# Patient Record
Sex: Female | Born: 1935 | Race: Black or African American | Hispanic: No | Smoking: Never smoker
Health system: Southern US, Community
[De-identification: ages and names within clinical notes are randomized; demographics above are authoritative.]

## PROBLEM LIST (undated history)

## (undated) DIAGNOSIS — J449 Chronic obstructive pulmonary disease, unspecified: Secondary | ICD-10-CM

## (undated) DIAGNOSIS — N183 Chronic kidney disease, stage 3 unspecified: Secondary | ICD-10-CM

## (undated) DIAGNOSIS — K229 Disease of esophagus, unspecified: Secondary | ICD-10-CM

## (undated) DIAGNOSIS — F329 Major depressive disorder, single episode, unspecified: Secondary | ICD-10-CM

## (undated) DIAGNOSIS — H409 Unspecified glaucoma: Secondary | ICD-10-CM

## (undated) DIAGNOSIS — J189 Pneumonia, unspecified organism: Secondary | ICD-10-CM

## (undated) DIAGNOSIS — J039 Acute tonsillitis, unspecified: Secondary | ICD-10-CM

## (undated) DIAGNOSIS — J45909 Unspecified asthma, uncomplicated: Secondary | ICD-10-CM

## (undated) DIAGNOSIS — N289 Disorder of kidney and ureter, unspecified: Secondary | ICD-10-CM

## (undated) DIAGNOSIS — I509 Heart failure, unspecified: Secondary | ICD-10-CM

## (undated) DIAGNOSIS — G20 Parkinson's disease: Secondary | ICD-10-CM

## (undated) DIAGNOSIS — M519 Unspecified thoracic, thoracolumbar and lumbosacral intervertebral disc disorder: Secondary | ICD-10-CM

## (undated) DIAGNOSIS — E119 Type 2 diabetes mellitus without complications: Secondary | ICD-10-CM

## (undated) DIAGNOSIS — E785 Hyperlipidemia, unspecified: Secondary | ICD-10-CM

## (undated) DIAGNOSIS — Z9989 Dependence on other enabling machines and devices: Secondary | ICD-10-CM

## (undated) DIAGNOSIS — I209 Angina pectoris, unspecified: Secondary | ICD-10-CM

## (undated) DIAGNOSIS — I214 Non-ST elevation (NSTEMI) myocardial infarction: Secondary | ICD-10-CM

## (undated) DIAGNOSIS — F32A Depression, unspecified: Secondary | ICD-10-CM

## (undated) DIAGNOSIS — I350 Nonrheumatic aortic (valve) stenosis: Secondary | ICD-10-CM

## (undated) DIAGNOSIS — K219 Gastro-esophageal reflux disease without esophagitis: Secondary | ICD-10-CM

## (undated) DIAGNOSIS — G4733 Obstructive sleep apnea (adult) (pediatric): Secondary | ICD-10-CM

## (undated) DIAGNOSIS — G20C Parkinsonism, unspecified: Secondary | ICD-10-CM

## (undated) DIAGNOSIS — R011 Cardiac murmur, unspecified: Secondary | ICD-10-CM

## (undated) DIAGNOSIS — M797 Fibromyalgia: Secondary | ICD-10-CM

## (undated) DIAGNOSIS — I447 Left bundle-branch block, unspecified: Secondary | ICD-10-CM

## (undated) DIAGNOSIS — N920 Excessive and frequent menstruation with regular cycle: Secondary | ICD-10-CM

## (undated) DIAGNOSIS — M1990 Unspecified osteoarthritis, unspecified site: Secondary | ICD-10-CM

## (undated) DIAGNOSIS — D649 Anemia, unspecified: Secondary | ICD-10-CM

## (undated) DIAGNOSIS — I10 Essential (primary) hypertension: Secondary | ICD-10-CM

## (undated) HISTORY — DX: Acute tonsillitis, unspecified: J03.90

## (undated) HISTORY — DX: Unspecified glaucoma: H40.9

## (undated) HISTORY — DX: Fibromyalgia: M79.7

## (undated) HISTORY — PX: DILATION AND CURETTAGE OF UTERUS: SHX78

## (undated) HISTORY — DX: Disease of esophagus, unspecified: K22.9

## (undated) HISTORY — PX: CARPAL TUNNEL RELEASE: SHX101

## (undated) HISTORY — DX: Essential (primary) hypertension: I10

## (undated) HISTORY — PX: LUMBAR LAMINECTOMY: SHX95

## (undated) HISTORY — DX: Parkinsonism, unspecified: G20.C

## (undated) HISTORY — DX: Unspecified thoracic, thoracolumbar and lumbosacral intervertebral disc disorder: M51.9

## (undated) HISTORY — DX: Unspecified asthma, uncomplicated: J45.909

## (undated) HISTORY — DX: Disorder of kidney and ureter, unspecified: N28.9

## (undated) HISTORY — DX: Hyperlipidemia, unspecified: E78.5

## (undated) HISTORY — PX: BACK SURGERY: SHX140

## (undated) HISTORY — DX: Nonrheumatic aortic (valve) stenosis: I35.0

## (undated) HISTORY — DX: Excessive and frequent menstruation with regular cycle: N92.0

## (undated) HISTORY — DX: Parkinson's disease: G20

## (undated) HISTORY — PX: CATARACT EXTRACTION W/ INTRAOCULAR LENS  IMPLANT, BILATERAL: SHX1307

---
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
## 1967-12-14 HISTORY — PX: ABDOMINAL HYSTERECTOMY: SHX81

## 2006-12-13 HISTORY — PX: COLONOSCOPY: SHX174

## 2007-12-14 DIAGNOSIS — H409 Unspecified glaucoma: Secondary | ICD-10-CM

## 2007-12-14 HISTORY — DX: Unspecified glaucoma: H40.9

## 2014-12-16 DIAGNOSIS — G4733 Obstructive sleep apnea (adult) (pediatric): Secondary | ICD-10-CM | POA: Diagnosis not present

## 2014-12-16 DIAGNOSIS — I4891 Unspecified atrial fibrillation: Secondary | ICD-10-CM | POA: Diagnosis not present

## 2014-12-16 DIAGNOSIS — H2512 Age-related nuclear cataract, left eye: Secondary | ICD-10-CM | POA: Diagnosis not present

## 2014-12-16 DIAGNOSIS — I209 Angina pectoris, unspecified: Secondary | ICD-10-CM | POA: Diagnosis not present

## 2014-12-17 DIAGNOSIS — H2512 Age-related nuclear cataract, left eye: Secondary | ICD-10-CM | POA: Diagnosis not present

## 2015-01-08 DIAGNOSIS — E119 Type 2 diabetes mellitus without complications: Secondary | ICD-10-CM | POA: Diagnosis not present

## 2015-01-08 DIAGNOSIS — M545 Low back pain: Secondary | ICD-10-CM | POA: Diagnosis not present

## 2015-01-08 DIAGNOSIS — M5126 Other intervertebral disc displacement, lumbar region: Secondary | ICD-10-CM | POA: Diagnosis not present

## 2015-01-08 DIAGNOSIS — I129 Hypertensive chronic kidney disease with stage 1 through stage 4 chronic kidney disease, or unspecified chronic kidney disease: Secondary | ICD-10-CM | POA: Diagnosis not present

## 2015-01-08 DIAGNOSIS — M4126 Other idiopathic scoliosis, lumbar region: Secondary | ICD-10-CM | POA: Diagnosis not present

## 2015-01-08 DIAGNOSIS — M47816 Spondylosis without myelopathy or radiculopathy, lumbar region: Secondary | ICD-10-CM | POA: Diagnosis not present

## 2015-01-08 DIAGNOSIS — M5416 Radiculopathy, lumbar region: Secondary | ICD-10-CM | POA: Diagnosis not present

## 2015-01-08 DIAGNOSIS — N183 Chronic kidney disease, stage 3 (moderate): Secondary | ICD-10-CM | POA: Diagnosis not present

## 2015-01-08 DIAGNOSIS — N189 Chronic kidney disease, unspecified: Secondary | ICD-10-CM | POA: Diagnosis not present

## 2015-01-29 DIAGNOSIS — M5126 Other intervertebral disc displacement, lumbar region: Secondary | ICD-10-CM | POA: Diagnosis not present

## 2015-01-29 DIAGNOSIS — M545 Low back pain: Secondary | ICD-10-CM | POA: Diagnosis not present

## 2015-01-31 DIAGNOSIS — M5126 Other intervertebral disc displacement, lumbar region: Secondary | ICD-10-CM | POA: Diagnosis not present

## 2015-01-31 DIAGNOSIS — M545 Low back pain: Secondary | ICD-10-CM | POA: Diagnosis not present

## 2015-02-03 DIAGNOSIS — I10 Essential (primary) hypertension: Secondary | ICD-10-CM | POA: Diagnosis not present

## 2015-02-03 DIAGNOSIS — E119 Type 2 diabetes mellitus without complications: Secondary | ICD-10-CM | POA: Diagnosis not present

## 2015-02-04 DIAGNOSIS — I10 Essential (primary) hypertension: Secondary | ICD-10-CM | POA: Diagnosis not present

## 2015-02-04 DIAGNOSIS — Z0000 Encounter for general adult medical examination without abnormal findings: Secondary | ICD-10-CM | POA: Diagnosis not present

## 2015-02-04 DIAGNOSIS — E1122 Type 2 diabetes mellitus with diabetic chronic kidney disease: Secondary | ICD-10-CM | POA: Diagnosis not present

## 2015-02-04 DIAGNOSIS — E780 Pure hypercholesterolemia: Secondary | ICD-10-CM | POA: Diagnosis not present

## 2015-02-04 DIAGNOSIS — G4710 Hypersomnia, unspecified: Secondary | ICD-10-CM | POA: Diagnosis not present

## 2015-02-05 DIAGNOSIS — M545 Low back pain: Secondary | ICD-10-CM | POA: Diagnosis not present

## 2015-02-05 DIAGNOSIS — M5126 Other intervertebral disc displacement, lumbar region: Secondary | ICD-10-CM | POA: Diagnosis not present

## 2015-02-07 DIAGNOSIS — M5126 Other intervertebral disc displacement, lumbar region: Secondary | ICD-10-CM | POA: Diagnosis not present

## 2015-02-07 DIAGNOSIS — M545 Low back pain: Secondary | ICD-10-CM | POA: Diagnosis not present

## 2015-02-11 DIAGNOSIS — M5126 Other intervertebral disc displacement, lumbar region: Secondary | ICD-10-CM | POA: Diagnosis not present

## 2015-02-11 DIAGNOSIS — M545 Low back pain: Secondary | ICD-10-CM | POA: Diagnosis not present

## 2015-02-13 DIAGNOSIS — M5126 Other intervertebral disc displacement, lumbar region: Secondary | ICD-10-CM | POA: Diagnosis not present

## 2015-02-13 DIAGNOSIS — M545 Low back pain: Secondary | ICD-10-CM | POA: Diagnosis not present

## 2015-02-17 DIAGNOSIS — M545 Low back pain: Secondary | ICD-10-CM | POA: Diagnosis not present

## 2015-02-17 DIAGNOSIS — M5126 Other intervertebral disc displacement, lumbar region: Secondary | ICD-10-CM | POA: Diagnosis not present

## 2015-02-18 DIAGNOSIS — H2513 Age-related nuclear cataract, bilateral: Secondary | ICD-10-CM | POA: Diagnosis not present

## 2015-02-20 DIAGNOSIS — M170 Bilateral primary osteoarthritis of knee: Secondary | ICD-10-CM | POA: Diagnosis not present

## 2015-02-20 DIAGNOSIS — G5600 Carpal tunnel syndrome, unspecified upper limb: Secondary | ICD-10-CM | POA: Diagnosis not present

## 2015-02-21 DIAGNOSIS — M5126 Other intervertebral disc displacement, lumbar region: Secondary | ICD-10-CM | POA: Diagnosis not present

## 2015-02-21 DIAGNOSIS — M545 Low back pain: Secondary | ICD-10-CM | POA: Diagnosis not present

## 2015-03-07 DIAGNOSIS — M47816 Spondylosis without myelopathy or radiculopathy, lumbar region: Secondary | ICD-10-CM | POA: Diagnosis not present

## 2015-03-07 DIAGNOSIS — M5416 Radiculopathy, lumbar region: Secondary | ICD-10-CM | POA: Diagnosis not present

## 2015-03-07 DIAGNOSIS — M545 Low back pain: Secondary | ICD-10-CM | POA: Diagnosis not present

## 2015-03-07 DIAGNOSIS — M4126 Other idiopathic scoliosis, lumbar region: Secondary | ICD-10-CM | POA: Diagnosis not present

## 2015-03-07 DIAGNOSIS — M5126 Other intervertebral disc displacement, lumbar region: Secondary | ICD-10-CM | POA: Diagnosis not present

## 2015-03-17 DIAGNOSIS — H4011X2 Primary open-angle glaucoma, moderate stage: Secondary | ICD-10-CM | POA: Diagnosis not present

## 2015-03-17 DIAGNOSIS — H04123 Dry eye syndrome of bilateral lacrimal glands: Secondary | ICD-10-CM | POA: Diagnosis not present

## 2015-06-23 DIAGNOSIS — H4011X2 Primary open-angle glaucoma, moderate stage: Secondary | ICD-10-CM | POA: Diagnosis not present

## 2015-08-05 DIAGNOSIS — E780 Pure hypercholesterolemia: Secondary | ICD-10-CM | POA: Diagnosis not present

## 2015-08-05 DIAGNOSIS — E1122 Type 2 diabetes mellitus with diabetic chronic kidney disease: Secondary | ICD-10-CM | POA: Diagnosis not present

## 2015-08-05 DIAGNOSIS — I10 Essential (primary) hypertension: Secondary | ICD-10-CM | POA: Diagnosis not present

## 2015-08-05 DIAGNOSIS — N289 Disorder of kidney and ureter, unspecified: Secondary | ICD-10-CM | POA: Diagnosis not present

## 2015-08-08 DIAGNOSIS — I272 Other secondary pulmonary hypertension: Secondary | ICD-10-CM | POA: Diagnosis not present

## 2015-08-08 DIAGNOSIS — R0602 Shortness of breath: Secondary | ICD-10-CM | POA: Diagnosis not present

## 2015-08-08 DIAGNOSIS — E785 Hyperlipidemia, unspecified: Secondary | ICD-10-CM | POA: Diagnosis not present

## 2015-08-08 DIAGNOSIS — I10 Essential (primary) hypertension: Secondary | ICD-10-CM | POA: Diagnosis not present

## 2015-08-08 DIAGNOSIS — E1129 Type 2 diabetes mellitus with other diabetic kidney complication: Secondary | ICD-10-CM | POA: Diagnosis not present

## 2015-08-11 DIAGNOSIS — R931 Abnormal findings on diagnostic imaging of heart and coronary circulation: Secondary | ICD-10-CM | POA: Diagnosis not present

## 2015-08-11 DIAGNOSIS — R0602 Shortness of breath: Secondary | ICD-10-CM | POA: Diagnosis not present

## 2015-09-26 DIAGNOSIS — Z23 Encounter for immunization: Secondary | ICD-10-CM | POA: Diagnosis not present

## 2015-10-17 DIAGNOSIS — H401132 Primary open-angle glaucoma, bilateral, moderate stage: Secondary | ICD-10-CM | POA: Diagnosis not present

## 2015-10-17 DIAGNOSIS — H47233 Glaucomatous optic atrophy, bilateral: Secondary | ICD-10-CM | POA: Diagnosis not present

## 2015-10-20 DIAGNOSIS — E119 Type 2 diabetes mellitus without complications: Secondary | ICD-10-CM | POA: Diagnosis not present

## 2015-10-20 DIAGNOSIS — H401132 Primary open-angle glaucoma, bilateral, moderate stage: Secondary | ICD-10-CM | POA: Diagnosis not present

## 2015-10-31 DIAGNOSIS — D649 Anemia, unspecified: Secondary | ICD-10-CM | POA: Diagnosis not present

## 2015-10-31 DIAGNOSIS — E119 Type 2 diabetes mellitus without complications: Secondary | ICD-10-CM | POA: Diagnosis not present

## 2015-10-31 DIAGNOSIS — I129 Hypertensive chronic kidney disease with stage 1 through stage 4 chronic kidney disease, or unspecified chronic kidney disease: Secondary | ICD-10-CM | POA: Diagnosis not present

## 2015-10-31 DIAGNOSIS — N183 Chronic kidney disease, stage 3 (moderate): Secondary | ICD-10-CM | POA: Diagnosis not present

## 2015-11-12 DIAGNOSIS — E782 Mixed hyperlipidemia: Secondary | ICD-10-CM | POA: Diagnosis not present

## 2015-11-12 DIAGNOSIS — I129 Hypertensive chronic kidney disease with stage 1 through stage 4 chronic kidney disease, or unspecified chronic kidney disease: Secondary | ICD-10-CM | POA: Diagnosis not present

## 2015-11-12 DIAGNOSIS — E559 Vitamin D deficiency, unspecified: Secondary | ICD-10-CM | POA: Diagnosis not present

## 2015-11-12 DIAGNOSIS — M519 Unspecified thoracic, thoracolumbar and lumbosacral intervertebral disc disorder: Secondary | ICD-10-CM | POA: Diagnosis not present

## 2015-11-12 DIAGNOSIS — N183 Chronic kidney disease, stage 3 (moderate): Secondary | ICD-10-CM | POA: Diagnosis not present

## 2015-11-12 DIAGNOSIS — D649 Anemia, unspecified: Secondary | ICD-10-CM | POA: Diagnosis not present

## 2015-11-12 DIAGNOSIS — E119 Type 2 diabetes mellitus without complications: Secondary | ICD-10-CM | POA: Diagnosis not present

## 2015-11-12 DIAGNOSIS — N189 Chronic kidney disease, unspecified: Secondary | ICD-10-CM | POA: Diagnosis not present

## 2015-11-14 DIAGNOSIS — H26493 Other secondary cataract, bilateral: Secondary | ICD-10-CM | POA: Diagnosis not present

## 2015-11-14 DIAGNOSIS — H401130 Primary open-angle glaucoma, bilateral, stage unspecified: Secondary | ICD-10-CM | POA: Diagnosis not present

## 2015-11-14 DIAGNOSIS — E119 Type 2 diabetes mellitus without complications: Secondary | ICD-10-CM | POA: Diagnosis not present

## 2015-11-14 DIAGNOSIS — H35373 Puckering of macula, bilateral: Secondary | ICD-10-CM | POA: Diagnosis not present

## 2015-11-25 DIAGNOSIS — J4550 Severe persistent asthma, uncomplicated: Secondary | ICD-10-CM | POA: Diagnosis not present

## 2015-11-25 DIAGNOSIS — R5383 Other fatigue: Secondary | ICD-10-CM | POA: Diagnosis not present

## 2015-11-25 DIAGNOSIS — G4733 Obstructive sleep apnea (adult) (pediatric): Secondary | ICD-10-CM | POA: Diagnosis not present

## 2015-12-01 DIAGNOSIS — H26493 Other secondary cataract, bilateral: Secondary | ICD-10-CM | POA: Diagnosis not present

## 2015-12-18 DIAGNOSIS — Z1321 Encounter for screening for nutritional disorder: Secondary | ICD-10-CM | POA: Diagnosis not present

## 2015-12-18 DIAGNOSIS — E559 Vitamin D deficiency, unspecified: Secondary | ICD-10-CM | POA: Diagnosis not present

## 2015-12-18 DIAGNOSIS — R9431 Abnormal electrocardiogram [ECG] [EKG]: Secondary | ICD-10-CM | POA: Diagnosis not present

## 2015-12-18 DIAGNOSIS — R5383 Other fatigue: Secondary | ICD-10-CM | POA: Diagnosis not present

## 2015-12-18 DIAGNOSIS — F3289 Other specified depressive episodes: Secondary | ICD-10-CM | POA: Diagnosis not present

## 2016-01-02 DIAGNOSIS — E559 Vitamin D deficiency, unspecified: Secondary | ICD-10-CM | POA: Diagnosis not present

## 2016-01-02 DIAGNOSIS — N183 Chronic kidney disease, stage 3 (moderate): Secondary | ICD-10-CM | POA: Diagnosis not present

## 2016-01-02 DIAGNOSIS — E119 Type 2 diabetes mellitus without complications: Secondary | ICD-10-CM | POA: Diagnosis not present

## 2016-01-07 DIAGNOSIS — N189 Chronic kidney disease, unspecified: Secondary | ICD-10-CM | POA: Diagnosis not present

## 2016-01-07 DIAGNOSIS — N183 Chronic kidney disease, stage 3 (moderate): Secondary | ICD-10-CM | POA: Diagnosis not present

## 2016-01-07 DIAGNOSIS — I129 Hypertensive chronic kidney disease with stage 1 through stage 4 chronic kidney disease, or unspecified chronic kidney disease: Secondary | ICD-10-CM | POA: Diagnosis not present

## 2016-01-07 DIAGNOSIS — E119 Type 2 diabetes mellitus without complications: Secondary | ICD-10-CM | POA: Diagnosis not present

## 2016-02-09 DIAGNOSIS — N289 Disorder of kidney and ureter, unspecified: Secondary | ICD-10-CM | POA: Diagnosis not present

## 2016-02-09 DIAGNOSIS — I10 Essential (primary) hypertension: Secondary | ICD-10-CM | POA: Diagnosis not present

## 2016-02-09 DIAGNOSIS — E785 Hyperlipidemia, unspecified: Secondary | ICD-10-CM | POA: Diagnosis not present

## 2016-02-10 DIAGNOSIS — E7800 Pure hypercholesterolemia, unspecified: Secondary | ICD-10-CM | POA: Diagnosis not present

## 2016-02-10 DIAGNOSIS — D638 Anemia in other chronic diseases classified elsewhere: Secondary | ICD-10-CM | POA: Diagnosis not present

## 2016-02-10 DIAGNOSIS — Z0000 Encounter for general adult medical examination without abnormal findings: Secondary | ICD-10-CM | POA: Diagnosis not present

## 2016-02-10 DIAGNOSIS — I10 Essential (primary) hypertension: Secondary | ICD-10-CM | POA: Diagnosis not present

## 2016-02-10 DIAGNOSIS — M79671 Pain in right foot: Secondary | ICD-10-CM | POA: Diagnosis not present

## 2016-02-10 DIAGNOSIS — S9031XA Contusion of right foot, initial encounter: Secondary | ICD-10-CM | POA: Diagnosis not present

## 2016-02-10 DIAGNOSIS — Z6828 Body mass index (BMI) 28.0-28.9, adult: Secondary | ICD-10-CM | POA: Diagnosis not present

## 2016-02-10 DIAGNOSIS — E119 Type 2 diabetes mellitus without complications: Secondary | ICD-10-CM | POA: Diagnosis not present

## 2016-02-10 DIAGNOSIS — S99921A Unspecified injury of right foot, initial encounter: Secondary | ICD-10-CM | POA: Diagnosis not present

## 2016-02-10 DIAGNOSIS — I350 Nonrheumatic aortic (valve) stenosis: Secondary | ICD-10-CM | POA: Diagnosis not present

## 2016-02-10 DIAGNOSIS — N289 Disorder of kidney and ureter, unspecified: Secondary | ICD-10-CM | POA: Diagnosis not present

## 2016-03-08 DIAGNOSIS — H401132 Primary open-angle glaucoma, bilateral, moderate stage: Secondary | ICD-10-CM | POA: Diagnosis not present

## 2016-03-09 DIAGNOSIS — Z23 Encounter for immunization: Secondary | ICD-10-CM | POA: Diagnosis not present

## 2016-03-09 DIAGNOSIS — R5383 Other fatigue: Secondary | ICD-10-CM | POA: Diagnosis not present

## 2016-03-09 DIAGNOSIS — G4733 Obstructive sleep apnea (adult) (pediatric): Secondary | ICD-10-CM | POA: Diagnosis not present

## 2016-03-09 DIAGNOSIS — R0602 Shortness of breath: Secondary | ICD-10-CM | POA: Diagnosis not present

## 2016-03-09 DIAGNOSIS — J4540 Moderate persistent asthma, uncomplicated: Secondary | ICD-10-CM | POA: Diagnosis not present

## 2016-04-30 DIAGNOSIS — E559 Vitamin D deficiency, unspecified: Secondary | ICD-10-CM | POA: Diagnosis not present

## 2016-04-30 DIAGNOSIS — N183 Chronic kidney disease, stage 3 (moderate): Secondary | ICD-10-CM | POA: Diagnosis not present

## 2016-04-30 DIAGNOSIS — E119 Type 2 diabetes mellitus without complications: Secondary | ICD-10-CM | POA: Diagnosis not present

## 2016-05-05 DIAGNOSIS — N183 Chronic kidney disease, stage 3 (moderate): Secondary | ICD-10-CM | POA: Diagnosis not present

## 2016-05-05 DIAGNOSIS — I129 Hypertensive chronic kidney disease with stage 1 through stage 4 chronic kidney disease, or unspecified chronic kidney disease: Secondary | ICD-10-CM | POA: Diagnosis not present

## 2016-05-05 DIAGNOSIS — E119 Type 2 diabetes mellitus without complications: Secondary | ICD-10-CM | POA: Diagnosis not present

## 2016-05-05 DIAGNOSIS — N189 Chronic kidney disease, unspecified: Secondary | ICD-10-CM | POA: Diagnosis not present

## 2016-06-02 DIAGNOSIS — E119 Type 2 diabetes mellitus without complications: Secondary | ICD-10-CM | POA: Diagnosis not present

## 2016-06-02 DIAGNOSIS — I272 Other secondary pulmonary hypertension: Secondary | ICD-10-CM | POA: Diagnosis not present

## 2016-06-02 DIAGNOSIS — I110 Hypertensive heart disease with heart failure: Secondary | ICD-10-CM | POA: Diagnosis not present

## 2016-06-02 DIAGNOSIS — D649 Anemia, unspecified: Secondary | ICD-10-CM | POA: Diagnosis not present

## 2016-06-02 DIAGNOSIS — E785 Hyperlipidemia, unspecified: Secondary | ICD-10-CM | POA: Diagnosis not present

## 2016-06-02 DIAGNOSIS — Z6828 Body mass index (BMI) 28.0-28.9, adult: Secondary | ICD-10-CM | POA: Diagnosis not present

## 2016-06-02 DIAGNOSIS — N289 Disorder of kidney and ureter, unspecified: Secondary | ICD-10-CM | POA: Diagnosis not present

## 2016-06-04 DIAGNOSIS — I110 Hypertensive heart disease with heart failure: Secondary | ICD-10-CM | POA: Diagnosis not present

## 2016-06-04 DIAGNOSIS — R601 Generalized edema: Secondary | ICD-10-CM | POA: Diagnosis not present

## 2016-06-17 DIAGNOSIS — D649 Anemia, unspecified: Secondary | ICD-10-CM | POA: Diagnosis not present

## 2016-06-23 DIAGNOSIS — Z87448 Personal history of other diseases of urinary system: Secondary | ICD-10-CM | POA: Diagnosis not present

## 2016-06-23 DIAGNOSIS — Z8739 Personal history of other diseases of the musculoskeletal system and connective tissue: Secondary | ICD-10-CM | POA: Diagnosis not present

## 2016-06-23 DIAGNOSIS — D649 Anemia, unspecified: Secondary | ICD-10-CM | POA: Diagnosis not present

## 2016-07-06 DIAGNOSIS — H401132 Primary open-angle glaucoma, bilateral, moderate stage: Secondary | ICD-10-CM | POA: Diagnosis not present

## 2016-07-06 DIAGNOSIS — H04123 Dry eye syndrome of bilateral lacrimal glands: Secondary | ICD-10-CM | POA: Diagnosis not present

## 2016-07-06 DIAGNOSIS — R931 Abnormal findings on diagnostic imaging of heart and coronary circulation: Secondary | ICD-10-CM | POA: Diagnosis not present

## 2016-07-06 DIAGNOSIS — E1169 Type 2 diabetes mellitus with other specified complication: Secondary | ICD-10-CM | POA: Diagnosis not present

## 2016-08-13 DIAGNOSIS — D649 Anemia, unspecified: Secondary | ICD-10-CM | POA: Diagnosis not present

## 2016-08-18 DIAGNOSIS — D649 Anemia, unspecified: Secondary | ICD-10-CM | POA: Diagnosis not present

## 2016-09-14 DIAGNOSIS — J4550 Severe persistent asthma, uncomplicated: Secondary | ICD-10-CM | POA: Diagnosis not present

## 2016-09-14 DIAGNOSIS — G4733 Obstructive sleep apnea (adult) (pediatric): Secondary | ICD-10-CM | POA: Diagnosis not present

## 2016-09-14 DIAGNOSIS — R0989 Other specified symptoms and signs involving the circulatory and respiratory systems: Secondary | ICD-10-CM | POA: Diagnosis not present

## 2016-09-14 DIAGNOSIS — Z23 Encounter for immunization: Secondary | ICD-10-CM | POA: Diagnosis not present

## 2016-09-14 DIAGNOSIS — M25552 Pain in left hip: Secondary | ICD-10-CM | POA: Diagnosis not present

## 2016-09-14 DIAGNOSIS — I509 Heart failure, unspecified: Secondary | ICD-10-CM | POA: Diagnosis not present

## 2016-09-14 DIAGNOSIS — M79672 Pain in left foot: Secondary | ICD-10-CM | POA: Diagnosis not present

## 2016-09-14 DIAGNOSIS — M1990 Unspecified osteoarthritis, unspecified site: Secondary | ICD-10-CM | POA: Diagnosis not present

## 2016-09-16 DIAGNOSIS — E119 Type 2 diabetes mellitus without complications: Secondary | ICD-10-CM | POA: Diagnosis not present

## 2016-09-16 DIAGNOSIS — E782 Mixed hyperlipidemia: Secondary | ICD-10-CM | POA: Diagnosis not present

## 2016-09-16 DIAGNOSIS — D638 Anemia in other chronic diseases classified elsewhere: Secondary | ICD-10-CM | POA: Diagnosis not present

## 2016-09-16 DIAGNOSIS — I10 Essential (primary) hypertension: Secondary | ICD-10-CM | POA: Diagnosis not present

## 2016-09-16 LAB — BASIC METABOLIC PANEL
BUN: 53 — AB (ref 4–21)
CREATININE: 1.6 — AB (ref 0.5–1.1)
GLUCOSE: 88
Potassium: 3.9 (ref 3.4–5.3)
SODIUM: 139 (ref 137–147)

## 2016-09-16 LAB — LIPID PANEL
Cholesterol: 143 (ref 0–200)
HDL: 2 — AB (ref 35–70)
LDL CALC: 73
Triglycerides: 44 (ref 40–160)

## 2016-09-16 LAB — CBC AND DIFFERENTIAL
HEMATOCRIT: 32 — AB (ref 36–46)
Hemoglobin: 10.1 — AB (ref 12.0–16.0)
Platelets: 186 (ref 150–399)
WBC: 10

## 2016-09-16 LAB — HEPATIC FUNCTION PANEL
ALK PHOS: 124 (ref 25–125)
ALT: 24 (ref 7–35)
AST: 28 (ref 13–35)
BILIRUBIN, TOTAL: 0.3

## 2016-09-16 LAB — HEMOGLOBIN A1C: Hemoglobin A1C: 5.7

## 2016-09-17 DIAGNOSIS — R0602 Shortness of breath: Secondary | ICD-10-CM | POA: Diagnosis not present

## 2016-09-17 DIAGNOSIS — M25552 Pain in left hip: Secondary | ICD-10-CM | POA: Diagnosis not present

## 2016-09-17 DIAGNOSIS — N289 Disorder of kidney and ureter, unspecified: Secondary | ICD-10-CM | POA: Diagnosis not present

## 2016-09-17 DIAGNOSIS — Z6828 Body mass index (BMI) 28.0-28.9, adult: Secondary | ICD-10-CM | POA: Diagnosis not present

## 2016-09-17 DIAGNOSIS — I10 Essential (primary) hypertension: Secondary | ICD-10-CM | POA: Diagnosis not present

## 2016-09-17 DIAGNOSIS — R601 Generalized edema: Secondary | ICD-10-CM | POA: Diagnosis not present

## 2016-09-17 DIAGNOSIS — I272 Pulmonary hypertension, unspecified: Secondary | ICD-10-CM | POA: Diagnosis not present

## 2016-09-17 DIAGNOSIS — Z0130 Encounter for examination of blood pressure without abnormal findings: Secondary | ICD-10-CM | POA: Diagnosis not present

## 2016-09-17 DIAGNOSIS — E119 Type 2 diabetes mellitus without complications: Secondary | ICD-10-CM | POA: Diagnosis not present

## 2016-09-17 DIAGNOSIS — M25562 Pain in left knee: Secondary | ICD-10-CM | POA: Diagnosis not present

## 2016-09-17 DIAGNOSIS — I110 Hypertensive heart disease with heart failure: Secondary | ICD-10-CM | POA: Diagnosis not present

## 2016-09-17 DIAGNOSIS — E785 Hyperlipidemia, unspecified: Secondary | ICD-10-CM | POA: Diagnosis not present

## 2016-09-24 DIAGNOSIS — R601 Generalized edema: Secondary | ICD-10-CM | POA: Diagnosis not present

## 2016-09-24 DIAGNOSIS — I110 Hypertensive heart disease with heart failure: Secondary | ICD-10-CM | POA: Diagnosis not present

## 2016-10-15 DIAGNOSIS — H401132 Primary open-angle glaucoma, bilateral, moderate stage: Secondary | ICD-10-CM | POA: Diagnosis not present

## 2016-10-15 DIAGNOSIS — H04123 Dry eye syndrome of bilateral lacrimal glands: Secondary | ICD-10-CM | POA: Diagnosis not present

## 2016-11-29 ENCOUNTER — Ambulatory Visit: Payer: Self-pay | Admitting: Family Medicine

## 2016-12-02 ENCOUNTER — Ambulatory Visit: Payer: Self-pay | Admitting: Family Medicine

## 2017-01-01 ENCOUNTER — Inpatient Hospital Stay (HOSPITAL_COMMUNITY)
Admission: EM | Admit: 2017-01-01 | Discharge: 2017-01-08 | DRG: 280 | Disposition: A | Discharge status: Skilled Nursing Facility | Payer: Medicare Other | Attending: Internal Medicine | Admitting: Internal Medicine

## 2017-01-01 ENCOUNTER — Emergency Department (HOSPITAL_COMMUNITY): Payer: Medicare Other

## 2017-01-01 ENCOUNTER — Inpatient Hospital Stay (HOSPITAL_COMMUNITY): Payer: Medicare Other

## 2017-01-01 ENCOUNTER — Encounter (HOSPITAL_COMMUNITY): Payer: Self-pay | Admitting: Family Medicine

## 2017-01-01 DIAGNOSIS — D72829 Elevated white blood cell count, unspecified: Secondary | ICD-10-CM | POA: Diagnosis present

## 2017-01-01 DIAGNOSIS — I425 Other restrictive cardiomyopathy: Secondary | ICD-10-CM | POA: Diagnosis present

## 2017-01-01 DIAGNOSIS — I2729 Other secondary pulmonary hypertension: Secondary | ICD-10-CM | POA: Diagnosis present

## 2017-01-01 DIAGNOSIS — N179 Acute kidney failure, unspecified: Secondary | ICD-10-CM | POA: Diagnosis present

## 2017-01-01 DIAGNOSIS — E872 Acidosis: Secondary | ICD-10-CM | POA: Diagnosis present

## 2017-01-01 DIAGNOSIS — I214 Non-ST elevation (NSTEMI) myocardial infarction: Principal | ICD-10-CM | POA: Diagnosis present

## 2017-01-01 DIAGNOSIS — I471 Supraventricular tachycardia: Secondary | ICD-10-CM | POA: Diagnosis not present

## 2017-01-01 DIAGNOSIS — R069 Unspecified abnormalities of breathing: Secondary | ICD-10-CM | POA: Diagnosis not present

## 2017-01-01 DIAGNOSIS — J189 Pneumonia, unspecified organism: Secondary | ICD-10-CM | POA: Diagnosis not present

## 2017-01-01 DIAGNOSIS — R0600 Dyspnea, unspecified: Secondary | ICD-10-CM

## 2017-01-01 DIAGNOSIS — E785 Hyperlipidemia, unspecified: Secondary | ICD-10-CM | POA: Diagnosis present

## 2017-01-01 DIAGNOSIS — D519 Vitamin B12 deficiency anemia, unspecified: Secondary | ICD-10-CM | POA: Diagnosis present

## 2017-01-01 DIAGNOSIS — I371 Nonrheumatic pulmonary valve insufficiency: Secondary | ICD-10-CM | POA: Diagnosis present

## 2017-01-01 DIAGNOSIS — I081 Rheumatic disorders of both mitral and tricuspid valves: Secondary | ICD-10-CM | POA: Diagnosis present

## 2017-01-01 DIAGNOSIS — E1122 Type 2 diabetes mellitus with diabetic chronic kidney disease: Secondary | ICD-10-CM | POA: Diagnosis present

## 2017-01-01 DIAGNOSIS — Z79899 Other long term (current) drug therapy: Secondary | ICD-10-CM

## 2017-01-01 DIAGNOSIS — R748 Abnormal levels of other serum enzymes: Secondary | ICD-10-CM | POA: Diagnosis not present

## 2017-01-01 DIAGNOSIS — Z7982 Long term (current) use of aspirin: Secondary | ICD-10-CM

## 2017-01-01 DIAGNOSIS — I272 Pulmonary hypertension, unspecified: Secondary | ICD-10-CM | POA: Diagnosis present

## 2017-01-01 DIAGNOSIS — I447 Left bundle-branch block, unspecified: Secondary | ICD-10-CM | POA: Diagnosis present

## 2017-01-01 DIAGNOSIS — Z87891 Personal history of nicotine dependence: Secondary | ICD-10-CM

## 2017-01-01 DIAGNOSIS — R140 Abdominal distension (gaseous): Secondary | ICD-10-CM | POA: Diagnosis not present

## 2017-01-01 DIAGNOSIS — R7989 Other specified abnormal findings of blood chemistry: Secondary | ICD-10-CM

## 2017-01-01 DIAGNOSIS — I5082 Biventricular heart failure: Secondary | ICD-10-CM | POA: Diagnosis present

## 2017-01-01 DIAGNOSIS — R042 Hemoptysis: Secondary | ICD-10-CM | POA: Diagnosis not present

## 2017-01-01 DIAGNOSIS — I5041 Acute combined systolic (congestive) and diastolic (congestive) heart failure: Secondary | ICD-10-CM | POA: Diagnosis present

## 2017-01-01 DIAGNOSIS — R0602 Shortness of breath: Secondary | ICD-10-CM | POA: Diagnosis not present

## 2017-01-01 DIAGNOSIS — N183 Chronic kidney disease, stage 3 (moderate): Secondary | ICD-10-CM | POA: Diagnosis present

## 2017-01-01 DIAGNOSIS — Z794 Long term (current) use of insulin: Secondary | ICD-10-CM

## 2017-01-01 DIAGNOSIS — I130 Hypertensive heart and chronic kidney disease with heart failure and stage 1 through stage 4 chronic kidney disease, or unspecified chronic kidney disease: Secondary | ICD-10-CM | POA: Diagnosis not present

## 2017-01-01 DIAGNOSIS — I420 Dilated cardiomyopathy: Secondary | ICD-10-CM | POA: Diagnosis present

## 2017-01-01 DIAGNOSIS — N189 Chronic kidney disease, unspecified: Secondary | ICD-10-CM | POA: Diagnosis not present

## 2017-01-01 DIAGNOSIS — J441 Chronic obstructive pulmonary disease with (acute) exacerbation: Secondary | ICD-10-CM | POA: Diagnosis not present

## 2017-01-01 DIAGNOSIS — I119 Hypertensive heart disease without heart failure: Secondary | ICD-10-CM | POA: Diagnosis present

## 2017-01-01 DIAGNOSIS — J9601 Acute respiratory failure with hypoxia: Secondary | ICD-10-CM | POA: Diagnosis present

## 2017-01-01 DIAGNOSIS — R778 Other specified abnormalities of plasma proteins: Secondary | ICD-10-CM | POA: Diagnosis present

## 2017-01-01 DIAGNOSIS — J9602 Acute respiratory failure with hypercapnia: Secondary | ICD-10-CM | POA: Diagnosis not present

## 2017-01-01 DIAGNOSIS — I252 Old myocardial infarction: Secondary | ICD-10-CM

## 2017-01-01 DIAGNOSIS — E876 Hypokalemia: Secondary | ICD-10-CM | POA: Diagnosis present

## 2017-01-01 DIAGNOSIS — J9600 Acute respiratory failure, unspecified whether with hypoxia or hypercapnia: Secondary | ICD-10-CM | POA: Diagnosis not present

## 2017-01-01 DIAGNOSIS — I255 Ischemic cardiomyopathy: Secondary | ICD-10-CM | POA: Diagnosis present

## 2017-01-01 DIAGNOSIS — J9690 Respiratory failure, unspecified, unspecified whether with hypoxia or hypercapnia: Secondary | ICD-10-CM | POA: Diagnosis not present

## 2017-01-01 DIAGNOSIS — R339 Retention of urine, unspecified: Secondary | ICD-10-CM | POA: Diagnosis present

## 2017-01-01 DIAGNOSIS — R1310 Dysphagia, unspecified: Secondary | ICD-10-CM | POA: Diagnosis not present

## 2017-01-01 HISTORY — DX: Heart failure, unspecified: I50.9

## 2017-01-01 HISTORY — DX: Chronic obstructive pulmonary disease, unspecified: J44.9

## 2017-01-01 HISTORY — DX: Left bundle-branch block, unspecified: I44.7

## 2017-01-01 LAB — RESPIRATORY PANEL BY PCR
ADENOVIRUS-RVPPCR: NOT DETECTED
Bordetella pertussis: NOT DETECTED
CHLAMYDOPHILA PNEUMONIAE-RVPPCR: NOT DETECTED
CORONAVIRUS HKU1-RVPPCR: NOT DETECTED
CORONAVIRUS NL63-RVPPCR: NOT DETECTED
CORONAVIRUS OC43-RVPPCR: NOT DETECTED
Coronavirus 229E: NOT DETECTED
Influenza A: NOT DETECTED
Influenza B: NOT DETECTED
METAPNEUMOVIRUS-RVPPCR: NOT DETECTED
Mycoplasma pneumoniae: NOT DETECTED
PARAINFLUENZA VIRUS 1-RVPPCR: NOT DETECTED
PARAINFLUENZA VIRUS 2-RVPPCR: NOT DETECTED
PARAINFLUENZA VIRUS 3-RVPPCR: NOT DETECTED
PARAINFLUENZA VIRUS 4-RVPPCR: NOT DETECTED
RHINOVIRUS / ENTEROVIRUS - RVPPCR: NOT DETECTED
Respiratory Syncytial Virus: NOT DETECTED

## 2017-01-01 LAB — I-STAT ARTERIAL BLOOD GAS, ED
ACID-BASE DEFICIT: 4 mmol/L — AB (ref 0.0–2.0)
ACID-BASE DEFICIT: 3 mmol/L — AB (ref 0.0–2.0)
Acid-base deficit: 1 mmol/L (ref 0.0–2.0)
Acid-base deficit: 1 mmol/L (ref 0.0–2.0)
BICARBONATE: 27.7 mmol/L (ref 20.0–28.0)
BICARBONATE: 25.3 mmol/L (ref 20.0–28.0)
Bicarbonate: 25.8 mmol/L (ref 20.0–28.0)
Bicarbonate: 24.8 mmol/L (ref 20.0–28.0)
O2 SAT: 95 %
O2 SAT: 95 %
O2 Saturation: 97 %
O2 Saturation: 99 %
PCO2 ART: 53.3 mmHg — AB (ref 32.0–48.0)
PO2 ART: 86 mmHg (ref 83.0–108.0)
Patient temperature: 95.4
TCO2: 31 mmol/L (ref 0–100)
TCO2: 27 mmol/L (ref 0–100)
TCO2: 27 mmol/L (ref 0–100)
TCO2: 26 mmol/L (ref 0–100)
pCO2 arterial: 95.7 mmHg (ref 32.0–48.0)
pCO2 arterial: 62 mmHg — ABNORMAL HIGH (ref 32.0–48.0)
pCO2 arterial: 41.8 mmHg (ref 32.0–48.0)
pH, Arterial: 7.069 — CL (ref 7.350–7.450)
pH, Arterial: 7.219 — ABNORMAL LOW (ref 7.350–7.450)
pH, Arterial: 7.292 — ABNORMAL LOW (ref 7.350–7.450)
pH, Arterial: 7.374 (ref 7.350–7.450)
pO2, Arterial: 109 mmHg — ABNORMAL HIGH (ref 83.0–108.0)
pO2, Arterial: 118 mmHg — ABNORMAL HIGH (ref 83.0–108.0)
pO2, Arterial: 144 mmHg — ABNORMAL HIGH (ref 83.0–108.0)

## 2017-01-01 LAB — LACTIC ACID, PLASMA: Lactic Acid, Venous: 4.4 mmol/L (ref 0.5–1.9)

## 2017-01-01 LAB — GLUCOSE, CAPILLARY
GLUCOSE-CAPILLARY: 213 mg/dL — AB (ref 65–99)
GLUCOSE-CAPILLARY: 200 mg/dL — AB (ref 65–99)
Glucose-Capillary: 188 mg/dL — ABNORMAL HIGH (ref 65–99)

## 2017-01-01 LAB — CBC WITH DIFFERENTIAL/PLATELET
BASOS ABS: 0.1 10*3/uL (ref 0.0–0.1)
BASOS PCT: 0 %
EOS PCT: 22 %
Eosinophils Absolute: 4.6 10*3/uL — ABNORMAL HIGH (ref 0.0–0.7)
HEMATOCRIT: 33.6 % — AB (ref 36.0–46.0)
Hemoglobin: 10.4 g/dL — ABNORMAL LOW (ref 12.0–15.0)
Lymphocytes Relative: 29 %
Lymphs Abs: 6 10*3/uL — ABNORMAL HIGH (ref 0.7–4.0)
MCH: 29.1 pg (ref 26.0–34.0)
MCHC: 31 g/dL (ref 30.0–36.0)
MCV: 94.1 fL (ref 78.0–100.0)
MONO ABS: 0.6 10*3/uL (ref 0.1–1.0)
Monocytes Relative: 3 %
NEUTROS ABS: 9.5 10*3/uL — AB (ref 1.7–7.7)
Neutrophils Relative %: 46 %
PLATELETS: 232 10*3/uL (ref 150–400)
RBC: 3.57 MIL/uL — ABNORMAL LOW (ref 3.87–5.11)
RDW: 16.1 % — AB (ref 11.5–15.5)
WBC: 20.8 10*3/uL — ABNORMAL HIGH (ref 4.0–10.5)

## 2017-01-01 LAB — I-STAT TROPONIN, ED: Troponin i, poc: 0.41 ng/mL (ref 0.00–0.08)

## 2017-01-01 LAB — COMPREHENSIVE METABOLIC PANEL
ALBUMIN: 3.9 g/dL (ref 3.5–5.0)
ALK PHOS: 82 U/L (ref 38–126)
ALT: 57 U/L — ABNORMAL HIGH (ref 14–54)
ANION GAP: 12 (ref 5–15)
AST: 44 U/L — ABNORMAL HIGH (ref 15–41)
BILIRUBIN TOTAL: 1.1 mg/dL (ref 0.3–1.2)
BUN: 23 mg/dL — ABNORMAL HIGH (ref 6–20)
CALCIUM: 9.7 mg/dL (ref 8.9–10.3)
CO2: 25 mmol/L (ref 22–32)
Chloride: 105 mmol/L (ref 101–111)
Creatinine, Ser: 1.37 mg/dL — ABNORMAL HIGH (ref 0.44–1.00)
GFR calc Af Amer: 40 mL/min — ABNORMAL LOW (ref >60)
GFR, EST NON AFRICAN AMERICAN: 35 mL/min — AB (ref >60)
GLUCOSE: 163 mg/dL — AB (ref 65–99)
Potassium: 3.5 mmol/L (ref 3.5–5.1)
Sodium: 142 mmol/L (ref 135–145)
TOTAL PROTEIN: 8.2 g/dL — AB (ref 6.5–8.1)

## 2017-01-01 LAB — INFLUENZA PANEL BY PCR (TYPE A & B)
Influenza A By PCR: NEGATIVE
Influenza B By PCR: NEGATIVE

## 2017-01-01 LAB — BRAIN NATRIURETIC PEPTIDE: B Natriuretic Peptide: 658.7 pg/mL — ABNORMAL HIGH (ref 0.0–100.0)

## 2017-01-01 LAB — PHOSPHORUS: Phosphorus: 4.5 mg/dL (ref 2.5–4.6)

## 2017-01-01 LAB — TROPONIN I
Troponin I: 3.26 ng/mL (ref <0.03)
Troponin I: 3.75 ng/mL (ref <0.03)

## 2017-01-01 LAB — MRSA PCR SCREENING: MRSA by PCR: NEGATIVE

## 2017-01-01 MED ORDER — HEPARIN (PORCINE) IN NACL 100-0.45 UNIT/ML-% IJ SOLN
1000.0000 [IU]/h | INTRAMUSCULAR | Status: DC
  Administered 2017-01-01 – 2017-01-02 (×2): 1000 [IU]/h via INTRAVENOUS
  Filled 2017-01-01 (×2): qty 250

## 2017-01-01 MED ORDER — MORPHINE SULFATE (PF) 4 MG/ML IV SOLN
2.0000 mg | Freq: Once | INTRAVENOUS | Status: AC
  Administered 2017-01-01: 2 mg via INTRAVENOUS
  Filled 2017-01-01: qty 1

## 2017-01-01 MED ORDER — IPRATROPIUM-ALBUTEROL 0.5-2.5 (3) MG/3ML IN SOLN
3.0000 mL | RESPIRATORY_TRACT | Status: DC | PRN
  Filled 2017-01-01: qty 3

## 2017-01-01 MED ORDER — IPRATROPIUM BROMIDE 0.02 % IN SOLN
RESPIRATORY_TRACT | Status: AC
  Administered 2017-01-01: 1 mg via RESPIRATORY_TRACT
  Filled 2017-01-01: qty 5

## 2017-01-01 MED ORDER — CEFTRIAXONE SODIUM 1 G IJ SOLR
1.0000 g | Freq: Once | INTRAMUSCULAR | Status: AC
  Administered 2017-01-01: 1 g via INTRAVENOUS
  Filled 2017-01-01: qty 10

## 2017-01-01 MED ORDER — FUROSEMIDE 10 MG/ML IJ SOLN
40.0000 mg | Freq: Two times a day (BID) | INTRAMUSCULAR | Status: AC
  Administered 2017-01-01 – 2017-01-02 (×2): 40 mg via INTRAVENOUS
  Filled 2017-01-01 (×2): qty 4

## 2017-01-01 MED ORDER — IPRATROPIUM BROMIDE 0.02 % IN SOLN
1.0000 mg | RESPIRATORY_TRACT | Status: AC
  Administered 2017-01-01: 1 mg via RESPIRATORY_TRACT

## 2017-01-01 MED ORDER — ALBUTEROL (5 MG/ML) CONTINUOUS INHALATION SOLN
INHALATION_SOLUTION | RESPIRATORY_TRACT | Status: AC
  Administered 2017-01-01: 20 mg/h via RESPIRATORY_TRACT
  Filled 2017-01-01: qty 20

## 2017-01-01 MED ORDER — SODIUM CHLORIDE 0.9% FLUSH
3.0000 mL | Freq: Two times a day (BID) | INTRAVENOUS | Status: DC
  Administered 2017-01-01 – 2017-01-02 (×3): 3 mL via INTRAVENOUS
  Administered 2017-01-03: 6 mL via INTRAVENOUS
  Administered 2017-01-03 – 2017-01-07 (×7): 3 mL via INTRAVENOUS

## 2017-01-01 MED ORDER — ALBUTEROL (5 MG/ML) CONTINUOUS INHALATION SOLN
20.0000 mg/h | INHALATION_SOLUTION | RESPIRATORY_TRACT | Status: DC
  Administered 2017-01-01 (×2): 20 mg/h via RESPIRATORY_TRACT

## 2017-01-01 MED ORDER — ONDANSETRON HCL 4 MG/2ML IJ SOLN: 4.0000 mg | Freq: Four times a day (QID) | INTRAMUSCULAR | Status: DC | PRN

## 2017-01-01 MED ORDER — ENOXAPARIN SODIUM 40 MG/0.4ML ~~LOC~~ SOLN: 40.0000 mg | SUBCUTANEOUS | Status: DC

## 2017-01-01 MED ORDER — INSULIN ASPART 100 UNIT/ML ~~LOC~~ SOLN
0.0000 [IU] | SUBCUTANEOUS | Status: DC
  Administered 2017-01-01: 4 [IU] via SUBCUTANEOUS
  Administered 2017-01-01: 7 [IU] via SUBCUTANEOUS
  Administered 2017-01-01 – 2017-01-02 (×2): 4 [IU] via SUBCUTANEOUS
  Administered 2017-01-02: 7 [IU] via SUBCUTANEOUS
  Administered 2017-01-02: 3 [IU] via SUBCUTANEOUS
  Administered 2017-01-02: 7 [IU] via SUBCUTANEOUS
  Administered 2017-01-03: 3 [IU] via SUBCUTANEOUS
  Administered 2017-01-03: 11 [IU] via SUBCUTANEOUS
  Administered 2017-01-03 (×2): 3 [IU] via SUBCUTANEOUS
  Administered 2017-01-03 (×2): 4 [IU] via SUBCUTANEOUS
  Administered 2017-01-04 (×2): 3 [IU] via SUBCUTANEOUS
  Administered 2017-01-04: 4 [IU] via SUBCUTANEOUS
  Administered 2017-01-04: 3 [IU] via SUBCUTANEOUS
  Administered 2017-01-04: 7 [IU] via SUBCUTANEOUS
  Administered 2017-01-05: 3 [IU] via SUBCUTANEOUS
  Administered 2017-01-05 – 2017-01-06 (×5): 4 [IU] via SUBCUTANEOUS

## 2017-01-01 MED ORDER — HYDRALAZINE HCL 20 MG/ML IJ SOLN: 10.0000 mg | Freq: Three times a day (TID) | INTRAMUSCULAR | Status: DC | PRN

## 2017-01-01 MED ORDER — ORAL CARE MOUTH RINSE
15.0000 mL | Freq: Two times a day (BID) | OROMUCOSAL | Status: DC
  Administered 2017-01-01 – 2017-01-08 (×11): 15 mL via OROMUCOSAL

## 2017-01-01 MED ORDER — ACETAMINOPHEN 325 MG PO TABS: 650.0000 mg | ORAL_TABLET | Freq: Four times a day (QID) | ORAL | Status: DC | PRN

## 2017-01-01 MED ORDER — METHYLPREDNISOLONE SODIUM SUCC 125 MG IJ SOLR
125.0000 mg | Freq: Two times a day (BID) | INTRAMUSCULAR | Status: DC
  Administered 2017-01-01: 125 mg via INTRAVENOUS
  Filled 2017-01-01: qty 2

## 2017-01-01 MED ORDER — ACETAMINOPHEN 650 MG RE SUPP
650.0000 mg | Freq: Four times a day (QID) | RECTAL | Status: DC | PRN
Start: 2017-01-01 — End: 2017-01-08

## 2017-01-01 MED ORDER — HEPARIN BOLUS VIA INFUSION
4000.0000 [IU] | Freq: Once | INTRAVENOUS | Status: AC
  Administered 2017-01-01: 4000 [IU] via INTRAVENOUS
  Filled 2017-01-01: qty 4000

## 2017-01-01 MED ORDER — ASPIRIN 300 MG RE SUPP
300.0000 mg | Freq: Every day | RECTAL | Status: DC
  Administered 2017-01-01: 300 mg via RECTAL
  Filled 2017-01-01: qty 1

## 2017-01-01 MED ORDER — KETOROLAC TROMETHAMINE 15 MG/ML IJ SOLN
15.0000 mg | Freq: Four times a day (QID) | INTRAMUSCULAR | Status: DC | PRN
  Filled 2017-01-01: qty 1

## 2017-01-01 MED ORDER — DEXTROSE 5 % IV SOLN
500.0000 mg | Freq: Once | INTRAVENOUS | Status: AC
  Administered 2017-01-01: 500 mg via INTRAVENOUS
  Filled 2017-01-01: qty 500

## 2017-01-01 MED ORDER — FUROSEMIDE 10 MG/ML IJ SOLN
40.0000 mg | Freq: Once | INTRAMUSCULAR | Status: AC
  Administered 2017-01-01: 40 mg via INTRAVENOUS
  Filled 2017-01-01: qty 4

## 2017-01-01 MED ORDER — ONDANSETRON HCL 4 MG PO TABS
4.0000 mg | ORAL_TABLET | Freq: Four times a day (QID) | ORAL | Status: DC | PRN
Start: 2017-01-01 — End: 2017-01-08

## 2017-01-01 MED ORDER — DEXTROSE 5 % IV SOLN
1.0000 g | INTRAVENOUS | Status: AC
  Administered 2017-01-02 – 2017-01-07 (×6): 1 g via INTRAVENOUS
  Filled 2017-01-01 (×6): qty 10

## 2017-01-01 MED ORDER — DEXTROSE 5 % IV SOLN
500.0000 mg | INTRAVENOUS | Status: DC
  Administered 2017-01-02 – 2017-01-07 (×6): 500 mg via INTRAVENOUS
  Filled 2017-01-01 (×7): qty 500

## 2017-01-01 MED ORDER — IPRATROPIUM-ALBUTEROL 0.5-2.5 (3) MG/3ML IN SOLN
3.0000 mL | RESPIRATORY_TRACT | Status: DC
  Administered 2017-01-01 – 2017-01-03 (×15): 3 mL via RESPIRATORY_TRACT
  Filled 2017-01-01 (×15): qty 3

## 2017-01-01 MED ORDER — BUDESONIDE 0.25 MG/2ML IN SUSP
0.2500 mg | Freq: Two times a day (BID) | RESPIRATORY_TRACT | Status: DC
  Administered 2017-01-01 – 2017-01-08 (×14): 0.25 mg via RESPIRATORY_TRACT
  Filled 2017-01-01 (×14): qty 2

## 2017-01-01 NOTE — Consult Note (Signed)
CARDIOLOGY CONSULT  HPI:  Kelly [name] is a 83 y.o. old female who presents from home with acute complaints of shortness of breath.  This had apparently being going on for 2-3 days prior to presenting to the ED.  On the day of presentation, she had difficulty standing and had a fall at home.  EMS was called and found her with labored breathing.  She was admitted to the hospitalist service for treatment of her acute respiratory distress.  Subsequently a troponin was drawn with the initial value being 0.41. The second level was more elevated at 3.26 and a third at 3.75.  EKG demonstrates a LBBB which she states she's had for some time.  She denies recent history of chest pain.  Assessment/Plan Elevated troponin   Assessment:  Given her lack of chest pain I suspect the troponin elevation is secondary to her ongoing respiratory distress.  She assures me she's been told she has a history of LBBB so this is not a new finding.  Can treat with IV heparin for 48 hrs and once she is more stable from a breathing perspective, can consider diagnostic cath.   Plan  -  Continue heparin drip  -  ECHO  -  Respiratory treatment as you are doing  -  Trend troponin until it starts going down  Past Cardiovascular History:  +CAD +MI +CHF - No documented h/o PVD - No documented h/o AAA - No documented h/o valvular heart disease - No documented h/o CVA - No documented h/o Arrhythmias - No documented h/o A-fib  - No documented h/o congenital heart disease - No documented h/o CABG - No documented h/o PCI - No documented h/o cardiac devices (Pacer/ICD/CRT) - No documented h/o cardiac surgery       Most recent stress test:  None  Most recent echocardiography:  Pending  Most recent left heart catheterization:  None  CABG:  Date/ Physician: None  Device history:  None  Intake/Output Summary (Last 24 hours) at 01/01/17 2311 Last data filed at 01/01/17 2000  Gross per 24 hour  Intake            338.83 ml  Output                0 ml  Net           338.83 ml    MEDS:  albuterol Last Rate: 20 mg/hr (01/01/17 1142)  heparin Last Rate: 1,000 Units/hr (01/01/17 2000)    aspirin 300 mg Daily  [START ON 01/02/2017] azithromycin 500 mg Q24H  budesonide (PULMICORT) nebulizer solution 0.25 mg BID  [START ON 01/02/2017] cefTRIAXone (ROCEPHIN)  IV 1 g Q24H  furosemide 40 mg Q12H  insulin aspart 0-20 Units Q4H  ipratropium-albuterol 3 mL Q4H  mouth rinse 15 mL BID  methylPREDNISolone (SOLU-MEDROL) injection 125 mg Q12H  sodium chloride flush 3 mL Q12H    Review of Systems:  GEN: no fever, chills, nausea, vomiting, weight change  HEENT: no vision or hearing changes  PULM: +SOB  CV: no chest pain, palpitations, PND, orthopnea  GI: no abdominal pain  GU: no dysuria  EXT: no swelling  SKIN: no rashes  NEURO: no numbness or tingling  HEME: no bleeding or bruising  GYN: none  --12 point review systems- otherwise negative.  Physical Examination: Blood pressure 121/68, pulse 84, temperature 97.5 F (36.4 C), temperature source Oral, resp. rate (!) 21, height 5\' 7"  (1.702 m), weight 84.2 kg (185 lb 11.2 oz), SpO2  100 %. General:  AAOX 4.  NAD.  NRD.  On a non-rebreather HENT: Normocephalic. Atraumatic.  No acute abnom. EYES: PERRL EOMI  Neck: Supple.  No JVD.  No bruits. Cardiovascular:  Nl S1. Nl S2. No S3. No S4. Nl PMI. 2/6 systolic murmur. RRR  Pulmonary/Chest: CTA B. No rales. No wheezing.  Abdomen: Soft, NT, no masses, no organomegaly. Neuro: CN intact, no motor/sensory deficit.  Ext: Warm. No edema.  SKIN- intact  Recent Labs     01/01/17  1046  HGB  10.4*  HCT  33.6*  WBC  20.8*  BUN  23*  CREATININE  1.37*  GLUCOSE  163*  CALCIUM  9.7  BNP  658.7*    Discuss the benefits and adverse side affects of the medications use.  Discuss the benefits and adverse side affects of the required study.  Discuss the risk and benefits of ambulation during hospitalization.    Link Snuffer, MD, PhD Cardiology

## 2017-01-01 NOTE — ED Triage Notes (Signed)
Pt in from home c/o SOB upon arrival via EMS, per EMS pts family called d/t increased SOB, pt found by fire to be tachypneic, pt O2 sats initial 88%, per EMS pt rcvd 15 mg Albuterol, 2 grams, 125 mg Solumedrol, pta, pt responsible to pain upon arrival to ED, pt hx of COPD

## 2017-01-01 NOTE — Progress Notes (Signed)
MD Melynda Ripple notified of critical lactic acid of 4.4 and troponin of 3.26. MD states that he will contact Cardiology again concerning pt. Also MD made aware of negative influenza PCR however will leave droplet precautions since respiratory panel is pending. Family in room at bedside and undated of pt's condition. Will continue to closely monitor patient at this time.

## 2017-01-01 NOTE — H&P (Addendum)
[facility] History and Physical  Kelly [name] ZOX:[id number] DOB: 04/03/1931 DOA: 01/01/2017  Referring physician:  PCP: No primary care provider on file.   Chief Complaint: "Her breathing was so labored."-Son  HPI: Kelly [name]  with past medical history of CKD, dm, COPD, left bundle branch block and congestive heart failure presents to the emergency room with chief complaint of shortness of breath. Patient's son reports she looked more short of breath a few days prior to coming to the emergency room. Patient this morning had difficulty standing and fell. Patient had labored breathing by report. Patient requested EMS be called. EMS came gave patient steroids, magnesium, CPAP and a breathing treatment.  Limited history from patient due to respiratory fatigue. History gathered mostly from patient's son and records.  ED course: Patient placed on BiPAP after dropped off by EMS. Hospitalist consult for admission. Intensivist consult requested. Since this advised of continued improvement patient could go to hospital service. Patient continued to improve. Hospitalist reconsult for admission. Patient given 1 DuoNeb along with Rocephin and azithromycin in the emergency room. Also one albuterol tx.  Review of Systems:  Limited ROS due to acuity of condition   Past Medical History:  Diagnosis Date  . CHF (congestive heart failure) (HCC)   . CKD (chronic kidney disease)   . COPD (chronic obstructive pulmonary disease) (HCC)   . LBBB (left bundle branch block)    History reviewed. No pertinent surgical history. Social History:  reports that she has quit smoking. She does not have any smokeless tobacco history on file. She reports that she does not drink alcohol. Her drug history is not on file.  Not on File  Family History  Problem Relation Age of Onset  . COPD Neg Hx      Prior to Admission medications   Medication Sig Start Date End Date Taking?  Authorizing Provider  aspirin 325 MG tablet Take 325 mg by mouth daily.   Yes Historical Provider, MD  Cholecalciferol (VITAMIN D3) 1000 units CAPS Take 1,000 Units by mouth daily.   Yes Historical Provider, MD  ferrous sulfate 325 (65 FE) MG EC tablet Take 325 mg by mouth 3 (three) times daily with meals.   Yes Historical Provider, MD  Fluticasone-Salmeterol (ADVAIR) 500-50 MCG/DOSE AEPB Inhale 1 puff into the lungs 2 (two) times daily.   Yes Historical Provider, MD  furosemide (LASIX) 20 MG tablet Take 20 mg by mouth every Monday, Wednesday, and Friday.   Yes Historical Provider, MD  losartan-hydrochlorothiazide (HYZAAR) 100-12.5 MG tablet Take 0.5 tablets by mouth daily.   Yes Historical Provider, MD  pravastatin (PRAVACHOL) 40 MG tablet Take 40 mg by mouth daily.   Yes Historical Provider, MD  sitaGLIPtin (JANUVIA) 25 MG tablet Take 25 mg by mouth daily.   Yes Historical Provider, MD  triamterene-hydrochlorothiazide (DYAZIDE) 37.5-25 MG capsule Take 1 capsule by mouth daily.   Yes Historical Provider, MD  umeclidinium bromide (INCRUSE ELLIPTA) 62.5 MCG/INH AEPB Inhale 1 puff into the lungs daily.   Yes Historical Provider, MD  vitamin B-12 (CYANOCOBALAMIN) 1000 MCG tablet Take 1,000 mcg by mouth daily.   Yes Historical Provider, MD   Physical Exam: Vitals:   01/01/17 1315 01/01/17 1330 01/01/17 1345 01/01/17 1400  BP: 123/59 (!) 117/53 133/55 127/58  Pulse: 95 92 92 87  Resp: 26 23 25 18   Temp:      TempSrc:      SpO2: 96% 96% 99% 100%    Wt  Readings from Last 3 Encounters:  No data found for Wt    General:  Appears calm and comfortable, Ill, tired, A&Ox3 Eyes:  PERRL, EOMI, normal lids, iris ENT:  grossly normal hearing, lips & tongue Neck:  no LAD, masses or thyromegaly Cardiovascular:  RRR, no m/r/g. No LE edema.  Respiratory:  On BiPAP, use of accessory muscles, inspiratory expiratory wheezing Abdomen:  soft, distended, nontender Skin:  no rash or induration seen on limited  exam Musculoskeletal:  grossly normal tone BUE/BLE Psychiatric:  grossly normal mood and affect, speech fluent and appropriate Neurologic:  CN 2-12 grossly intact, moves all extremities in coordinated fashion.          Labs on Admission:  Basic Metabolic Panel:  Recent Labs Lab 01/01/17 1046  NA 142  K 3.5  CL 105  CO2 25  GLUCOSE 163*  BUN 23*  CREATININE 1.37*  CALCIUM 9.7   Liver Function Tests:  Recent Labs Lab 01/01/17 1046  AST 44*  ALT 57*  ALKPHOS 82  BILITOT 1.1  PROT 8.2*  ALBUMIN 3.9   No results for input(s): LIPASE, AMYLASE in the last 168 hours. No results for input(s): AMMONIA in the last 168 hours. CBC:  Recent Labs Lab 01/01/17 1046  WBC 20.8*  NEUTROABS 9.5*  HGB 10.4*  HCT 33.6*  MCV 94.1  PLT 232   Cardiac Enzymes: No results for input(s): CKTOTAL, CKMB, CKMBINDEX, TROPONINI in the last 168 hours.  BNP (last 3 results)  Recent Labs  01/01/17 1046  BNP 658.7*    ProBNP (last 3 results) No results for input(s): PROBNP in the last 8760 hours.   Creatinine clearance cannot be calculated (Unknown ideal weight.)  CBG: No results for input(s): GLUCAP in the last 168 hours.  Radiological Exams on Admission: Dg Chest Port 1 View  Result Date: 01/01/2017 CLINICAL DATA:  Respiratory distress. EXAM: PORTABLE CHEST 1 VIEW COMPARISON:  None. FINDINGS: Cardiomegaly. Bilateral pulmonary opacities, right greater than left. No pneumothorax. No other acute abnormalities. IMPRESSION: Diffuse bilateral pulmonary opacities, right greater than left. Opacity is more confluent and patchy in the right base. The findings could represent asymmetric edema but diffuse infection is not completely excluded. Recommend clinical correlation and follow-up. Electronically Signed   By: Gerome Sam III M.D   On: 01/01/2017 10:54    EKG: Independently reviewed. Tachycardia, left bundle branch block, ST depressions in V lead; no EKG for  comparison  Assessment/Plan Principal Problem:   Acute respiratory failure (HCC) Active Problems:   COPD with acute exacerbation (HCC)   LBBB (left bundle branch block)   CKD (chronic kidney disease)  Acute respiratory failure with hypercapnia and hypoxia  Likely multifactorial with elements of heart failure, COPD exacerbation and possibly viral or bacterial process Scheduled DuoNeb's When necessary albuterol Antibiotic- azithromycin, rocephin Oxygen therapy Continuous pulse oximetry Resp steroids Cont lasix Cont IV steroids Inhaled pulmicort RT consult CXR in AM  Elevated white count Check UA  Abdominal distention Check abdominal film  CHF, LBBB, mild elevated trop ECHO tomorrow, needs order Cycle trop Rectal asa Discussed with cardiology (Dr. Anne Fu) and on-call physician agrees that and not likely to be l stemi presentation but all changes likely due to hypoxia. We reviewed the EKG together over the phone.  DM SSI A1c  COPD Hold Advair  Hypertension When necessary hydralazine 10 mg IV as needed for severe blood pressure Hold  hyzaar & hyazude  CKD Unknown baseline Will monitor  Hyperlipidemia Hold statin  Code Status:  FULL  DVT Prophylaxis:  Lovenox Family Communication: son at bedside Disposition Plan: Pending Improvement  Status: inpt, sdu  Haydee Salter, MD Family Medicine [facility] [email or web address] Password [email or web address]   Addendum:  Elevated trop. Repaged cardiology, consulted. Starting on heparin. Getting EKG.  Haydee Salter MD

## 2017-01-01 NOTE — ED Notes (Signed)
Pt tolerating NRB

## 2017-01-01 NOTE — ED Provider Notes (Signed)
[facility]-EMERGENCY DEPT Provider Note   CSN: [id number] Arrival date & time: 01/01/17  1029     History   Chief Complaint No chief complaint on file.   HPI Kelly [name] is a 83 y.o. female.  Patient is an elderly female who presents by EMS for evaluation of difficulty breathing. She has a history of COPD and was found by EMS to be in significant respiratory distress. Her initial oxygen saturations were in the 60s. She was given nebulizer treatments, Solu-Medrol, magnesium, and placed on CPAP. She was then transported here for evaluation. She is unable to add additional history secondary to the severity of her condition.      No past medical history on file.  There are no active problems to display for this patient.   No past surgical history on file.  OB History    No data available       Home Medications    Prior to Admission medications   Not on File    Family History No family history on file.  Social History Social History  Substance Use Topics  . Smoking status: Not on file  . Smokeless tobacco: Not on file  . Alcohol use Not on file     Allergies   Patient has no allergy information on record.   Review of Systems Review of Systems  Unable to perform ROS: Acuity of condition     Physical Exam Updated Vital Signs There were no vitals taken for this visit.  Physical Exam  Constitutional: She is oriented to person, place, and time. She appears well-developed and well-nourished. No distress.  HENT:  Head: Normocephalic and atraumatic.  Neck: Normal range of motion. Neck supple.  Cardiovascular: Normal rate and regular rhythm.  Exam reveals no gallop and no friction rub.   No murmur heard. Pulmonary/Chest: She is in respiratory distress.  Patient is in moderate to severe respiratory distress. She is being assisted with CPAP, however is not moving much air.  Abdominal: Soft. Bowel sounds are normal. She exhibits no distension. There is no  tenderness.  Musculoskeletal: Normal range of motion. She exhibits edema.  There is 1+ pitting edema both lower extremities.  Neurological: She is alert and oriented to person, place, and time.  Skin: Skin is warm and dry. She is not diaphoretic.  Nursing note and vitals reviewed.    ED Treatments / Results  Labs (all labs ordered are listed, but only abnormal results are displayed) Labs Reviewed  BRAIN NATRIURETIC PEPTIDE  COMPREHENSIVE METABOLIC PANEL  CBC WITH DIFFERENTIAL/PLATELET  BLOOD GAS, ARTERIAL  I-STAT TROPOININ, ED    EKG  EKG Interpretation None       Radiology No results found.  Procedures Procedures (including critical care time)  Medications Ordered in ED Medications  ipratropium (ATROVENT) 0.02 % nebulizer solution (not administered)  albuterol (PROVENTIL, VENTOLIN) (5 MG/ML) 0.5% continuous inhalation solution (not administered)     Initial Impression / Assessment and Plan / ED Course  I have reviewed the triage vital signs and the nursing notes.  Pertinent labs & imaging results that were available during my care of the patient were reviewed by me and considered in my medical decision making (see chart for details).     Patient is an 83 year old female with history of COPD. She was brought by EMS for evaluation of respiratory distress. Little is known about her past medical history as she recently relocated here from [place] and has no primary doctor.  She was found  by EMS to be in respiratory distress requiring CPAP. She was somnolent upon arrival and transferred to BiPAP. Initial blood gas revealed a respiratory acidosis with pH of 7.07 and PCO2 of 96. She was on BiPAP for approximately 40 minutes, then the blood gas was repeated. This did show interval improvement and she appeared to be waking up somewhat.  She was also found to have a white count of 21,000, mild bump in her troponin, elevated BNP. She was given Lasix, Rocephin and Zithromax in  addition to the magnesium, Solu-Medrol, and nebulizer treatments she had received in route.  At this point, I spoke with the hospitalist who recommends consultation with intensive care. I spoke with Dr. Marchelle Gearing who is recommending an additional blood gas in one hour to see which way she is trending. This was obtained and revealed again interval improvement. I then discussed with the hospitalist, Dr. Melynda Ripple who agrees to admit.  CRITICAL CARE Performed by: Geoffery Lyons Total critical care time: 70 minutes Critical care time was exclusive of separately billable procedures and treating other patients. Critical care was necessary to treat or prevent imminent or life-threatening deterioration. Critical care was time spent personally by me on the following activities: development of treatment plan with patient and/or surrogate as well as nursing, discussions with consultants, evaluation of patient's response to treatment, examination of patient, obtaining history from patient or surrogate, ordering and performing treatments and interventions, ordering and review of laboratory studies, ordering and review of radiographic studies, pulse oximetry and re-evaluation of patient's condition.   Final Clinical Impressions(s) / ED Diagnoses   Final diagnoses:  None    New Prescriptions New Prescriptions   No medications on file     Geoffery Lyons, MD 01/01/17 1425

## 2017-01-01 NOTE — Progress Notes (Signed)
ANTICOAGULATION CONSULT NOTE   Pharmacy Consult for heparin  Indication: chest pain/ACS  No Known Allergies  Patient Measurements: Height: 5\' 7"  (170.2 cm) Weight: 185 lb 11.2 oz (84.2 kg) IBW/kg (Calculated) : 61.6 Heparin Dosing Weight: 80  Vital Signs: Temp: 97.5 F (36.4 C) (01/20 1706) Temp Source: Oral (01/20 1706) BP: 141/66 (01/20 1731) Pulse Rate: 87 (01/20 1731)  Labs:  Recent Labs  01/01/17 1046 01/01/17 1609  HGB 10.4*  --   HCT 33.6*  --   PLT 232  --   CREATININE 1.37*  --   TROPONINI  --  3.26*    Estimated Creatinine Clearance: 34.7 mL/min (by C-G formula based on SCr of 1.37 mg/dL (H)).   Medical History: Past Medical History:  Diagnosis Date  . CHF (congestive heart failure) (HCC)   . CKD (chronic kidney disease)   . COPD (chronic obstructive pulmonary disease) (HCC)   . LBBB (left bundle branch block)     Assessment: 82 yoF admitted with SOB and initial troponin of 3.26. No known anticoagulation PTA and CBC stable.   Goal of Therapy:  Heparin level 0.3-0.7 units/ml Monitor platelets by anticoagulation protocol: Yes   Plan:  1. Give 4000 units bolus x 1 2. Start heparin infusion at 1000 units/hr 3. Check anti-Xa level in 8 hours and daily while on heparin 4. Continue to monitor H&H and platelets  Pollyann Samples, PharmD, BCPS 01/01/2017, 6:25 PM Pager: [phone]

## 2017-01-01 NOTE — ED Notes (Signed)
X-ray at bedside

## 2017-01-02 ENCOUNTER — Observation Stay (HOSPITAL_COMMUNITY): Payer: Medicare Other

## 2017-01-02 DIAGNOSIS — D519 Vitamin B12 deficiency anemia, unspecified: Secondary | ICD-10-CM | POA: Diagnosis present

## 2017-01-02 DIAGNOSIS — J441 Chronic obstructive pulmonary disease with (acute) exacerbation: Secondary | ICD-10-CM | POA: Diagnosis not present

## 2017-01-02 DIAGNOSIS — R488 Other symbolic dysfunctions: Secondary | ICD-10-CM | POA: Diagnosis not present

## 2017-01-02 DIAGNOSIS — I425 Other restrictive cardiomyopathy: Secondary | ICD-10-CM | POA: Diagnosis present

## 2017-01-02 DIAGNOSIS — I255 Ischemic cardiomyopathy: Secondary | ICD-10-CM | POA: Diagnosis not present

## 2017-01-02 DIAGNOSIS — J918 Pleural effusion in other conditions classified elsewhere: Secondary | ICD-10-CM | POA: Diagnosis not present

## 2017-01-02 DIAGNOSIS — D509 Iron deficiency anemia, unspecified: Secondary | ICD-10-CM | POA: Diagnosis not present

## 2017-01-02 DIAGNOSIS — N183 Chronic kidney disease, stage 3 (moderate): Secondary | ICD-10-CM | POA: Diagnosis not present

## 2017-01-02 DIAGNOSIS — N179 Acute kidney failure, unspecified: Secondary | ICD-10-CM | POA: Diagnosis not present

## 2017-01-02 DIAGNOSIS — R931 Abnormal findings on diagnostic imaging of heart and coronary circulation: Secondary | ICD-10-CM | POA: Diagnosis not present

## 2017-01-02 DIAGNOSIS — I429 Cardiomyopathy, unspecified: Secondary | ICD-10-CM | POA: Diagnosis not present

## 2017-01-02 DIAGNOSIS — R1310 Dysphagia, unspecified: Secondary | ICD-10-CM | POA: Diagnosis not present

## 2017-01-02 DIAGNOSIS — I5041 Acute combined systolic (congestive) and diastolic (congestive) heart failure: Secondary | ICD-10-CM | POA: Diagnosis not present

## 2017-01-02 DIAGNOSIS — I471 Supraventricular tachycardia: Secondary | ICD-10-CM | POA: Diagnosis not present

## 2017-01-02 DIAGNOSIS — I447 Left bundle-branch block, unspecified: Secondary | ICD-10-CM | POA: Diagnosis not present

## 2017-01-02 DIAGNOSIS — I110 Hypertensive heart disease with heart failure: Secondary | ICD-10-CM | POA: Diagnosis not present

## 2017-01-02 DIAGNOSIS — N178 Other acute kidney failure: Secondary | ICD-10-CM | POA: Diagnosis not present

## 2017-01-02 DIAGNOSIS — N189 Chronic kidney disease, unspecified: Secondary | ICD-10-CM

## 2017-01-02 DIAGNOSIS — J9600 Acute respiratory failure, unspecified whether with hypoxia or hypercapnia: Secondary | ICD-10-CM | POA: Diagnosis not present

## 2017-01-02 DIAGNOSIS — R531 Weakness: Secondary | ICD-10-CM | POA: Diagnosis not present

## 2017-01-02 DIAGNOSIS — I214 Non-ST elevation (NSTEMI) myocardial infarction: Secondary | ICD-10-CM | POA: Diagnosis present

## 2017-01-02 DIAGNOSIS — J189 Pneumonia, unspecified organism: Secondary | ICD-10-CM | POA: Diagnosis present

## 2017-01-02 DIAGNOSIS — I130 Hypertensive heart and chronic kidney disease with heart failure and stage 1 through stage 4 chronic kidney disease, or unspecified chronic kidney disease: Secondary | ICD-10-CM | POA: Diagnosis present

## 2017-01-02 DIAGNOSIS — R0602 Shortness of breath: Secondary | ICD-10-CM | POA: Diagnosis not present

## 2017-01-02 DIAGNOSIS — M6281 Muscle weakness (generalized): Secondary | ICD-10-CM | POA: Diagnosis not present

## 2017-01-02 DIAGNOSIS — J9602 Acute respiratory failure with hypercapnia: Secondary | ICD-10-CM | POA: Diagnosis not present

## 2017-01-02 DIAGNOSIS — I2729 Other secondary pulmonary hypertension: Secondary | ICD-10-CM | POA: Diagnosis present

## 2017-01-02 DIAGNOSIS — I420 Dilated cardiomyopathy: Secondary | ICD-10-CM | POA: Diagnosis not present

## 2017-01-02 DIAGNOSIS — I272 Pulmonary hypertension, unspecified: Secondary | ICD-10-CM | POA: Diagnosis present

## 2017-01-02 DIAGNOSIS — I081 Rheumatic disorders of both mitral and tricuspid valves: Secondary | ICD-10-CM | POA: Diagnosis present

## 2017-01-02 DIAGNOSIS — E872 Acidosis: Secondary | ICD-10-CM | POA: Diagnosis present

## 2017-01-02 DIAGNOSIS — E876 Hypokalemia: Secondary | ICD-10-CM | POA: Diagnosis present

## 2017-01-02 DIAGNOSIS — R042 Hemoptysis: Secondary | ICD-10-CM | POA: Diagnosis not present

## 2017-01-02 DIAGNOSIS — I1310 Hypertensive heart and chronic kidney disease without heart failure, with stage 1 through stage 4 chronic kidney disease, or unspecified chronic kidney disease: Secondary | ICD-10-CM | POA: Diagnosis not present

## 2017-01-02 DIAGNOSIS — E1122 Type 2 diabetes mellitus with diabetic chronic kidney disease: Secondary | ICD-10-CM | POA: Diagnosis present

## 2017-01-02 DIAGNOSIS — J9601 Acute respiratory failure with hypoxia: Secondary | ICD-10-CM | POA: Diagnosis present

## 2017-01-02 DIAGNOSIS — E785 Hyperlipidemia, unspecified: Secondary | ICD-10-CM | POA: Diagnosis present

## 2017-01-02 LAB — URINALYSIS, ROUTINE W REFLEX MICROSCOPIC
BACTERIA UA: NONE SEEN
Bilirubin Urine: NEGATIVE
Glucose, UA: NEGATIVE mg/dL
Ketones, ur: NEGATIVE mg/dL
Leukocytes, UA: NEGATIVE
Nitrite: NEGATIVE
PH: 5 (ref 5.0–8.0)
Protein, ur: NEGATIVE mg/dL
SPECIFIC GRAVITY, URINE: 1.008 (ref 1.005–1.030)

## 2017-01-02 LAB — CBC
HCT: 25.6 % — ABNORMAL LOW (ref 36.0–46.0)
HEMOGLOBIN: 8.4 g/dL — AB (ref 12.0–15.0)
MCH: 29.5 pg (ref 26.0–34.0)
MCHC: 32.8 g/dL (ref 30.0–36.0)
MCV: 89.8 fL (ref 78.0–100.0)
Platelets: 151 10*3/uL (ref 150–400)
RBC: 2.85 MIL/uL — AB (ref 3.87–5.11)
RDW: 15.9 % — ABNORMAL HIGH (ref 11.5–15.5)
WBC: 14.1 10*3/uL — ABNORMAL HIGH (ref 4.0–10.5)

## 2017-01-02 LAB — HEPARIN LEVEL (UNFRACTIONATED)
HEPARIN UNFRACTIONATED: 0.63 [IU]/mL (ref 0.30–0.70)
Heparin Unfractionated: 0.57 [IU]/mL (ref 0.30–0.70)

## 2017-01-02 LAB — TROPONIN I
TROPONIN I: 4.91 ng/mL — AB (ref <0.03)
Troponin I: 3.91 ng/mL (ref <0.03)

## 2017-01-02 LAB — GLUCOSE, CAPILLARY
GLUCOSE-CAPILLARY: 205 mg/dL — AB (ref 65–99)
GLUCOSE-CAPILLARY: 118 mg/dL — AB (ref 65–99)
Glucose-Capillary: 142 mg/dL — ABNORMAL HIGH (ref 65–99)
Glucose-Capillary: 154 mg/dL — ABNORMAL HIGH (ref 65–99)
Glucose-Capillary: 202 mg/dL — ABNORMAL HIGH (ref 65–99)

## 2017-01-02 LAB — BASIC METABOLIC PANEL
ANION GAP: 11 (ref 5–15)
BUN: 30 mg/dL — ABNORMAL HIGH (ref 6–20)
CHLORIDE: 104 mmol/L (ref 101–111)
CO2: 27 mmol/L (ref 22–32)
CREATININE: 1.39 mg/dL — AB (ref 0.44–1.00)
Calcium: 8.9 mg/dL (ref 8.9–10.3)
GFR calc non Af Amer: 34 mL/min — ABNORMAL LOW (ref >60)
GFR, EST AFRICAN AMERICAN: 39 mL/min — AB (ref >60)
Glucose, Bld: 152 mg/dL — ABNORMAL HIGH (ref 65–99)
Potassium: 2.9 mmol/L — ABNORMAL LOW (ref 3.5–5.1)
Sodium: 142 mmol/L (ref 135–145)

## 2017-01-02 LAB — HEMOGLOBIN A1C
HEMOGLOBIN A1C: 5.5 % (ref 4.8–5.6)
MEAN PLASMA GLUCOSE: 111 mg/dL

## 2017-01-02 LAB — MAGNESIUM: Magnesium: 2.1 mg/dL (ref 1.7–2.4)

## 2017-01-02 MED ORDER — METHYLPREDNISOLONE SODIUM SUCC 125 MG IJ SOLR
125.0000 mg | Freq: Four times a day (QID) | INTRAMUSCULAR | Status: DC
  Administered 2017-01-02 – 2017-01-03 (×6): 125 mg via INTRAVENOUS
  Filled 2017-01-02 (×6): qty 2

## 2017-01-02 MED ORDER — PRAVASTATIN SODIUM 40 MG PO TABS
40.0000 mg | ORAL_TABLET | Freq: Every day | ORAL | Status: DC
  Administered 2017-01-02 – 2017-01-07 (×6): 40 mg via ORAL
  Filled 2017-01-02 (×6): qty 1

## 2017-01-02 MED ORDER — ASPIRIN EC 81 MG PO TBEC: 81.0000 mg | DELAYED_RELEASE_TABLET | Freq: Every day | ORAL | Status: DC

## 2017-01-02 MED ORDER — POTASSIUM CHLORIDE 20 MEQ/15ML (10%) PO SOLN
40.0000 meq | Freq: Three times a day (TID) | ORAL | Status: AC
  Administered 2017-01-02 (×3): 40 meq via ORAL
  Filled 2017-01-02 (×3): qty 30

## 2017-01-02 MED ORDER — MAGNESIUM SULFATE 2 GM/50ML IV SOLN
2.0000 g | Freq: Once | INTRAVENOUS | Status: AC
  Administered 2017-01-02: 2 g via INTRAVENOUS
  Filled 2017-01-02: qty 50

## 2017-01-02 MED ORDER — ALPRAZOLAM 0.5 MG PO TABS
0.5000 mg | ORAL_TABLET | Freq: Every evening | ORAL | Status: AC | PRN
  Administered 2017-01-02 – 2017-01-03 (×2): 0.5 mg via ORAL
  Filled 2017-01-02 (×2): qty 1

## 2017-01-02 MED ORDER — ASPIRIN EC 325 MG PO TBEC
325.0000 mg | DELAYED_RELEASE_TABLET | Freq: Every day | ORAL | Status: DC
  Administered 2017-01-02: 325 mg via ORAL
  Filled 2017-01-02: qty 1

## 2017-01-02 NOTE — Progress Notes (Signed)
ANTICOAGULATION CONSULT NOTE   Pharmacy Consult for heparin  Indication: chest pain/ACS  No Known Allergies  Patient Measurements: Height: 5\' 7"  (170.2 cm) Weight: 185 lb 11.2 oz (84.2 kg) IBW/kg (Calculated) : 61.6 Heparin Dosing Weight: 80  Vital Signs: Temp: 98.1 F (36.7 C) (01/21 0000) Temp Source: Oral (01/21 0000) BP: 121/68 (01/20 1850) Pulse Rate: 72 (01/21 0400)  Labs:  Recent Labs  01/01/17 1046 01/01/17 1609 01/01/17 2029 01/02/17 0226  HGB 10.4*  --   --  8.4*  HCT 33.6*  --   --  25.6*  PLT 232  --   --  151  HEPARINUNFRC  --   --   --  0.57  CREATININE 1.37*  --   --  1.39*  TROPONINI  --  3.26* 3.75* 3.91*    Estimated Creatinine Clearance: 34.2 mL/min (by C-G formula based on SCr of 1.39 mg/dL (H)).  Assessment: 83 yo Female with elevated cardiac markers for heparin   Goal of Therapy:  Heparin level 0.3-0.7 units/ml Monitor platelets by anticoagulation protocol: Yes   Plan:  Continue Heparin at current rate   Geannie Risen, PharmD, BCPS

## 2017-01-02 NOTE — Progress Notes (Signed)
ANTICOAGULATION CONSULT NOTE   Pharmacy Consult for heparin  Indication: chest pain/ACS  No Known Allergies  Patient Measurements: Height: 5\' 7"  (170.2 cm) Weight: 185 lb 11.2 oz (84.2 kg) IBW/kg (Calculated) : 61.6 Heparin Dosing Weight: 80  Vital Signs: Temp: 97.9 F (36.6 C) (01/21 1205) Temp Source: Oral (01/21 1205) BP: 124/64 (01/21 1216) Pulse Rate: 79 (01/21 1216)  Labs:  Recent Labs  01/01/17 1046  01/01/17 2029 01/02/17 0226 01/02/17 1132  HGB 10.4*  --   --  8.4*  --   HCT 33.6*  --   --  25.6*  --   PLT 232  --   --  151  --   HEPARINUNFRC  --   --   --  0.57 0.63  CREATININE 1.37*  --   --  1.39*  --   TROPONINI  --   < > 3.75* 3.91* 4.91*  < > = values in this interval not displayed.  Estimated Creatinine Clearance: 34.2 mL/min (by C-G formula based on SCr of 1.39 mg/dL (H)).  Assessment: 83 yo Female with elevated cardiac markers for heparin.   Repeat heparin level continues to be at goal (0.63). Did see a drop in hgb with am labs but no bleeding issues have been noted.   Goal of Therapy:  Heparin level 0.3-0.7 units/ml Monitor platelets by anticoagulation protocol: Yes   Plan:  Continue Heparin at current rate  Daily Heparin level and cbc Follow up plan for heparin in am  Sheppard Coil PharmD., BCPS Clinical Pharmacist Pager [phone] 01/02/2017 1:56 PM

## 2017-01-02 NOTE — Progress Notes (Signed)
Patient placed on nasal CPAP for HS per patient request and as per patient home regimen.  8 cmH20 and 5L oxygen bleed in used.  Initially, patient tolerated well.  Within a few minutes patient became anxious and SOB.  Patient returned to nasal cannula, sats 97%.  RR 22.

## 2017-01-02 NOTE — Progress Notes (Signed)
Progress Note  Patient Name: Daveena Kreller Date of Encounter: 01/02/2017  Primary Cardiologist: New (to establish with [facility] based practice)  Subjective   No active chest pain. Eating lunch. Has had some dysphagia. No cough.  Inpatient Medications    Scheduled Meds: . aspirin EC  325 mg Oral Daily  . azithromycin  500 mg Intravenous Q24H  . budesonide (PULMICORT) nebulizer solution  0.25 mg Nebulization BID  . cefTRIAXone (ROCEPHIN)  IV  1 g Intravenous Q24H  . insulin aspart  0-20 Units Subcutaneous Q4H  . ipratropium-albuterol  3 mL Nebulization Q4H  . mouth rinse  15 mL Mouth Rinse BID  . methylPREDNISolone (SOLU-MEDROL) injection  125 mg Intravenous Q6H  . potassium chloride  40 mEq Oral TID  . sodium chloride flush  3 mL Intravenous Q12H   Continuous Infusions: . albuterol 20 mg/hr (01/01/17 1142)  . heparin 1,000 Units/hr (01/02/17 1304)   PRN Meds: acetaminophen **OR** acetaminophen, ALPRAZolam, hydrALAZINE, ipratropium-albuterol, ketorolac, ondansetron **OR** ondansetron (ZOFRAN) IV   Vital Signs    Vitals:   01/02/17 0723 01/02/17 0923 01/02/17 1205 01/02/17 1216  BP: 122/61  124/64 124/64  Pulse: 77 76 80 79  Resp: (!) 96 (!) 28 (!) 27 (!) 24  Temp: 97 F (36.1 C)  97.9 F (36.6 C)   TempSrc: Oral  Oral   SpO2: 98% 98% 97% 96%  Weight:      Height:        Intake/Output Summary (Last 24 hours) at 01/02/17 1337 Last data filed at 01/02/17 1130  Gross per 24 hour  Intake           958.83 ml  Output              400 ml  Net           558.83 ml   Filed Weights   01/01/17 1706 01/02/17 0400  Weight: 185 lb 11.2 oz (84.2 kg) 185 lb 11.2 oz (84.2 kg)    Telemetry    Procedure review telemetry which shows sinus rhythm.  ECG    I personally reviewed the tracing from 01/01/2017 which shows sinus rhythm with left branch block.  Physical Exam   GEN: No acute distress.  Neck: No JVD Cardiac: RRR, no murmurs, rubs, or gallops.    Respiratory: Decreased breath sounds, no wheezing.Marland Kitchen GI: Soft, nontender, non-distended  MS: No edema; No deformity. Neuro:  AAOx3. Psych: Normal affect  Labs    Chemistry  Recent Labs Lab 01/01/17 1046 01/02/17 0226  NA 142 142  K 3.5 2.9*  CL 105 104  CO2 25 27  GLUCOSE 163* 152*  BUN 23* 30*  CREATININE 1.37* 1.39*  CALCIUM 9.7 8.9  PROT 8.2*  --   ALBUMIN 3.9  --   AST 44*  --   ALT 57*  --   ALKPHOS 82  --   BILITOT 1.1  --   GFRNONAA 35* 34*  GFRAA 40* 39*  ANIONGAP 12 11     Hematology  Recent Labs Lab 01/01/17 1046 01/02/17 0226  WBC 20.8* 14.1*  RBC 3.57* 2.85*  HGB 10.4* 8.4*  HCT 33.6* 25.6*  MCV 94.1 89.8  MCH 29.1 29.5  MCHC 31.0 32.8  RDW 16.1* 15.9*  PLT 232 151    Cardiac Enzymes  Recent Labs Lab 01/01/17 1609 01/01/17 2029 01/02/17 0226 01/02/17 1132  TROPONINI 3.26* 3.75* 3.91* 4.91*     Recent Labs Lab 01/01/17 1051  TROPIPOC 0.41*  BNP  Recent Labs Lab 01/01/17 1046  BNP 658.7*     Radiology    Dg Chest 1 View  Result Date: 01/02/2017 CLINICAL DATA:  Shortness of breath. EXAM: CHEST 1 VIEW COMPARISON:  January 02, 2016 FINDINGS: Stable cardiomediastinal silhouette and cardiomegaly. Bilateral pulmonary infiltrates are identified, similar in the interval. No other interval changes. IMPRESSION: Cardiomegaly. Diffuse bilateral pulmonary opacities persist, unchanged in the interval. Electronically Signed   By: Gerome Sam III M.D   On: 01/02/2017 07:20   Dg Chest Port 1 View  Result Date: 01/01/2017 CLINICAL DATA:  Respiratory distress. EXAM: PORTABLE CHEST 1 VIEW COMPARISON:  None. FINDINGS: Cardiomegaly. Bilateral pulmonary opacities, right greater than left. No pneumothorax. No other acute abnormalities. IMPRESSION: Diffuse bilateral pulmonary opacities, right greater than left. Opacity is more confluent and patchy in the right base. The findings could represent asymmetric edema but diffuse infection is not  completely excluded. Recommend clinical correlation and follow-up. Electronically Signed   By: Gerome Sam III M.D   On: 01/01/2017 10:54   Dg Abd Portable 1v  Result Date: 01/01/2017 CLINICAL DATA:  Shortness of breath and abdominal distention. EXAM: PORTABLE ABDOMEN - 1 VIEW COMPARISON:  None FINDINGS: Mild gaseous distension of the small and large bowel loops. Gas and stool noted throughout the colon up level the rectum. IMPRESSION: Nonobstructive bowel gas pattern. Electronically Signed   By: Signa Kell M.D.   On: 01/01/2017 14:52    Cardiac Studies   Echocardiogram pending.  Patient Profile     83 y.o. female with history of COPD, apparent old left bundle branch block, and reported history of "3 heart attacks" in the past. States that she followed with a physician in [place] for prior cardiac events, does not indicate any prior cardiac catheterization or PCI. She presents with worsening shortness of breath over the last week, predominantly exertional, some intermittent coughing. Chest x-ray is concerning for possible associated pneumonia versus asymmetric edema. She is on broad-spectrum antibiotics with additional pulmonary treatment per primary team. Cardiac enzymes suggest NSTEMI.  Assessment & Plan    1. NSTEMI, Troponin I up to 4.9. No active chest pain or shortness of breath at rest. ECG shows left bundle branch block which is reportedly old. No old tracing for comparison.  2. COPD with suspected exacerbation and possible associated community acquired pneumonia. Presented with hypoxic/hypercapnic respiratory failure which has improved, transiently on BiPAP. Management per primary team.  3. Renal insufficiency, creatinine 1.3. Baseline uncertain, potentially has CKD stage 2-3.  4. Hypokalemia, currently being repleted.  5. Type 2 diabetes mellitus, on Januvia as an outpatient.  6. Hyperlipidemia, on Pravachol as an outpatient.  7. Essential hypertension, on Cozaar as an  outpatient.  Follow-up echocardiogram to assess cardiac structure and function. Anticipate that she will need a diagnostic cardiac catheterization during this hospital stay once improved from a pulmonary perspective. Continue aspirin, resume statin therapy. ARB held temporarily. Can further tailor medical therapy based on echocardiogram results.  Signed, Nona Dell, MD  01/02/2017, 1:37 PM

## 2017-01-02 NOTE — Progress Notes (Addendum)
PROGRESS NOTE  Kelly [name] WJX:[id number] DOB: 10-30-1931 DOA: 01/01/2017 PCP: No primary care provider on file.   LOS: 1 day   Brief Narrative: Kelly [name] is a 83 y.o. female  with past medical history of CKD, dm, COPD, left bundle branch block and congestive heart failure presents to the emergency room with chief complaint of shortness of breath. Patient's son reports she looked more short of breath a few days prior to coming to the emergency room. Patient this morning had difficulty standing and fell. ABG in the emergency room was initially concerning with PCO2 of 95 and significant respiratory acidosis with pH of 7.06, this improved after BiPAP and was admitted to step down. She was also found to have a troponin elevation into the 3 range and cardiology was consulted.  Assessment & Plan: Principal Problem:   Acute respiratory failure (HCC) Active Problems:   COPD with acute exacerbation (HCC)   LBBB (left bundle branch block)   CKD (chronic kidney disease)   Acute respiratory failure with hypercapnia (HCC)   Chest pain   SOB (shortness of breath)   Troponin I above reference range   Acute respiratory failure with hypercapnia and hypoxia - likely multifactorial with CHF, COPD exacerbation. She was placed on empiric antibiotics with ceftriaxone and azithromycin, continue, scheduled duonebs, steroids. Improving today  COPD exacerbation / CAP - Continue steroids, antibiotics, nebulizers. Improving. Chest x-ray with bilateral pulmonary opacities. Respiratory status stable  Acute on chronic CHF - unknown past EF, 2D echo is pending. She was given Lasix yesterday in the emergency room, I&O's not accurately charted. Weight appears about the same, she has no significant lower extremity edema  LBBB, elevated troponin - LBBB does not appear to be new per patient. Cardiology following, she was placed on heparin infusion. Patient reports no chest pain however she complains of  intermittent left sided chest discomfort for weeks prior to coming in.   DM - SSI, A1c  Hypertension - hydralazine prn, BP normal this morning, continue to home home agents, may need change in regimen based on echo and cardiology input.  CKD - Unknown baseline, creatining appears stable this morning  Hyperlipidemia   DVT prophylaxis: heparin infusion Code Status: Full code Family Communication: no family bedside Disposition Plan: TBD  Consultants:   Cardiology   Procedures:   2D echo: pending   Antimicrobials:  Ceftriaxone 1/20 >>  Azithromycin 1/20 >>   Subjective: - no chest pain, shortness of breath, no abdominal pain, nausea or vomiting. Feels a lot better this morning   Objective: Vitals:   01/02/17 0014 01/02/17 0400 01/02/17 0723 01/02/17 0923  BP:   122/61   Pulse: 84 72 77 76  Resp: (!) 22 (!) 22 (!) 96 (!) 28  Temp:  97.8 F (36.6 C) 97 F (36.1 C)   TempSrc:  Oral Oral   SpO2: 96% (!) 84% 98% 98%  Weight:  84.2 kg (185 lb 11.2 oz)    Height:        Intake/Output Summary (Last 24 hours) at 01/02/17 1014 Last data filed at 01/02/17 0400  Gross per 24 hour  Intake           658.83 ml  Output                0 ml  Net           658.83 ml   Filed Weights   01/01/17 1706 01/02/17 0400  Weight: 84.2 kg (185 lb 11.2 oz)  84.2 kg (185 lb 11.2 oz)    Examination: Constitutional: NAD Vitals:   01/02/17 0014 01/02/17 0400 01/02/17 0723 01/02/17 0923  BP:   122/61   Pulse: 84 72 77 76  Resp: (!) 22 (!) 22 (!) 96 (!) 28  Temp:  97.8 F (36.6 C) 97 F (36.1 C)   TempSrc:  Oral Oral   SpO2: 96% (!) 84% 98% 98%  Weight:  84.2 kg (185 lb 11.2 oz)    Height:       Eyes: PERRL, lids and conjunctivae normal Respiratory: scattered wheezing, moves air well, normal respiratory effort Cardiovascular: Regular rate and rhythm, no murmurs / rubs / gallops. No LE edema. 2+ pedal pulses. Abdomen: no tenderness. Bowel sounds positive.  Musculoskeletal: no  clubbing / cyanosis.  Neurologic: Nonfocal.  Psychiatric: Normal judgment and insight. Alert and oriented x 3. Normal mood.    Data Reviewed: I have personally reviewed following labs and imaging studies  CBC:  Recent Labs Lab 01/01/17 1046 01/02/17 0226  WBC 20.8* 14.1*  NEUTROABS 9.5*  --   HGB 10.4* 8.4*  HCT 33.6* 25.6*  MCV 94.1 89.8  PLT 232 151   Basic Metabolic Panel:  Recent Labs Lab 01/01/17 1046 01/01/17 1609 01/02/17 0226  NA 142  --  142  K 3.5  --  2.9*  CL 105  --  104  CO2 25  --  27  GLUCOSE 163*  --  152*  BUN 23*  --  30*  CREATININE 1.37*  --  1.39*  CALCIUM 9.7  --  8.9  MG  --   --  2.1  PHOS  --  4.5  --    GFR: Estimated Creatinine Clearance: 34.2 mL/min (by C-G formula based on SCr of 1.39 mg/dL (H)). Liver Function Tests:  Recent Labs Lab 01/01/17 1046  AST 44*  ALT 57*  ALKPHOS 82  BILITOT 1.1  PROT 8.2*  ALBUMIN 3.9   No results for input(s): LIPASE, AMYLASE in the last 168 hours. No results for input(s): AMMONIA in the last 168 hours. Coagulation Profile: No results for input(s): INR, PROTIME in the last 168 hours. Cardiac Enzymes:  Recent Labs Lab 01/01/17 1609 01/01/17 2029 01/02/17 0226  TROPONINI 3.26* 3.75* 3.91*   BNP (last 3 results) No results for input(s): PROBNP in the last 8760 hours. HbA1C: No results for input(s): HGBA1C in the last 72 hours. CBG:  Recent Labs Lab 01/01/17 1741 01/01/17 2047 01/01/17 2339 01/02/17 0352 01/02/17 0722  GLUCAP 213* 200* 188* 142* 205*   Lipid Profile: No results for input(s): CHOL, HDL, LDLCALC, TRIG, CHOLHDL, LDLDIRECT in the last 72 hours. Thyroid Function Tests: No results for input(s): TSH, T4TOTAL, FREET4, T3FREE, THYROIDAB in the last 72 hours. Anemia Panel: No results for input(s): VITAMINB12, FOLATE, FERRITIN, TIBC, IRON, RETICCTPCT in the last 72 hours. Urine analysis:    Component Value Date/Time   COLORURINE YELLOW 01/01/2017 2352   APPEARANCEUR  CLEAR 01/01/2017 2352   LABSPEC 1.008 01/01/2017 2352   PHURINE 5.0 01/01/2017 2352   GLUCOSEU NEGATIVE 01/01/2017 2352   HGBUR SMALL (A) 01/01/2017 2352   BILIRUBINUR NEGATIVE 01/01/2017 2352   KETONESUR NEGATIVE 01/01/2017 2352   PROTEINUR NEGATIVE 01/01/2017 2352   NITRITE NEGATIVE 01/01/2017 2352   LEUKOCYTESUR NEGATIVE 01/01/2017 2352   Sepsis Labs: Invalid input(s): PROCALCITONIN, LACTICIDVEN  Recent Results (from the past 240 hour(s))  Respiratory Panel by PCR     Status: None   Collection Time: 01/01/17  2:40 PM  Result Value Ref Range Status   Adenovirus NOT DETECTED NOT DETECTED Final   Coronavirus 229E NOT DETECTED NOT DETECTED Final   Coronavirus HKU1 NOT DETECTED NOT DETECTED Final   Coronavirus NL63 NOT DETECTED NOT DETECTED Final   Coronavirus OC43 NOT DETECTED NOT DETECTED Final   Metapneumovirus NOT DETECTED NOT DETECTED Final   Rhinovirus / Enterovirus NOT DETECTED NOT DETECTED Final   Influenza A NOT DETECTED NOT DETECTED Final   Influenza B NOT DETECTED NOT DETECTED Final   Parainfluenza Virus 1 NOT DETECTED NOT DETECTED Final   Parainfluenza Virus 2 NOT DETECTED NOT DETECTED Final   Parainfluenza Virus 3 NOT DETECTED NOT DETECTED Final   Parainfluenza Virus 4 NOT DETECTED NOT DETECTED Final   Respiratory Syncytial Virus NOT DETECTED NOT DETECTED Final   Bordetella pertussis NOT DETECTED NOT DETECTED Final   Chlamydophila pneumoniae NOT DETECTED NOT DETECTED Final   Mycoplasma pneumoniae NOT DETECTED NOT DETECTED Final  MRSA PCR Screening     Status: None   Collection Time: 01/01/17  6:01 PM  Result Value Ref Range Status   MRSA by PCR NEGATIVE NEGATIVE Final    Comment:        The GeneXpert MRSA Assay (FDA approved for NASAL specimens only), is one component of a comprehensive MRSA colonization surveillance program. It is not intended to diagnose MRSA infection nor to guide or monitor treatment for MRSA infections.       Radiology Studies: Dg  Chest 1 View  Result Date: 01/02/2017 CLINICAL DATA:  Shortness of breath. EXAM: CHEST 1 VIEW COMPARISON:  January 02, 2016 FINDINGS: Stable cardiomediastinal silhouette and cardiomegaly. Bilateral pulmonary infiltrates are identified, similar in the interval. No other interval changes. IMPRESSION: Cardiomegaly. Diffuse bilateral pulmonary opacities persist, unchanged in the interval. Electronically Signed   By: Gerome Sam III M.D   On: 01/02/2017 07:20   Dg Chest Port 1 View  Result Date: 01/01/2017 CLINICAL DATA:  Respiratory distress. EXAM: PORTABLE CHEST 1 VIEW COMPARISON:  None. FINDINGS: Cardiomegaly. Bilateral pulmonary opacities, right greater than left. No pneumothorax. No other acute abnormalities. IMPRESSION: Diffuse bilateral pulmonary opacities, right greater than left. Opacity is more confluent and patchy in the right base. The findings could represent asymmetric edema but diffuse infection is not completely excluded. Recommend clinical correlation and follow-up. Electronically Signed   By: Gerome Sam III M.D   On: 01/01/2017 10:54   Dg Abd Portable 1v  Result Date: 01/01/2017 CLINICAL DATA:  Shortness of breath and abdominal distention. EXAM: PORTABLE ABDOMEN - 1 VIEW COMPARISON:  None FINDINGS: Mild gaseous distension of the small and large bowel loops. Gas and stool noted throughout the colon up level the rectum. IMPRESSION: Nonobstructive bowel gas pattern. Electronically Signed   By: Signa Kell M.D.   On: 01/01/2017 14:52     Scheduled Meds: . aspirin EC  325 mg Oral Daily  . azithromycin  500 mg Intravenous Q24H  . budesonide (PULMICORT) nebulizer solution  0.25 mg Nebulization BID  . cefTRIAXone (ROCEPHIN)  IV  1 g Intravenous Q24H  . insulin aspart  0-20 Units Subcutaneous Q4H  . ipratropium-albuterol  3 mL Nebulization Q4H  . mouth rinse  15 mL Mouth Rinse BID  . methylPREDNISolone (SOLU-MEDROL) injection  125 mg Intravenous Q6H  . potassium chloride  40  mEq Oral TID  . sodium chloride flush  3 mL Intravenous Q12H   Continuous Infusions: . albuterol 20 mg/hr (01/01/17 1142)  . heparin 1,000 Units/hr (01/02/17 0400)  Pamella Pert, MD, PhD [facility] Pager [phone]  If 7PM-7AM, please contact night-coverage [email or web address] Password [email or web address] 01/02/2017, 10:14 AM

## 2017-01-02 NOTE — Progress Notes (Signed)
RT instructed pt on the use of flutter valve.  Pt able to demonstrate back good technique. 

## 2017-01-03 ENCOUNTER — Inpatient Hospital Stay (HOSPITAL_COMMUNITY): Payer: Medicare Other

## 2017-01-03 DIAGNOSIS — I110 Hypertensive heart disease with heart failure: Secondary | ICD-10-CM

## 2017-01-03 DIAGNOSIS — N178 Other acute kidney failure: Secondary | ICD-10-CM

## 2017-01-03 DIAGNOSIS — J9602 Acute respiratory failure with hypercapnia: Secondary | ICD-10-CM

## 2017-01-03 DIAGNOSIS — R931 Abnormal findings on diagnostic imaging of heart and coronary circulation: Secondary | ICD-10-CM

## 2017-01-03 DIAGNOSIS — I119 Hypertensive heart disease without heart failure: Secondary | ICD-10-CM | POA: Diagnosis present

## 2017-01-03 LAB — GLUCOSE, CAPILLARY
GLUCOSE-CAPILLARY: 136 mg/dL — AB (ref 65–99)
GLUCOSE-CAPILLARY: 148 mg/dL — AB (ref 65–99)
GLUCOSE-CAPILLARY: 154 mg/dL — AB (ref 65–99)
GLUCOSE-CAPILLARY: 158 mg/dL — AB (ref 65–99)
Glucose-Capillary: 287 mg/dL — ABNORMAL HIGH (ref 65–99)
Glucose-Capillary: 177 mg/dL — ABNORMAL HIGH (ref 65–99)

## 2017-01-03 LAB — BASIC METABOLIC PANEL
ANION GAP: 13 (ref 5–15)
BUN: 45 mg/dL — ABNORMAL HIGH (ref 6–20)
CO2: 24 mmol/L (ref 22–32)
Calcium: 9.3 mg/dL (ref 8.9–10.3)
Chloride: 105 mmol/L (ref 101–111)
Creatinine, Ser: 1.61 mg/dL — ABNORMAL HIGH (ref 0.44–1.00)
GFR, EST AFRICAN AMERICAN: 33 mL/min — AB (ref >60)
GFR, EST NON AFRICAN AMERICAN: 28 mL/min — AB (ref >60)
Glucose, Bld: 159 mg/dL — ABNORMAL HIGH (ref 65–99)
POTASSIUM: 3.6 mmol/L (ref 3.5–5.1)
Sodium: 142 mmol/L (ref 135–145)

## 2017-01-03 LAB — CBC
HCT: 27.7 % — ABNORMAL LOW (ref 36.0–46.0)
Hemoglobin: 9.1 g/dL — ABNORMAL LOW (ref 12.0–15.0)
MCH: 29.4 pg (ref 26.0–34.0)
MCHC: 32.9 g/dL (ref 30.0–36.0)
MCV: 89.6 fL (ref 78.0–100.0)
PLATELETS: 187 10*3/uL (ref 150–400)
RBC: 3.09 MIL/uL — AB (ref 3.87–5.11)
RDW: 16.1 % — ABNORMAL HIGH (ref 11.5–15.5)
WBC: 30.3 10*3/uL — AB (ref 4.0–10.5)

## 2017-01-03 LAB — ECHOCARDIOGRAM COMPLETE
HEIGHTINCHES: 67 in
Weight: 2904 oz

## 2017-01-03 LAB — BRAIN NATRIURETIC PEPTIDE: B Natriuretic Peptide: 458.4 pg/mL — ABNORMAL HIGH (ref 0.0–100.0)

## 2017-01-03 LAB — HEPARIN LEVEL (UNFRACTIONATED): HEPARIN UNFRACTIONATED: 0.14 [IU]/mL — AB (ref 0.30–0.70)

## 2017-01-03 MED ORDER — BISOPROLOL FUMARATE 5 MG PO TABS
5.0000 mg | ORAL_TABLET | Freq: Every day | ORAL | Status: DC
  Administered 2017-01-03 – 2017-01-08 (×6): 5 mg via ORAL
  Filled 2017-01-03 (×6): qty 1

## 2017-01-03 MED ORDER — GUAIFENESIN-DM 100-10 MG/5ML PO SYRP: 5.0000 mL | ORAL_SOLUTION | ORAL | Status: DC | PRN

## 2017-01-03 MED ORDER — HEPARIN SODIUM (PORCINE) 5000 UNIT/ML IJ SOLN
5000.0000 [IU] | Freq: Three times a day (TID) | INTRAMUSCULAR | Status: DC
  Administered 2017-01-03 – 2017-01-07 (×13): 5000 [IU] via SUBCUTANEOUS
  Filled 2017-01-03 (×15): qty 1

## 2017-01-03 MED ORDER — GUAIFENESIN ER 600 MG PO TB12
600.0000 mg | ORAL_TABLET | Freq: Two times a day (BID) | ORAL | Status: DC
  Administered 2017-01-03 – 2017-01-08 (×11): 600 mg via ORAL
  Filled 2017-01-03 (×11): qty 1

## 2017-01-03 MED ORDER — FUROSEMIDE 10 MG/ML IJ SOLN
40.0000 mg | Freq: Once | INTRAMUSCULAR | Status: AC
  Administered 2017-01-03: 40 mg via INTRAVENOUS

## 2017-01-03 MED ORDER — METHYLPREDNISOLONE SODIUM SUCC 125 MG IJ SOLR
80.0000 mg | Freq: Four times a day (QID) | INTRAMUSCULAR | Status: DC
  Administered 2017-01-03 – 2017-01-04 (×4): 80 mg via INTRAVENOUS
  Filled 2017-01-03 (×4): qty 2

## 2017-01-03 MED ORDER — FUROSEMIDE 10 MG/ML IJ SOLN
INTRAMUSCULAR | Status: AC
  Filled 2017-01-03: qty 4

## 2017-01-03 MED ORDER — ASPIRIN EC 81 MG PO TBEC
81.0000 mg | DELAYED_RELEASE_TABLET | Freq: Every day | ORAL | Status: DC
  Administered 2017-01-03 – 2017-01-08 (×6): 81 mg via ORAL
  Filled 2017-01-03 (×6): qty 1

## 2017-01-03 MED ORDER — FUROSEMIDE 10 MG/ML IJ SOLN
80.0000 mg | Freq: Two times a day (BID) | INTRAMUSCULAR | Status: DC
  Administered 2017-01-03 (×2): 80 mg via INTRAVENOUS
  Filled 2017-01-03 (×2): qty 8

## 2017-01-03 NOTE — Progress Notes (Signed)
Pharmacist Heart Failure Core Measure Documentation  Assessment: Kelly [name] has an EF documented as 35-40% on 01/03/17 by echo.  Rationale: Heart failure patients with left ventricular systolic dysfunction (LVSD) and an EF < 40% should be prescribed an angiotensin converting enzyme inhibitor (ACEI) or angiotensin receptor blocker (ARB) at discharge unless a contraindication is documented in the medical record.  This patient is not currently on an ACEI or ARB for HF.  This note is being placed in the record in order to provide documentation that a contraindication to the use of these agents is present for this encounter.  ACE Inhibitor or Angiotensin Receptor Blocker is contraindicated (specify all that apply)  []   ACEI allergy AND ARB allergy []   Angioedema []   Moderate or severe aortic stenosis []   Hyperkalemia []   Hypotension []   Renal artery stenosis [x]   Worsening renal function, preexisting renal disease or dysfunction   Mackie Pai, PharmD PGY1 Pharmacy Resident Pager: [phone] 01/03/2017 1:26 PM

## 2017-01-03 NOTE — Progress Notes (Signed)
Patient called out stating she couldn't get her breath, respiratory gurgling heard from doorway. Respiratory therapist into see patient to give scheduled  breathing treatment, listening to patient she has coarse crackles thru out upper chest. Patient trying to cough up phelm without success, but her coughing produced bright red blood. [facility] notified of patients condition and Tama Gander, NP ordered a stat chest X-ray, Lasix 40mg  IV now and to contact cardiology to consult turning off Heparin gtt. Lasix 40mg  given stat and patient claimed she didn't feel the urge to void. Bladder scan showed 656 in bladder, Tama Gander, NP ordered a foley for acute urinary retention. of clear yellow urine extracted upon insertion of foley. Foley inserted by Cornelius Moras, RN's. Proper protocal followed by 3 RN's at bedside during foley insertion. Patient breathing better and crackles are now fine crackles heard thru out. Dr. Mayford Knife notified of bright hemoptosis and she ordered to turn off Heparin gtt. Pharmacy notified. Cardiologist to evaluate patient in AM. Will continue to monitor patient closely.

## 2017-01-03 NOTE — Progress Notes (Signed)
PROGRESS NOTE        PATIENT DETAILS Name: Kelly [name] Age: 83 y.o. Sex: female Date of Birth: 03-Aug-1931 Admit Date: 01/01/2017 Admitting Physician Haydee Salter, MD PCP:No primary care provider on file.   Note prepared by PA student Ms Clousing. Physical exam, assessment and plan discussed in detail and agree with plan outlined below.   Brief Narrative: Patient is a 83 y.o. female with h/o COPD, CKD, LBBB, and HTN. She came to the ER on Saturday due to SOB, thinking it was a COPD exacerbation. EKG revealed NSTEMI. She was admitted for further workup, and experienced episode of hemoptysis yesterday morning after using incentive spirometer. Cardiology stopped IV heparin. See details below.   Subjective: Patient is a 83 y.o. female with labored breathing this morning on nasal cannula 2 5L.    Assessment/Plan:  Acute respiratory failure (HCC): Due to h/o COPD exacerbated by infection. CXR showed persistent bilateral airspace opacities, and trace bilateral pleural effusions. Continue azithromycin and ceftriaxone. Continue O2 therapy wean as tolerated.     Bilateral Pleural Effusions: CXR showed persistent bilateral airspace opacities, with trace bilateral pleural effusions. Continue azithromycin and ceftriaxone.WBC 30.3, exacerbated by chronic steroid use for COPD, see below.  COPD with acute exacerbation (HCC):  Increased  Leukocytosis secondary to both COPD exacerbation  steroid use. Reduce solumedrol dose. Continue budesonide, ipratropium-albuterol. Monitor for sepsis..   NSTEMI:  Echo shows EF 35-40% with grade 2 diastolic dysfn. Possibly ischemic cardiomyopathy. Elevated PA pressure ( ) Troponin peaked at  4.91. Cardiology consult following. . IV heparin stopped due to episode of hemoptysis. Continue ASA, statin, bisoprolol. Cardiology is considering cardiac catheterization during this admission, but waiting for respiratory status to improve.  Continue monitoring via telemetry.   LBBB (left bundle branch block): Pre-existing as stated by patient. Continue to monitor with bisoprolol and telemetry.     Hypertensive heart disease: Echo findings as above. Continue with bisoprolol, statin, and hydralazine for BP control.   Ischemic cardiomyopathy. Congested on exam. Mildly elevated BNP. Will add Bid lasix ( 80 gm q12hr). Monitor strict I/O. ARB held for AKI   AKI on CKD (chronic kidney disease):   baseline roughly 1.39. Also has urinary retention. Monitor while on high dose lasix. Continue to monitor with daily BMP, and urine output. Continue bisoprolol, statin, and hydralazine for BP control. Holding ARB due to worsened renal function.  Acute urinary retention Foley placed in overnight with improved output.    T2DM: Glucose today 159. Hgb A1C on 01/01/17 was 5.5. Continue novoLOG sliding scale.    DVT Prophylaxis: IV heparin temporarily halted due to hemoptysis episodes.   Code Status: Full code  Family Communication: None  Disposition Plan: Remain inpatient  Antimicrobial agents: Anti-infectives    Start     Dose/Rate Route Frequency Ordered Stop   01/02/17 1400  cefTRIAXone (ROCEPHIN) 1 g in dextrose 5 % 50 mL IVPB     1 g 100 mL/hr over 30 Minutes Intravenous Every 24 hours 01/01/17 1943 01/08/17 1359   01/02/17 1400  azithromycin (ZITHROMAX) 500 mg in dextrose 5 % 250 mL IVPB     500 mg 250 mL/hr over 60 Minutes Intravenous Every 24 hours 01/01/17 1943     01/01/17 1230  cefTRIAXone (ROCEPHIN) 1 g in dextrose 5 % 50 mL IVPB     1 g 100 mL/hr over  30 Minutes Intravenous  Once 01/01/17 1222 01/01/17 1433   01/01/17 1230  azithromycin (ZITHROMAX) 500 mg in dextrose 5 % 250 mL IVPB     500 mg 250 mL/hr over 60 Minutes Intravenous  Once 01/01/17 1222 01/01/17 1346      Procedures: Echocardiogram  CONSULTS: Cardiology  Time spent: 35 minutes-Greater than 50% of this time was spent in counseling, explanation  of diagnosis, planning of further management, and coordination of care.  MEDICATIONS: Scheduled Meds: . aspirin EC  81 mg Oral Daily  . azithromycin  500 mg Intravenous Q24H  . bisoprolol  5 mg Oral Daily  . budesonide (PULMICORT) nebulizer solution  0.25 mg Nebulization BID  . cefTRIAXone (ROCEPHIN)  IV  1 g Intravenous Q24H  . furosemide  80 mg Intravenous BID  . guaiFENesin  600 mg Oral BID  . heparin  5,000 Units Subcutaneous Q8H  . insulin aspart  0-20 Units Subcutaneous Q4H  . ipratropium-albuterol  3 mL Nebulization Q4H  . mouth rinse  15 mL Mouth Rinse BID  . methylPREDNISolone (SOLU-MEDROL) injection  80 mg Intravenous Q6H  . pravastatin  40 mg Oral q1800  . sodium chloride flush  3 mL Intravenous Q12H   Continuous Infusions: . albuterol 20 mg/hr (01/01/17 1142)   PRN Meds:.acetaminophen **OR** acetaminophen, guaiFENesin-dextromethorphan, hydrALAZINE, ipratropium-albuterol, ketorolac, ondansetron **OR** ondansetron (ZOFRAN) IV   PHYSICAL EXAM: Vital signs: Vitals:   01/03/17 0431 01/03/17 0600 01/03/17 0804 01/03/17 1044  BP: 140/75 (!) 144/74 (!) 157/82 (!) 155/83  Pulse: 80 86 88 81  Resp: (!) 22 (!) 21 (!) 23 (!) 24  Temp: 98.4 F (36.9 C)  97.5 F (36.4 C)   TempSrc: Oral  Oral   SpO2: 97% 95% 97% 99%  Weight: 82.3 kg (181 lb 8 oz)     Height:       Filed Weights   01/01/17 1706 01/02/17 0400 01/03/17 0431  Weight: 84.2 kg (185 lb 11.2 oz) 84.2 kg (185 lb 11.2 oz) 82.3 kg (181 lb 8 oz)   Body mass index is 28.43 kg/m.   General appearance: Awake, alert, not in any distress. Speech Clear.  Eyes: PERRLA HEENT: Atraumatic and Normocephalic Neck: Supple, no JVD. No cervical lymphadenopathy.  Resp:coarse breath sounds with gurgling b/l, no ronchi or wheeze CVS: S1 S2 regular, no murmurs. ( poor heart sounds due to body habitus) GI: Bowel sounds present, Non tender and not distended with no gaurding, rigidity or rebound.No organomegaly Extremities: B/L  Lower Ext shows no edema, both legs are warm to touch Neurology:  Speech is clear Psychiatric: Normal judgment and insight. AAOx 3. Normal mood. Musculoskeletal:No digital cyanosis Skin:No Rash, warm and dry  I have personally reviewed following labs and imaging studies  LABORATORY DATA: CBC:  Recent Labs Lab 01/01/17 1046 01/02/17 0226 01/03/17 0246  WBC 20.8* 14.1* 30.3*  NEUTROABS 9.5*  --   --   HGB 10.4* 8.4* 9.1*  HCT 33.6* 25.6* 27.7*  MCV 94.1 89.8 89.6  PLT 232 151 187    Basic Metabolic Panel:  Recent Labs Lab 01/01/17 1046 01/01/17 1609 01/02/17 0226 01/03/17 0246  NA 142  --  142 142  K 3.5  --  2.9* 3.6  CL 105  --  104 105  CO2 25  --  27 24  GLUCOSE 163*  --  152* 159*  BUN 23*  --  30* 45*  CREATININE 1.37*  --  1.39* 1.61*  CALCIUM 9.7  --  8.9 9.3  MG  --   --  2.1  --   PHOS  --  4.5  --   --     GFR: Estimated Creatinine Clearance: 29.2 mL/min (by C-G formula based on SCr of 1.61 mg/dL (H)).  Liver Function Tests:  Recent Labs Lab 01/01/17 1046  AST 44*  ALT 57*  ALKPHOS 82  BILITOT 1.1  PROT 8.2*  ALBUMIN 3.9   No results for input(s): LIPASE, AMYLASE in the last 168 hours. No results for input(s): AMMONIA in the last 168 hours.  Coagulation Profile: No results for input(s): INR, PROTIME in the last 168 hours.  Cardiac Enzymes:  Recent Labs Lab 01/01/17 1609 01/01/17 2029 01/02/17 0226 01/02/17 1132  TROPONINI 3.26* 3.75* 3.91* 4.91*    BNP (last 3 results) No results for input(s): PROBNP in the last 8760 hours.  HbA1C:  Recent Labs  01/01/17 1609  HGBA1C 5.5    CBG:  Recent Labs Lab 01/02/17 1713 01/02/17 2022 01/03/17 0003 01/03/17 0433 01/03/17 0807  GLUCAP 202* 118* 158* 148* 136*    Lipid Profile: No results for input(s): CHOL, HDL, LDLCALC, TRIG, CHOLHDL, LDLDIRECT in the last 72 hours.  Thyroid Function Tests: No results for input(s): TSH, T4TOTAL, FREET4, T3FREE, THYROIDAB in the last  72 hours.  Anemia Panel: No results for input(s): VITAMINB12, FOLATE, FERRITIN, TIBC, IRON, RETICCTPCT in the last 72 hours.  Urine analysis:    Component Value Date/Time   COLORURINE YELLOW 01/01/2017 2352   APPEARANCEUR CLEAR 01/01/2017 2352   LABSPEC 1.008 01/01/2017 2352   PHURINE 5.0 01/01/2017 2352   GLUCOSEU NEGATIVE 01/01/2017 2352   HGBUR SMALL (A) 01/01/2017 2352   BILIRUBINUR NEGATIVE 01/01/2017 2352   KETONESUR NEGATIVE 01/01/2017 2352   PROTEINUR NEGATIVE 01/01/2017 2352   NITRITE NEGATIVE 01/01/2017 2352   LEUKOCYTESUR NEGATIVE 01/01/2017 2352    Sepsis Labs: Lactic Acid, Venous    Component Value Date/Time   LATICACIDVEN 4.4 (HH) 01/01/2017 1609    MICROBIOLOGY: Recent Results (from the past 240 hour(s))  Respiratory Panel by PCR     Status: None   Collection Time: 01/01/17  2:40 PM  Result Value Ref Range Status   Adenovirus NOT DETECTED NOT DETECTED Final   Coronavirus 229E NOT DETECTED NOT DETECTED Final   Coronavirus HKU1 NOT DETECTED NOT DETECTED Final   Coronavirus NL63 NOT DETECTED NOT DETECTED Final   Coronavirus OC43 NOT DETECTED NOT DETECTED Final   Metapneumovirus NOT DETECTED NOT DETECTED Final   Rhinovirus / Enterovirus NOT DETECTED NOT DETECTED Final   Influenza A NOT DETECTED NOT DETECTED Final   Influenza B NOT DETECTED NOT DETECTED Final   Parainfluenza Virus 1 NOT DETECTED NOT DETECTED Final   Parainfluenza Virus 2 NOT DETECTED NOT DETECTED Final   Parainfluenza Virus 3 NOT DETECTED NOT DETECTED Final   Parainfluenza Virus 4 NOT DETECTED NOT DETECTED Final   Respiratory Syncytial Virus NOT DETECTED NOT DETECTED Final   Bordetella pertussis NOT DETECTED NOT DETECTED Final   Chlamydophila pneumoniae NOT DETECTED NOT DETECTED Final   Mycoplasma pneumoniae NOT DETECTED NOT DETECTED Final  MRSA PCR Screening     Status: None   Collection Time: 01/01/17  6:01 PM  Result Value Ref Range Status   MRSA by PCR NEGATIVE NEGATIVE Final     Comment:        The GeneXpert MRSA Assay (FDA approved for NASAL specimens only), is one component of a comprehensive MRSA colonization surveillance program. It is not intended to diagnose MRSA infection  nor to guide or monitor treatment for MRSA infections.     RADIOLOGY STUDIES/RESULTS: Dg Chest 1 View  Result Date: 01/02/2017 CLINICAL DATA:  Shortness of breath. EXAM: CHEST 1 VIEW COMPARISON:  January 02, 2016 FINDINGS: Stable cardiomediastinal silhouette and cardiomegaly. Bilateral pulmonary infiltrates are identified, similar in the interval. No other interval changes. IMPRESSION: Cardiomegaly. Diffuse bilateral pulmonary opacities persist, unchanged in the interval. Electronically Signed   By: Gerome Sam III M.D   On: 01/02/2017 07:20   Dg Chest Port 1 View  Result Date: 01/03/2017 CLINICAL DATA:  83 year old female with hemoptysis, congestion, and shortness of breath. EXAM: PORTABLE CHEST 1 VIEW COMPARISON:  Chest radiograph dated 01/02/2017 FINDINGS: There is persistent bilateral airspace opacities. Trace bilateral pleural effusions may be present. There is no pneumothorax. There is stable mild cardiomegaly. No acute osseous pathology. IMPRESSION: Persistent bilateral airspace opacities. Clinical correlation and follow-up recommended. Electronically Signed   By: Elgie Collard M.D.   On: 01/03/2017 01:34   Dg Chest Port 1 View  Result Date: 01/01/2017 CLINICAL DATA:  Respiratory distress. EXAM: PORTABLE CHEST 1 VIEW COMPARISON:  None. FINDINGS: Cardiomegaly. Bilateral pulmonary opacities, right greater than left. No pneumothorax. No other acute abnormalities. IMPRESSION: Diffuse bilateral pulmonary opacities, right greater than left. Opacity is more confluent and patchy in the right base. The findings could represent asymmetric edema but diffuse infection is not completely excluded. Recommend clinical correlation and follow-up. Electronically Signed   By: Gerome Sam III  M.D   On: 01/01/2017 10:54   Dg Abd Portable 1v  Result Date: 01/01/2017 CLINICAL DATA:  Shortness of breath and abdominal distention. EXAM: PORTABLE ABDOMEN - 1 VIEW COMPARISON:  None FINDINGS: Mild gaseous distension of the small and large bowel loops. Gas and stool noted throughout the colon up level the rectum. IMPRESSION: Nonobstructive bowel gas pattern. Electronically Signed   By: Signa Kell M.D.   On: 01/01/2017 14:52     LOS: 2 days   Rose Clousing, PAS  [employer]  If 7PM-7AM, please contact night-coverage [email or web address] Password [email or web address] 01/03/2017, 11:30 AM

## 2017-01-03 NOTE — Progress Notes (Addendum)
Progress Note  Patient Name: Kelly [name] Date of Encounter: 01/03/2017  Primary Cardiologist: New (to establish with [facility] based practice)  Subjective   No active chest pain. Complains of worsening SOB overnight with hemoptysis. IV heparin stopped.   Inpatient Medications    Scheduled Meds: . aspirin EC  325 mg Oral Daily  . azithromycin  500 mg Intravenous Q24H  . budesonide (PULMICORT) nebulizer solution  0.25 mg Nebulization BID  . cefTRIAXone (ROCEPHIN)  IV  1 g Intravenous Q24H  . insulin aspart  0-20 Units Subcutaneous Q4H  . ipratropium-albuterol  3 mL Nebulization Q4H  . mouth rinse  15 mL Mouth Rinse BID  . methylPREDNISolone (SOLU-MEDROL) injection  125 mg Intravenous Q6H  . pravastatin  40 mg Oral q1800  . sodium chloride flush  3 mL Intravenous Q12H   Continuous Infusions: . albuterol 20 mg/hr (01/01/17 1142)   PRN Meds: acetaminophen **OR** acetaminophen, hydrALAZINE, ipratropium-albuterol, ketorolac, ondansetron **OR** ondansetron (ZOFRAN) IV   Vital Signs    Vitals:   01/03/17 0418 01/03/17 0431 01/03/17 0600 01/03/17 0804  BP:  140/75 (!) 144/74 (!) 157/82  Pulse:  80 86 88  Resp:  (!) 22 (!) 21 (!) 23  Temp:  98.4 F (36.9 C)  97.5 F (36.4 C)  TempSrc:  Oral  Oral  SpO2: 98% 97% 95% 97%  Weight:  181 lb 8 oz (82.3 kg)    Height:        Intake/Output Summary (Last 24 hours) at 01/03/17 0840 Last data filed at 01/03/17 0600  Gross per 24 hour  Intake             1260 ml  Output             3175 ml  Net            -1915 ml   Filed Weights   01/01/17 1706 01/02/17 0400 01/03/17 0431  Weight: 185 lb 11.2 oz (84.2 kg) 185 lb 11.2 oz (84.2 kg) 181 lb 8 oz (82.3 kg)    Telemetry    Procedure review telemetry which shows sinus rhythm with occasional PVCs and couplets. 12 second run of SVT at 22:53 last pm.  ECG    I personally reviewed the tracing from 01/01/2017 which shows sinus rhythm with left branch block.  Physical Exam    GEN: Elderly black female in moderate respiratory distress Neck: No JVD Cardiac: RRR, no murmurs, rubs, or gallops.  Respiratory: Decreased breath sounds, coarse BS GI: Soft, nontender, non-distended  MS: No edema; No deformity. Neuro:  AAOx3. Psych: Normal affect  Labs    Chemistry  Recent Labs Lab 01/01/17 1046 01/02/17 0226 01/03/17 0246  NA 142 142 142  K 3.5 2.9* 3.6  CL 105 104 105  CO2 25 27 24   GLUCOSE 163* 152* 159*  BUN 23* 30* 45*  CREATININE 1.37* 1.39* 1.61*  CALCIUM 9.7 8.9 9.3  PROT 8.2*  --   --   ALBUMIN 3.9  --   --   AST 44*  --   --   ALT 57*  --   --   ALKPHOS 82  --   --   BILITOT 1.1  --   --   GFRNONAA 35* 34* 28*  GFRAA 40* 39* 33*  ANIONGAP 12 11 13      Hematology  Recent Labs Lab 01/01/17 1046 01/02/17 0226 01/03/17 0246  WBC 20.8* 14.1* 30.3*  RBC 3.57* 2.85* 3.09*  HGB 10.4* 8.4* 9.1*  HCT 33.6* 25.6* 27.7*  MCV 94.1 89.8 89.6  MCH 29.1 29.5 29.4  MCHC 31.0 32.8 32.9  RDW 16.1* 15.9* 16.1*  PLT 232 151 187    Cardiac Enzymes  Recent Labs Lab 01/01/17 1609 01/01/17 2029 01/02/17 0226 01/02/17 1132  TROPONINI 3.26* 3.75* 3.91* 4.91*     Recent Labs Lab 01/01/17 1051  TROPIPOC 0.41*     BNP  Recent Labs Lab 01/01/17 1046  BNP 658.7*     Radiology    Dg Chest 1 View  Result Date: 01/02/2017 CLINICAL DATA:  Shortness of breath. EXAM: CHEST 1 VIEW COMPARISON:  January 02, 2016 FINDINGS: Stable cardiomediastinal silhouette and cardiomegaly. Bilateral pulmonary infiltrates are identified, similar in the interval. No other interval changes. IMPRESSION: Cardiomegaly. Diffuse bilateral pulmonary opacities persist, unchanged in the interval. Electronically Signed   By: Gerome Sam III M.D   On: 01/02/2017 07:20   Dg Chest Port 1 View  Result Date: 01/03/2017 CLINICAL DATA:  83 year old female with hemoptysis, congestion, and shortness of breath. EXAM: PORTABLE CHEST 1 VIEW COMPARISON:  Chest radiograph  dated 01/02/2017 FINDINGS: There is persistent bilateral airspace opacities. Trace bilateral pleural effusions may be present. There is no pneumothorax. There is stable mild cardiomegaly. No acute osseous pathology. IMPRESSION: Persistent bilateral airspace opacities. Clinical correlation and follow-up recommended. Electronically Signed   By: Elgie Collard M.D.   On: 01/03/2017 01:34   Dg Chest Port 1 View  Result Date: 01/01/2017 CLINICAL DATA:  Respiratory distress. EXAM: PORTABLE CHEST 1 VIEW COMPARISON:  None. FINDINGS: Cardiomegaly. Bilateral pulmonary opacities, right greater than left. No pneumothorax. No other acute abnormalities. IMPRESSION: Diffuse bilateral pulmonary opacities, right greater than left. Opacity is more confluent and patchy in the right base. The findings could represent asymmetric edema but diffuse infection is not completely excluded. Recommend clinical correlation and follow-up. Electronically Signed   By: Gerome Sam III M.D   On: 01/01/2017 10:54   Dg Abd Portable 1v  Result Date: 01/01/2017 CLINICAL DATA:  Shortness of breath and abdominal distention. EXAM: PORTABLE ABDOMEN - 1 VIEW COMPARISON:  None FINDINGS: Mild gaseous distension of the small and large bowel loops. Gas and stool noted throughout the colon up level the rectum. IMPRESSION: Nonobstructive bowel gas pattern. Electronically Signed   By: Signa Kell M.D.   On: 01/01/2017 14:52    Cardiac Studies   Echocardiogram pending.  Patient Profile     83 y.o. female with history of COPD, apparent old left bundle branch block, and reported history of "3 heart attacks" in the past. States that she followed with a physician in [place] for prior cardiac events, does not indicate any prior cardiac catheterization or PCI. She presents with worsening shortness of breath over the last week, with coughing. Chest x-ray is concerning for possible associated pneumonia versus asymmetric edema. She is on  broad-spectrum antibiotics with additional pulmonary treatment per primary team. Cardiac enzymes suggest NSTEMI.  Assessment & Plan    1. NSTEMI versus demand ischemia, Troponin I up to 4.9. No active chest pain. ECG shows left bundle branch block which is reportedly old. No old tracing for comparison. I reviewed Echo images this am. Final report pending. LV systolic function looks mildly reduced with some septal hypokinesis ? Due to LBBB. There is marked concentric LVH. biatrial enlargement. Moderate pulmonary HTN. IV heparin discontinued due to hemoptysis. Will continue ASA, statin, add selective beta blocker- bisoprolol. May need to consider cardiac cath this admission but will need to await improvement  in respiratory status and renal function.  2. COPD with  exacerbation and possible associated community acquired pneumonia. Presented with hypoxic/hypercapnic respiratory failure, transiently on BiPAP- now on [place]. Management per primary team. She may have a component of diastolic CHF with elevated BNP. She did receive IV lasix last pm with excellent diuretic response once foley placed.   3. Renal insufficiency, creatinine 1.3>>1.6. Baseline uncertain, potentially has CKD stage 2-3. Developed urinary retention last night and now has foley in place. Avoid ACEi/ARB.   4. Hypokalemia, currently being repleted.  5. Type 2 diabetes mellitus, on Januvia as an outpatient.  6. Hyperlipidemia, on Pravachol as an outpatient.  7. Hypertension with hypertensive heart disease- marked LVH, on Cozaar as an outpatient on hold for now due to ARF.  8. SVT- brief run- monitor on bisoprolol   Signed, Adian Jablonowski [place], MD  01/03/2017, 8:40 AM

## 2017-01-04 DIAGNOSIS — I5041 Acute combined systolic (congestive) and diastolic (congestive) heart failure: Secondary | ICD-10-CM

## 2017-01-04 LAB — BASIC METABOLIC PANEL
ANION GAP: 10 (ref 5–15)
BUN: 63 mg/dL — ABNORMAL HIGH (ref 6–20)
CO2: 28 mmol/L (ref 22–32)
Calcium: 8.6 mg/dL — ABNORMAL LOW (ref 8.9–10.3)
Chloride: 101 mmol/L (ref 101–111)
Creatinine, Ser: 1.75 mg/dL — ABNORMAL HIGH (ref 0.44–1.00)
GFR calc Af Amer: 30 mL/min — ABNORMAL LOW (ref >60)
GFR calc non Af Amer: 26 mL/min — ABNORMAL LOW (ref >60)
GLUCOSE: 129 mg/dL — AB (ref 65–99)
POTASSIUM: 3.2 mmol/L — AB (ref 3.5–5.1)
Sodium: 139 mmol/L (ref 135–145)

## 2017-01-04 LAB — GLUCOSE, CAPILLARY
GLUCOSE-CAPILLARY: 116 mg/dL — AB (ref 65–99)
GLUCOSE-CAPILLARY: 220 mg/dL — AB (ref 65–99)
GLUCOSE-CAPILLARY: 150 mg/dL — AB (ref 65–99)
Glucose-Capillary: 133 mg/dL — ABNORMAL HIGH (ref 65–99)
Glucose-Capillary: 145 mg/dL — ABNORMAL HIGH (ref 65–99)
Glucose-Capillary: 153 mg/dL — ABNORMAL HIGH (ref 65–99)

## 2017-01-04 LAB — CBC
HEMATOCRIT: 24.9 % — AB (ref 36.0–46.0)
Hemoglobin: 8.1 g/dL — ABNORMAL LOW (ref 12.0–15.0)
MCH: 29 pg (ref 26.0–34.0)
MCHC: 32.5 g/dL (ref 30.0–36.0)
MCV: 89.2 fL (ref 78.0–100.0)
Platelets: 169 10*3/uL (ref 150–400)
RBC: 2.79 MIL/uL — AB (ref 3.87–5.11)
RDW: 16.3 % — ABNORMAL HIGH (ref 11.5–15.5)
WBC: 21.2 10*3/uL — ABNORMAL HIGH (ref 4.0–10.5)

## 2017-01-04 MED ORDER — IPRATROPIUM-ALBUTEROL 0.5-2.5 (3) MG/3ML IN SOLN
3.0000 mL | Freq: Four times a day (QID) | RESPIRATORY_TRACT | Status: DC
  Administered 2017-01-04 (×2): 3 mL via RESPIRATORY_TRACT
  Filled 2017-01-04 (×3): qty 3

## 2017-01-04 MED ORDER — POTASSIUM CHLORIDE CRYS ER 20 MEQ PO TBCR
40.0000 meq | EXTENDED_RELEASE_TABLET | Freq: Once | ORAL | Status: AC
  Administered 2017-01-04: 40 meq via ORAL
  Filled 2017-01-04: qty 2

## 2017-01-04 MED ORDER — IPRATROPIUM-ALBUTEROL 0.5-2.5 (3) MG/3ML IN SOLN
3.0000 mL | Freq: Three times a day (TID) | RESPIRATORY_TRACT | Status: DC
  Administered 2017-01-05 – 2017-01-07 (×7): 3 mL via RESPIRATORY_TRACT
  Filled 2017-01-04 (×7): qty 3

## 2017-01-04 MED ORDER — VITAMIN B-12 1000 MCG PO TABS
1000.0000 ug | ORAL_TABLET | Freq: Every day | ORAL | Status: DC
  Administered 2017-01-04 – 2017-01-08 (×5): 1000 ug via ORAL
  Filled 2017-01-04 (×5): qty 1

## 2017-01-04 MED ORDER — PREDNISONE 50 MG PO TABS
50.0000 mg | ORAL_TABLET | Freq: Every day | ORAL | Status: DC
  Administered 2017-01-04 – 2017-01-08 (×4): 50 mg via ORAL
  Filled 2017-01-04 (×4): qty 1

## 2017-01-04 MED ORDER — FERROUS SULFATE 325 (65 FE) MG PO TABS
325.0000 mg | ORAL_TABLET | Freq: Three times a day (TID) | ORAL | Status: DC
  Administered 2017-01-04 – 2017-01-08 (×9): 325 mg via ORAL
  Filled 2017-01-04 (×9): qty 1

## 2017-01-04 NOTE — [place] FL2 (Signed)
[place] MEDICAID FL2 LEVEL OF CARE SCREENING TOOL     IDENTIFICATION  Patient Name: Kelly [name] Birthdate: Dec 12, 1931 Sex: female Admission Date (Current Location): 01/01/2017  [facility] and [place] Number:  Producer, television/film/video and Address:  [facility], [address] [place], [place] [address]      Provider Number: [id number]  Attending Physician Name and Address:  Eddie North, MD  Relative Name and Phone Number:       Current Level of Care: Hospital Recommended Level of Care: Skilled Nursing Facility Prior Approval Number:    Date Approved/Denied:   PASRR Number: [id number]  Discharge Plan: SNF    Current Diagnoses: Patient Active Problem List   Diagnosis Date Noted  . Hypertensive heart disease 01/03/2017  . Renal failure syndrome   . Cardiomyopathy, ischemic   . NSTEMI (non-ST elevated myocardial infarction) (HCC)   . Acute respiratory failure (HCC) 01/01/2017  . COPD with acute exacerbation (HCC) 01/01/2017  . LBBB (left bundle branch block) 01/01/2017  . CKD (chronic kidney disease) 01/01/2017  . Acute respiratory failure with hypercapnia (HCC) 01/01/2017  . Chest pain 01/01/2017  . SOB (shortness of breath)   . Troponin I above reference range     Orientation RESPIRATION BLADDER Height & Weight     Self, Situation, Place, Time  O2 (Nasal cannula 2L and has CPAP at home) Incontinent, Indwelling catheter Weight: 81.5 kg (179 lb 11.2 oz) Height:  5\' 7"  (170.2 cm)  BEHAVIORAL SYMPTOMS/MOOD NEUROLOGICAL BOWEL NUTRITION STATUS      Continent Diet (Please see DC Summary)  AMBULATORY STATUS COMMUNICATION OF NEEDS Skin   Limited Assist Verbally Normal                       Personal Care Assistance Level of Assistance  Bathing, Feeding, Dressing Bathing Assistance: Limited assistance Feeding assistance: Independent Dressing Assistance: Limited assistance     Functional Limitations Info             SPECIAL  CARE FACTORS FREQUENCY  PT (By licensed PT)     PT Frequency: 5x/week              Contractures      Additional Factors Info  Code Status, Allergies, Insulin Sliding Scale Code Status Info: Full Allergies Info: NKA   Insulin Sliding Scale Info: Every four hours       Current Medications (01/04/2017):  This is the current hospital active medication list Current Facility-Administered Medications  Medication Dose Route Frequency Provider Last Rate Last Dose  . acetaminophen (TYLENOL) tablet 650 mg  650 mg Oral Q6H PRN Haydee Salter, MD       Or  . acetaminophen (TYLENOL) suppository 650 mg  650 mg Rectal Q6H PRN Haydee Salter, MD      . albuterol (PROVENTIL,VENTOLIN) solution continuous neb  20 mg/hr Nebulization Continuous Geoffery Lyons, MD 4 mL/hr at 01/01/17 1142 20 mg/hr at 01/01/17 1142  . aspirin EC tablet 81 mg  81 mg Oral Daily Peter M Swaziland, MD   81 mg at 01/04/17 0914  . azithromycin (ZITHROMAX) 500 mg in dextrose 5 % 250 mL IVPB  500 mg Intravenous Q24H Haydee Salter, MD   500 mg at 01/04/17 1424  . bisoprolol (ZEBETA) tablet 5 mg  5 mg Oral Daily Peter M Swaziland, MD   5 mg at 01/04/17 0914  . budesonide (PULMICORT) nebulizer solution 0.25 mg  0.25 mg Nebulization BID Aneta Mins  Mahlon Gammon, MD   0.25 mg at 01/04/17 0958  . cefTRIAXone (ROCEPHIN) 1 g in dextrose 5 % 50 mL IVPB  1 g Intravenous Q24H Haydee Salter, MD   1 g at 01/04/17 1400  . ferrous sulfate tablet 325 mg  325 mg Oral TID WC Nishant Dhungel, MD   325 mg at 01/04/17 1651  . guaiFENesin (MUCINEX) 12 hr tablet 600 mg  600 mg Oral BID Nishant Dhungel, MD   600 mg at 01/04/17 0914  . guaiFENesin-dextromethorphan (ROBITUSSIN DM) 100-10 MG/5ML syrup 5 mL  5 mL Oral Q4H PRN Nishant Dhungel, MD      . heparin injection 5,000 Units  5,000 Units Subcutaneous Q8H Peter M Swazil[place], MD   5,000 Units at 01/04/17 1430  . hydrALAZINE (APRESOLINE) injection 10 mg  10 mg Intravenous Q8H PRN Haydee Salter, MD      . insulin  aspart (novoLOG) injection 0-20 Units  0-20 Units Subcutaneous Q4H Haydee Salter, MD   4 Units at 01/04/17 1651  . ipratropium-albuterol (DUONEB) 0.5-2.5 (3) MG/3ML nebulizer solution 3 mL  3 mL Nebulization Q2H PRN Haydee Salter, MD      . ipratropium-albuterol (DUONEB) 0.5-2.5 (3) MG/3ML nebulizer solution 3 mL  3 mL Nebulization Q6H Haydee Salter, MD   3 mL at 01/04/17 0958  . MEDLINE mouth rinse  15 mL Mouth Rinse BID Haydee Salter, MD   15 mL at 01/04/17 1000  . ondansetron (ZOFRAN) tablet 4 mg  4 mg Oral Q6H PRN Haydee Salter, MD       Or  . ondansetron [facility]) injection 4 mg  4 mg Intravenous Q6H PRN Haydee Salter, MD      . pravastatin (PRAVACHOL) tablet 40 mg  40 mg Oral q1800 Jonelle Sidle, MD   40 mg at 01/04/17 1651  . predniSONE (DELTASONE) tablet 50 mg  50 mg Oral Q breakfast Nishant Dhungel, MD   50 mg at 01/04/17 0914  . sodium chloride flush (NS) 0.9 % injection 3 mL  3 mL Intravenous Q12H Haydee Salter, MD   3 mL at 01/04/17 0915  . vitamin B-12 (CYANOCOBALAMIN) tablet 1,000 mcg  1,000 mcg Oral Daily Nishant Dhungel, MD   1,000 mcg at 01/04/17 1651     Discharge Medications: Please see discharge summary for a list of discharge medications.  Relevant Imaging Results:  Relevant Lab Results:   Additional Information SSN: [id number] [place], [place]

## 2017-01-04 NOTE — Care Management Note (Signed)
Case Management Note  Patient Details  Name: Kelly [name] MRN: [id number] Date of Birth: 09/21/1931  Subjective/Objective:  NSTEMI, COPD, CHF                 Action/Plan: Discharge Planning:  NCM spoke to pt and states she is originally from [place] and visiting with son, [name] # [phone] for past two months. Pt gave permission to contact son. Left HIPAA message on voicemail for return call. PT recommended SNF. Pt agreeable to SNF rehab. States both her son and dtr in law work. States she has her oxygen and CPAP her in [place]. Plan is to move to [place] permanently to live with son in [place]. Contacted, CSW referral for SNF placement.     Expected Discharge Date:                Expected Discharge Plan:  Skilled Nursing Facility  In-House Referral:  Clinical Social Work  Discharge planning Services  CM Consult  Post Acute Care Choice:  NA Choice offered to:  NA  DME Arranged:  N/A DME Agency:  NA  HH Arranged:  NA HH Agency:  NA  Status of Service:  Completed, signed off  If discussed at Long Length of Stay Meetings, dates discussed:    Additional Comments:  Elliot Cousin, RN 01/04/2017, 2:39 PM

## 2017-01-04 NOTE — Progress Notes (Signed)
Desats to 80's on room air. Back to o2 2l [place] . Cont. To monitor.

## 2017-01-04 NOTE — Progress Notes (Signed)
Transferred to 3 east room25 by wheelchair, stable. Report given to RN. Belongings with pt.

## 2017-01-04 NOTE — Progress Notes (Signed)
Page sent to Dr Gonzella Lex  "3e25, C.Gerstenberger Please revisit orders for CBG (remain q4?) and Bipap (d/c). "

## 2017-01-04 NOTE — Progress Notes (Signed)
Pt arrives to floor via wheelchair, pt is on 2L oxygen; tolerating well. Pt states that she does have a CPAP at home and uses 2 L of oxygen at home as needed/at night.   Pt is c/a/ox4. She denies pain/complaints. She reports visiting the area from [place], as her son lives in [place]. Pt states her symptoms prior to onset were gradually worsening x 1 week.

## 2017-01-04 NOTE — Progress Notes (Signed)
Progress Note  Patient Name: Kelly [name] Date of Encounter: 01/04/2017  Primary Cardiologist: New (to establish with [facility] based practice)  Subjective   No active chest pain. States her breathing is doing much better now.   Inpatient Medications    Scheduled Meds: . aspirin EC  81 mg Oral Daily  . azithromycin  500 mg Intravenous Q24H  . bisoprolol  5 mg Oral Daily  . budesonide (PULMICORT) nebulizer solution  0.25 mg Nebulization BID  . cefTRIAXone (ROCEPHIN)  IV  1 g Intravenous Q24H  . guaiFENesin  600 mg Oral BID  . heparin  5,000 Units Subcutaneous Q8H  . insulin aspart  0-20 Units Subcutaneous Q4H  . ipratropium-albuterol  3 mL Nebulization Q6H  . mouth rinse  15 mL Mouth Rinse BID  . pravastatin  40 mg Oral q1800  . predniSONE  50 mg Oral Q breakfast  . sodium chloride flush  3 mL Intravenous Q12H   Continuous Infusions: . albuterol 20 mg/hr (01/01/17 1142)   PRN Meds: acetaminophen **OR** acetaminophen, guaiFENesin-dextromethorphan, hydrALAZINE, ipratropium-albuterol, ondansetron **OR** ondansetron (ZOFRAN) IV   Vital Signs    Vitals:   01/04/17 0600 01/04/17 0700 01/04/17 0958 01/04/17 1100  BP: 126/69 138/70  134/66  Pulse: 65 70  64  Resp: 20 16  (!) 24  Temp:  98.4 F (36.9 C)  97.6 F (36.4 C)  TempSrc:  Oral  Oral  SpO2: 97% 95% 93% 94%  Weight:      Height:        Intake/Output Summary (Last 24 hours) at 01/04/17 1152 Last data filed at 01/04/17 0900  Gross per 24 hour  Intake              546 ml  Output             2900 ml  Net            -2354 ml   Filed Weights   01/02/17 0400 01/03/17 0431 01/04/17 0441  Weight: 185 lb 11.2 oz (84.2 kg) 181 lb 8 oz (82.3 kg) 179 lb 4.8 oz (81.3 kg)    Telemetry    Procedure review telemetry which shows sinus rhythm with rare PVCs  ECG    I personally reviewed the tracing from 01/01/2017 which shows sinus rhythm with left branch block.  Physical Exam   GEN: Elderly black female in  moderate respiratory distress Neck: No JVD Cardiac: RRR, no murmurs, rubs, or gallops.  Respiratory: clear GI: Soft, nontender, non-distended  MS: No edema; No deformity. Neuro:  AAOx3. Psych: Normal affect  Labs    Chemistry  Recent Labs Lab 01/01/17 1046 01/02/17 0226 01/03/17 0246 01/04/17 0251  NA 142 142 142 139  K 3.5 2.9* 3.6 3.2*  CL 105 104 105 101  CO2 25 27 24 28   GLUCOSE 163* 152* 159* 129*  BUN 23* 30* 45* 63*  CREATININE 1.37* 1.39* 1.61* 1.75*  CALCIUM 9.7 8.9 9.3 8.6*  PROT 8.2*  --   --   --   ALBUMIN 3.9  --   --   --   AST 44*  --   --   --   ALT 57*  --   --   --   ALKPHOS 82  --   --   --   BILITOT 1.1  --   --   --   GFRNONAA 35* 34* 28* 26*  GFRAA 40* 39* 33* 30*  ANIONGAP 12 11 13  10  Hematology  Recent Labs Lab 01/02/17 0226 01/03/17 0246 01/04/17 0251  WBC 14.1* 30.3* 21.2*  RBC 2.85* 3.09* 2.79*  HGB 8.4* 9.1* 8.1*  HCT 25.6* 27.7* 24.9*  MCV 89.8 89.6 89.2  MCH 29.5 29.4 29.0  MCHC 32.8 32.9 32.5  RDW 15.9* 16.1* 16.3*  PLT 151 187 169    Cardiac Enzymes  Recent Labs Lab 01/01/17 1609 01/01/17 2029 01/02/17 0226 01/02/17 1132  TROPONINI 3.26* 3.75* 3.91* 4.91*     Recent Labs Lab 01/01/17 1051  TROPIPOC 0.41*     BNP  Recent Labs Lab 01/01/17 1046 01/03/17 0906  BNP 658.7* 458.4*     Radiology    Dg Chest Port 1 View  Result Date: 01/03/2017 CLINICAL DATA:  83 year old female with hemoptysis, congestion, and shortness of breath. EXAM: PORTABLE CHEST 1 VIEW COMPARISON:  Chest radiograph dated 01/02/2017 FINDINGS: There is persistent bilateral airspace opacities. Trace bilateral pleural effusions may be present. There is no pneumothorax. There is stable mild cardiomegaly. No acute osseous pathology. IMPRESSION: Persistent bilateral airspace opacities. Clinical correlation and follow-up recommended. Electronically Signed   By: Elgie Collard M.D.   On: 01/03/2017 01:34    Cardiac Studies    Echocardiogram 01/03/17:Study Conclusions  - Left ventricle: The cavity size was normal. There was severe   concentric hypertrophy. Systolic function was moderately reduced.   The estimated ejection fraction was in the range of 35% to 40%.   Wall motion was normal; there were no regional wall motion   abnormalities. Features are consistent with a pseudonormal left   ventricular filling pattern, with concomitant abnormal relaxation   and increased filling pressure (grade 2 diastolic dysfunction).   Doppler parameters are consistent with elevated ventricular   end-diastolic filling pressure. - Aortic root: The aortic root was normal in size. - Mitral valve: There was mild regurgitation. - Left atrium: The atrium was moderately dilated. - Right ventricle: Systolic function was normal. - Right atrium: The atrium was moderately dilated. - Tricuspid valve: There was moderate regurgitation. - Pulmonic valve: There was mild regurgitation. - Pulmonary arteries: PA peak pressure: 62 mm Hg (S). - Inferior vena cava: The vessel was dilated. The respirophasic   diameter changes were blunted (< 50%), consistent with elevated   central venous pressure. - Pericardium, extracardiac: There was no pericardial effusion.  Impressions:  - Severe concentric left ventricular hypertrophy with mildly   decreased LVEF and diffuse hypokinesis.   A cardiac MRI is recommended to evaluate for possible   infiltrative cardiomyopathy.  Patient Profile     83 y.o. female with history of COPD, apparent old left bundle branch block, and reported history of "3 heart attacks" in the past. States that she followed with a physician in [place] for prior cardiac events, does not indicate any prior cardiac catheterization or PCI. She presents with worsening shortness of breath over the last week, with coughing. Chest x-ray is concerning for possible associated pneumonia versus asymmetric edema. She is on broad-spectrum  antibiotics with additional pulmonary treatment per primary team. Cardiac enzymes suggest NSTEMI. Diuresed with lasix.  Assessment & Plan    1. NSTEMI versus demand ischemia, Troponin I up to 4.9. No active chest pain. ECG shows left bundle branch block which is reportedly old. No old tracing for comparison. I reviewed Echo images personally.   LV systolic function looks mildly reduced- I would estimate EF of 45% with some septal hypokinesis ? Due to LBBB. There is marked concentric LVH. Biatrial enlargement. Moderate-severe pulmonary HTN.  IV heparin discontinued due to hemoptysis. Will continue ASA, statin, add selective beta blocker- bisoprolol. Not a candidate for ACEi/ARB due to acute on CKD.Marland Kitchen Would recommend right and left cardiac cath this admission but will need to await improvement in respiratory status and renal function.  2. COPD with  exacerbation and possible associated community acquired pneumonia. Presented with hypoxic/hypercapnic respiratory failure, transiently on BiPAP- now on [place]. Management per primary team.   3. Acute combined diastolic/systolic CHF. Marked LVH on Echo raises possibility of infiltrative disease. May consider MRI at some point. She has an excellent response to diuretics with I/O negative 4.3 liters and weight down 6 lbs.  Renal insufficiency, creatinine 1.3>>1.6>>1.75. Would hold lasix for now since she appears euvolemic now.  Baseline uncertain, potentially has CKD stage 2-3. Developed urinary retention last night and now has foley in place. Avoid ACEi/ARB. Monitor renal function closely.  4. Hypokalemia,  repleted.  5. Type 2 diabetes mellitus, on Januvia as an outpatient.  6. Hyperlipidemia, on Pravachol as an outpatient.  7. Hypertension with hypertensive heart disease- marked LVH, on Cozaar as an outpatient on hold for now due to ARF.  8. SVT- brief run- monitor on bisoprolol. No recurrence    Signed, Peter Swaziland, MD  01/04/2017, 11:52 AM

## 2017-01-04 NOTE — Progress Notes (Signed)
PROGRESS NOTE        PATIENT DETAILS Name: Kelly [name] Age: 83 y.o. Sex: female Date of Birth: 03-27-1931 Admit Date: 01/01/2017 Admitting Physician Haydee Salter, MD PCP:No primary care provider on file.  Brief Narrative: Patient is a 83 y.o. female with h/o COPD, CKD, LBBB, and HTN. She lives in [place], but was visiting her son in [place] when saturday she came to the ER due to SOB. EKG revealed NSTEMI and she was admitted for further workup. Sunday she experienced episodes of hemoptysis after using the incentive spirometer. Cardiology stopped IV heparin, but she is now on heparin 5,000units [place]. However, this morning she reported she still has occasional hemoptysis. See details below.    Subjective:  in pleasant mood this morning, and states she is feeling much better.  Breathing much improved with lasix and diuresed quite well.  Denies all of the following: Dizziness, N/V/D, abdominal pain, hematochezia, melena, and hematuresis.   Assessment/Plan:  Acute respiratory failure:  Due to h/o COPD exacerbated by infection and volume overlaod. CXR on 01/03/17, showed trace bilateral pleural effusions. Continue azithromycin and ceftriaxone.   Weaned O2 this morning.  wean as tolerated to goal of 88-92% O2 saturation on 2L.    Bilateral Pleural Effusions:  CXR showed trace bilateral pleural effusions. Leukocytosis trending down from 30.3 yesterday to 21.2, elevation partially due to chronic COPD steroid use, see details below.   Diuretic therapy tolerated well, chest congestion improved greatly. Furosemide was discontinued today. Continue azithromycin and ceftriaxone.   Echo shows EF 35-40% with severe LVH, recommends cardiac MRI ( cannot obtain due to renal fn)   COPD with acute exacerbation (HCC):  Switch to  prednisone p.o. Continue O2 therapy, wean as tolerated to goal of 88-92% on 2L.  Continue budesonide, albuterol, and ipratropium-albuterol.    NSTEMI (non-ST elevated myocardial infarction):   Echo shoed EF 35-40% with grade 2 diastolic dysfunction. Possibly ischemic cardiomyopathy. Elevated PA pressure was (62 mmhg), troponin peaked at 4.91. Continue ASA, statin, bisoprolol. IV heparin was held.  Cardiology consult is following, considering cardiac catheterization once renal fn improves..  transferred to telemetry floor.  Ischemic Cardiomyopathy:  Chest congestion improved greatly with BID lasix yesterday. Lasix d/c. Continue to monitor I/O. Continue to hold ARB due to AKI, see below. Continue to monitor with telemetry.   Hypertensive heart disease:  Echo findings as above. Continue with bisoprolol, statin, and hydralazine.  LBBB (left bundle branch block):  Pre-existing as stated by patient. Continue bisoprolol. Continued monitoring with telemetry.     AKI on CKD (chronic kidney disease):  sCR is 1.75 today. Average sCR over past 3 readings is 1.58. Lasix is discontinued. Continue ASA, statin, and bisoprolol. Continue to hold ARB as kidney function improves. Continue to monitor with daily BMP, and urine output.   Renal function expected to improve.   Acute urinary retention:  Required foley. Good UOP.  Acute Anemia:  Hgb trended down from 9.1 yesterday to 8.1 today. IV heparin was held to hemoptysis.  Continue to monitor Hgb. Has iron and b12 def , on supplements. Will resume home supplements.  T2DM:  cbg stable.. Hgb A1Con 01/01/17 was 5.5. Continue novoLOG sliding scale.   DVT Prophylaxis: Heparin injection 5,000 Units Q8H.  Code Status: Full code  Family Communication: Spoke with son at the bedside.   Disposition Plan: Transfer to  telemetry   Antimicrobial agents: Anti-infectives    Start     Dose/Rate Route Frequency Ordered Stop   01/02/17 1400  cefTRIAXone (ROCEPHIN) 1 g in dextrose 5 % 50 mL IVPB     1 g 100 mL/hr over 30 Minutes Intravenous Every 24 hours 01/01/17 1943 01/08/17 1359   01/02/17 1400   azithromycin (ZITHROMAX) 500 mg in dextrose 5 % 250 mL IVPB     500 mg 250 mL/hr over 60 Minutes Intravenous Every 24 hours 01/01/17 1943     01/01/17 1230  cefTRIAXone (ROCEPHIN) 1 g in dextrose 5 % 50 mL IVPB     1 g 100 mL/hr over 30 Minutes Intravenous  Once 01/01/17 1222 01/01/17 1433   01/01/17 1230  azithromycin (ZITHROMAX) 500 mg in dextrose 5 % 250 mL IVPB     500 mg 250 mL/hr over 60 Minutes Intravenous  Once 01/01/17 1222 01/01/17 1346      Procedures: None at this time  CONSULTS: Cardiology  PT evaluation  Time spent: 72minutes-Greater than 50% of this time was spent in counseling, explanation of diagnosis, planning of further management, and coordination of care.  MEDICATIONS: Scheduled Meds: . aspirin EC  81 mg Oral Daily  . azithromycin  500 mg Intravenous Q24H  . bisoprolol  5 mg Oral Daily  . budesonide (PULMICORT) nebulizer solution  0.25 mg Nebulization BID  . cefTRIAXone (ROCEPHIN)  IV  1 g Intravenous Q24H  . guaiFENesin  600 mg Oral BID  . heparin  5,000 Units Subcutaneous Q8H  . insulin aspart  0-20 Units Subcutaneous Q4H  . ipratropium-albuterol  3 mL Nebulization Q6H  . mouth rinse  15 mL Mouth Rinse BID  . pravastatin  40 mg Oral q1800  . predniSONE  50 mg Oral Q breakfast  . sodium chloride flush  3 mL Intravenous Q12H   Continuous Infusions: . albuterol 20 mg/hr (01/01/17 1142)   PRN Meds:.acetaminophen **OR** acetaminophen, guaiFENesin-dextromethorphan, hydrALAZINE, ipratropium-albuterol, ondansetron **OR** ondansetron (ZOFRAN) IV   PHYSICAL EXAM: Vital signs: Vitals:   01/04/17 0441 01/04/17 0600 01/04/17 0700 01/04/17 0958  BP:  126/69 138/70   Pulse:  65 70   Resp:  20 16   Temp:   98.4 F (36.9 C)   TempSrc:   Oral   SpO2:  97% 95% 93%  Weight: 81.3 kg (179 lb 4.8 oz)     Height:       Filed Weights   01/02/17 0400 01/03/17 0431 01/04/17 0441  Weight: 84.2 kg (185 lb 11.2 oz) 82.3 kg (181 lb 8 oz) 81.3 kg (179 lb 4.8 oz)    Body mass index is 28.08 kg/m.   General appearance: Awake, alert, not in any distress. Speech is clear, non- toxic looking.  Eyes: PERRLA, no scleral icterus. Pink conjunctiva.  HEENT: Atraumatic and Normocephalic Neck: Supple, no JVD. No cervical lymphadenopathy.   Resp: Lungs are clear to ausculation b/l.  CVS: S1, S2 regular, no murmurs. (poor heart sounds due to body habitus)  GI: Bowel sounds present. No abdominal guarding, rigidity, or positive rebound. No organomegaly.  Extremities: B/L LE show no signs of edema. Both legs are warm to palpation.   Neurology: Speech is clear.  Psychiatric: Normal judgement, and insight. AAOX3. Pleasant mood.  Musculoskeletal: No digital cyanosis.  Skin: No rash, skin is warm and dry to palpation.    I have personally reviewed following labs and imaging studies  LABORATORY DATA: CBC:  Recent Labs Lab 01/01/17 1046 01/02/17 0226 01/03/17  0246 01/04/17 0251  WBC 20.8* 14.1* 30.3* 21.2*  NEUTROABS 9.5*  --   --   --   HGB 10.4* 8.4* 9.1* 8.1*  HCT 33.6* 25.6* 27.7* 24.9*  MCV 94.1 89.8 89.6 89.2  PLT 232 151 187 169    Basic Metabolic Panel:  Recent Labs Lab 01/01/17 1046 01/01/17 1609 01/02/17 0226 01/03/17 0246 01/04/17 0251  NA 142  --  142 142 139  K 3.5  --  2.9* 3.6 3.2*  CL 105  --  104 105 101  CO2 25  --  27 24 28   GLUCOSE 163*  --  152* 159* 129*  BUN 23*  --  30* 45* 63*  CREATININE 1.37*  --  1.39* 1.61* 1.75*  CALCIUM 9.7  --  8.9 9.3 8.6*  MG  --   --  2.1  --   --   PHOS  --  4.5  --   --   --     GFR: Estimated Creatinine Clearance: 26.7 mL/min (by C-G formula based on SCr of 1.75 mg/dL (H)).  Liver Function Tests:  Recent Labs Lab 01/01/17 1046  AST 44*  ALT 57*  ALKPHOS 82  BILITOT 1.1  PROT 8.2*  ALBUMIN 3.9   No results for input(s): LIPASE, AMYLASE in the last 168 hours. No results for input(s): AMMONIA in the last 168 hours.  Coagulation Profile: No results for input(s): INR,  PROTIME in the last 168 hours.  Cardiac Enzymes:  Recent Labs Lab 01/01/17 1609 01/01/17 2029 01/02/17 0226 01/02/17 1132  TROPONINI 3.26* 3.75* 3.91* 4.91*    BNP (last 3 results) No results for input(s): PROBNP in the last 8760 hours.  HbA1C:  Recent Labs  01/01/17 1609  HGBA1C 5.5    CBG:  Recent Labs Lab 01/03/17 1541 01/03/17 1956 01/04/17 0045 01/04/17 0435 01/04/17 0814  GLUCAP 287* 177* 145* 133* 116*    Lipid Profile: No results for input(s): CHOL, HDL, LDLCALC, TRIG, CHOLHDL, LDLDIRECT in the last 72 hours.  Thyroid Function Tests: No results for input(s): TSH, T4TOTAL, FREET4, T3FREE, THYROIDAB in the last 72 hours.  Anemia Panel: No results for input(s): VITAMINB12, FOLATE, FERRITIN, TIBC, IRON, RETICCTPCT in the last 72 hours.  Urine analysis:    Component Value Date/Time   COLORURINE YELLOW 01/01/2017 2352   APPEARANCEUR CLEAR 01/01/2017 2352   LABSPEC 1.008 01/01/2017 2352   PHURINE 5.0 01/01/2017 2352   GLUCOSEU NEGATIVE 01/01/2017 2352   HGBUR SMALL (A) 01/01/2017 2352   BILIRUBINUR NEGATIVE 01/01/2017 2352   KETONESUR NEGATIVE 01/01/2017 2352   PROTEINUR NEGATIVE 01/01/2017 2352   NITRITE NEGATIVE 01/01/2017 2352   LEUKOCYTESUR NEGATIVE 01/01/2017 2352    Sepsis Labs: Lactic Acid, Venous    Component Value Date/Time   LATICACIDVEN 4.4 (HH) 01/01/2017 1609    MICROBIOLOGY: Recent Results (from the past 240 hour(s))  Respiratory Panel by PCR     Status: None   Collection Time: 01/01/17  2:40 PM  Result Value Ref Range Status   Adenovirus NOT DETECTED NOT DETECTED Final   Coronavirus 229E NOT DETECTED NOT DETECTED Final   Coronavirus HKU1 NOT DETECTED NOT DETECTED Final   Coronavirus NL63 NOT DETECTED NOT DETECTED Final   Coronavirus OC43 NOT DETECTED NOT DETECTED Final   Metapneumovirus NOT DETECTED NOT DETECTED Final   Rhinovirus / Enterovirus NOT DETECTED NOT DETECTED Final   Influenza A NOT DETECTED NOT DETECTED Final    Influenza B NOT DETECTED NOT DETECTED Final   Parainfluenza Virus  1 NOT DETECTED NOT DETECTED Final   Parainfluenza Virus 2 NOT DETECTED NOT DETECTED Final   Parainfluenza Virus 3 NOT DETECTED NOT DETECTED Final   Parainfluenza Virus 4 NOT DETECTED NOT DETECTED Final   Respiratory Syncytial Virus NOT DETECTED NOT DETECTED Final   Bordetella pertussis NOT DETECTED NOT DETECTED Final   Chlamydophila pneumoniae NOT DETECTED NOT DETECTED Final   Mycoplasma pneumoniae NOT DETECTED NOT DETECTED Final  MRSA PCR Screening     Status: None   Collection Time: 01/01/17  6:01 PM  Result Value Ref Range Status   MRSA by PCR NEGATIVE NEGATIVE Final    Comment:        The GeneXpert MRSA Assay (FDA approved for NASAL specimens only), is one component of a comprehensive MRSA colonization surveillance program. It is not intended to diagnose MRSA infection nor to guide or monitor treatment for MRSA infections.     RADIOLOGY STUDIES/RESULTS: Dg Chest 1 View  Result Date: 01/02/2017 CLINICAL DATA:  Shortness of breath. EXAM: CHEST 1 VIEW COMPARISON:  January 02, 2016 FINDINGS: Stable cardiomediastinal silhouette and cardiomegaly. Bilateral pulmonary infiltrates are identified, similar in the interval. No other interval changes. IMPRESSION: Cardiomegaly. Diffuse bilateral pulmonary opacities persist, unchanged in the interval. Electronically Signed   By: Gerome Sam III M.D   On: 01/02/2017 07:20   Dg Chest Port 1 View  Result Date: 01/03/2017 CLINICAL DATA:  83 year old female with hemoptysis, congestion, and shortness of breath. EXAM: PORTABLE CHEST 1 VIEW COMPARISON:  Chest radiograph dated 01/02/2017 FINDINGS: There is persistent bilateral airspace opacities. Trace bilateral pleural effusions may be present. There is no pneumothorax. There is stable mild cardiomegaly. No acute osseous pathology. IMPRESSION: Persistent bilateral airspace opacities. Clinical correlation and follow-up recommended.  Electronically Signed   By: Elgie Collard M.D.   On: 01/03/2017 01:34   Dg Chest Port 1 View  Result Date: 01/01/2017 CLINICAL DATA:  Respiratory distress. EXAM: PORTABLE CHEST 1 VIEW COMPARISON:  None. FINDINGS: Cardiomegaly. Bilateral pulmonary opacities, right greater than left. No pneumothorax. No other acute abnormalities. IMPRESSION: Diffuse bilateral pulmonary opacities, right greater than left. Opacity is more confluent and patchy in the right base. The findings could represent asymmetric edema but diffuse infection is not completely excluded. Recommend clinical correlation and follow-up. Electronically Signed   By: Gerome Sam III M.D   On: 01/01/2017 10:54   Dg Abd Portable 1v  Result Date: 01/01/2017 CLINICAL DATA:  Shortness of breath and abdominal distention. EXAM: PORTABLE ABDOMEN - 1 VIEW COMPARISON:  None FINDINGS: Mild gaseous distension of the small and large bowel loops. Gas and stool noted throughout the colon up level the rectum. IMPRESSION: Nonobstructive bowel gas pattern. Electronically Signed   By: Signa Kell M.D.   On: 01/01/2017 14:52     LOS: 2 days   Rose Clousing, PAS  [employer]  If 7PM-7AM, please contact night-coverage [email or web address] Password [facility] 01/04/2017, 10:46 AM

## 2017-01-04 NOTE — Evaluation (Signed)
Physical Therapy Evaluation Patient Details Name: Kelly [name] MRN: [id number] DOB: July 29, 1931 Today's Date: 01/04/2017   History of Present Illness  Pt adm with SOB and found to have NSTEMI versus demand ischemia. Also acute on chronic heart failure. PMH - HTN, DM, MI  Clinical Impression  Pt admitted with above diagnosis and presents to PT with functional limitations due to deficits listed below (See PT problem list). Pt needs skilled PT to maximize independence and safety to allow discharge to SNF vs return to son's home. Family works so pt alone during the day. Appears she will be here several days for work up so she very well may progress to the point of being able to return to son's home.     Follow Up Recommendations SNF (may progress rapidly and be able to return home)    Equipment Recommendations  Rolling walker with 5" wheels    Recommendations for Other Services       Precautions / Restrictions Precautions Precautions: Fall Restrictions Weight Bearing Restrictions: No      Mobility  Bed Mobility Overal bed mobility: Needs Assistance Bed Mobility: Supine to Sit     Supine to sit: Min guard     General bed mobility comments: for lines/safety  Transfers Overall transfer level: Needs assistance Equipment used: Rolling walker (2 wheeled) Transfers: Sit to/from Stand Sit to Stand: Min assist         General transfer comment: Assist to bring hips up and for balance. Verbal cues for hand placement.  Ambulation/Gait Ambulation/Gait assistance: Min assist;+2 safety/equipment Ambulation Distance (Feet): 15 Feet Assistive device: Rolling walker (2 wheeled) Gait Pattern/deviations: Step-through pattern;Decreased step length - right;Decreased step length - left;Trunk flexed Gait velocity: decr Gait velocity interpretation: Below normal speed for age/gender General Gait Details: Assist for balance and support. Amb on O2 with SpO2 89% after amb. Fatigues  quickly.  Stairs            Wheelchair Mobility    Modified Rankin (Stroke Patients Only)       Balance Overall balance assessment: Needs assistance Sitting-balance support: No upper extremity supported;Feet supported Sitting balance-Leahy Scale: Good     Standing balance support: Bilateral upper extremity supported Standing balance-Leahy Scale: Poor Standing balance comment: walker and min guard                             Pertinent Vitals/Pain Pain Assessment: 0-10 Pain Score: 5  Pain Location: back Pain Descriptors / Indicators: Discomfort Pain Intervention(s): Limited activity within patient's tolerance    Home Living Family/patient expects to be discharged to:: Private residence Living Arrangements: Children Available Help at Discharge: Family;Available PRN/intermittently Type of Home: House       Home Layout: Two level;Bed/bath upstairs Home Equipment: Cane - single point      Prior Function Level of Independence: Independent with assistive device(s)               Hand Dominance   Dominant Hand: Right    Extremity/Trunk Assessment   Upper Extremity Assessment Upper Extremity Assessment: Generalized weakness    Lower Extremity Assessment Lower Extremity Assessment: Generalized weakness       Communication   Communication: No difficulties  Cognition Arousal/Alertness: Awake/alert Behavior During Therapy: WFL for tasks assessed/performed Overall Cognitive Status: Within Functional Limits for tasks assessed                      General Comments  Exercises     Assessment/Plan    PT Assessment Patient needs continued PT services  PT Problem List Decreased strength;Decreased activity tolerance;Decreased balance;Decreased mobility;Decreased knowledge of use of DME          PT Treatment Interventions DME instruction;Gait training;Stair training;Functional mobility training;Therapeutic  activities;Therapeutic exercise;Balance training;Patient/family education    PT Goals (Current goals can be found in the Care Plan section)  Acute Rehab PT Goals Patient Stated Goal: Return to son's home PT Goal Formulation: With patient Time For Goal Achievement: 01/18/17 Potential to Achieve Goals: Good    Frequency Min 3X/week   Barriers to discharge Inaccessible home environment;Decreased caregiver support son works and pt bedroom upstairs    Co-evaluation               End of Session Equipment Utilized During Treatment: Gait belt;Oxygen Activity Tolerance: Patient limited by fatigue Patient left: in chair;with call bell/phone within reach;with chair alarm set Nurse Communication: Mobility status         Time: 6283-1517 PT Time Calculation (min) (ACUTE ONLY): 20 min   Charges:   PT Evaluation $PT Eval Moderate Complexity: 1 Procedure     PT G CodesAngelina Ok [name] 2017-01-24, 1:52 PM [employer] PT [phone]

## 2017-01-04 NOTE — Progress Notes (Signed)
Pt refused to use cpap as ordered. RPT also made aware

## 2017-01-05 DIAGNOSIS — I255 Ischemic cardiomyopathy: Secondary | ICD-10-CM

## 2017-01-05 LAB — CBC
HCT: 26.3 % — ABNORMAL LOW (ref 36.0–46.0)
HEMOGLOBIN: 8.4 g/dL — AB (ref 12.0–15.0)
MCH: 28.9 pg (ref 26.0–34.0)
MCHC: 31.9 g/dL (ref 30.0–36.0)
MCV: 90.4 fL (ref 78.0–100.0)
Platelets: 196 10*3/uL (ref 150–400)
RBC: 2.91 MIL/uL — AB (ref 3.87–5.11)
RDW: 16.3 % — ABNORMAL HIGH (ref 11.5–15.5)
WBC: 18.5 10*3/uL — ABNORMAL HIGH (ref 4.0–10.5)

## 2017-01-05 LAB — BASIC METABOLIC PANEL
ANION GAP: 13 (ref 5–15)
BUN: 69 mg/dL — ABNORMAL HIGH (ref 6–20)
CALCIUM: 9 mg/dL (ref 8.9–10.3)
CO2: 26 mmol/L (ref 22–32)
Chloride: 102 mmol/L (ref 101–111)
Creatinine, Ser: 1.5 mg/dL — ABNORMAL HIGH (ref 0.44–1.00)
GFR, EST AFRICAN AMERICAN: 36 mL/min — AB (ref >60)
GFR, EST NON AFRICAN AMERICAN: 31 mL/min — AB (ref >60)
Glucose, Bld: 146 mg/dL — ABNORMAL HIGH (ref 65–99)
Potassium: 4.3 mmol/L (ref 3.5–5.1)
Sodium: 141 mmol/L (ref 135–145)

## 2017-01-05 LAB — GLUCOSE, CAPILLARY
GLUCOSE-CAPILLARY: 141 mg/dL — AB (ref 65–99)
GLUCOSE-CAPILLARY: 115 mg/dL — AB (ref 65–99)
GLUCOSE-CAPILLARY: 164 mg/dL — AB (ref 65–99)
Glucose-Capillary: 197 mg/dL — ABNORMAL HIGH (ref 65–99)

## 2017-01-05 MED ORDER — SODIUM CHLORIDE 0.9 % IV SOLN
INTRAVENOUS | Status: DC
  Administered 2017-01-06: 05:00:00 via INTRAVENOUS

## 2017-01-05 MED ORDER — ASPIRIN 81 MG PO CHEW
81.0000 mg | CHEWABLE_TABLET | ORAL | Status: AC
  Administered 2017-01-06: 81 mg via ORAL
  Filled 2017-01-05: qty 1

## 2017-01-05 NOTE — Progress Notes (Signed)
Patient has refused CPAP HS tonight. Patient in no distress at this time.  

## 2017-01-05 NOTE — Clinical Social Work Note (Signed)
Clinical Social Work Assessment  Patient Details  Name: Kelly [name] MRN: [id number] Date of Birth: 20-Aug-1931  Date of referral:  01/05/17               Reason for consult:  Facility Placement, Discharge Planning                Permission sought to share information with:  Facility Sport and exercise psychologist, Family Supports Permission granted to share information::  Yes, Verbal Permission Granted  Name::     Kelly [name]  Agency::  SNF's  Relationship::  Son  Contact Information:  [phone]  Housing/Transportation Living arrangements for the past 2 months:  [place] of Information:  Patient, Medical Team Patient Interpreter Needed:  None Criminal Activity/Legal Involvement Pertinent to Current Situation/Hospitalization:  No - Comment as needed Significant Relationships:  Adult Children Lives with:  Adult Children Do you feel safe going back to the place where you live?  Yes Need for family participation in patient care:  Yes (Comment)  Care giving concerns:  PT recommending SNF once medically stable for discharge.   Social Worker assessment / plan:  CSW met with patient. No supports at bedside. CSW introduced role and explained that PT recommendations would be discussed. Patient agreeable to SNF and said her son was already working on it. CSW explained that CSW would have to be the one to send the referral. Patient expressed understanding. Patient has no preference on facility. CSW left SNF list for patient and her son to review. No further concerns. CSW encouraged patient to contact CSW as needed. CSW will continue to follow patient for support and facilitate discharge to SNF once medically stable.  Employment status:  Retired Forensic scientist:  Medicare PT Recommendations:  [name] / Referral to community resources:  [place]  Patient/Family's Response to care:  Patient agreeable to SNF placement. Patient's  son supportive and involved in patient's care. Patient appreciated social work intervention.  Patient/Family's Understanding of and Emotional Response to Diagnosis, Current Treatment, and Prognosis:  Patient appears to have a good understanding of the reason for her hospitalization. Patient appears pleased with hospital care.  Emotional Assessment Appearance:  Appears stated age Attitude/Demeanor/Rapport:  Other (Quiet) Affect (typically observed):  Accepting, Withdrawn, Quiet Orientation:  Oriented to Self, Oriented to Place, Oriented to  Time, Oriented to Situation Alcohol / Substance use:  Never Used Psych involvement (Current and /or in the community):  No (Comment)  Discharge Needs  Concerns to be addressed:  Care Coordination Readmission within the last 30 days:  No Current discharge risk:  Dependent with Mobility Barriers to Discharge:  Continued Medical Work up   Candie Chroman, LCSW 01/05/2017, 10:57 AM

## 2017-01-05 NOTE — Clinical Social Work Placement (Signed)
   CLINICAL SOCIAL WORK PLACEMENT  NOTE  Date:  01/05/2017  Patient Details  Name: Kelly [name] MRN: [id number] Date of Birth: 12-29-1930  Clinical Social Work is seeking post-discharge placement for this patient at the Skilled  Nursing Facility level of care (*CSW will initial, date and re-position this form in  chart as items are completed):  Yes   Patient/family provided with [place] [facility] Clinical Social Work Department's list of facilities offering this level of care within the geographic area requested by the patient (or if unable, by the patient's family).  Yes   Patient/family informed of their freedom to choose among providers that offer the needed level of care, that participate in Medicare, Medicaid or managed care program needed by the patient, have an available bed and are willing to accept the patient.  Yes   Patient/family informed of [facility]'s ownership interest in [facility] and [facility], as well as of the fact that they are under no obligation to receive care at these facilities.  PASRR submitted to EDS on 01/04/17     PASRR number received on 01/04/17     Existing PASRR number confirmed on       FL2 transmitted to all facilities in geographic area requested by pt/family on 01/04/17     FL2 transmitted to all facilities within larger geographic area on       Patient informed that his/her managed care company has contracts with or will negotiate with certain facilities, including the following:            Patient/family informed of bed offers received.  Patient chooses bed at       Physician recommends and patient chooses bed at      Patient to be transferred to   on  .  Patient to be transferred to facility by       Patient family notified on   of transfer.  Name of family member notified:        PHYSICIAN       Additional Comment:    _______________________________________________ Margarito Liner, LCSW 01/05/2017, 10:59 AM

## 2017-01-05 NOTE — Progress Notes (Signed)
PROGRESS NOTE                                                                                                                                                                                                             Patient Demographics:    Kelly [name], is a 83 y.o. female, DOB - 09-Mar-1931, ZOX:[id number]  Admit date - 01/01/2017   Admitting Physician Haydee Salter, MD  Outpatient Primary MD for the patient is No primary care provider on file.  LOS - 3  Outpatient Specialists:None  No chief complaint on file.      Brief Narrative   Patient is a 83 y.o. female with h/o COPD, CKD, LBBB, and HTN. She lives in [place], but was visiting her son in [place] when saturday she came to the ER due to SOB. EKG revealed NSTEMI and she was admitted for further workup. Hospital course prolonged due to acute pulmonary edema and worsening of renal function.   Subjective:   Breathing continues to improve. Complains of low back pain.   Assessment  & Plan :   Acute respiratory failure:  COPD exacerbation and acute pulmonary edema. Improved with IV Lasix. Continue empiric antibiotics. Transition to oral prednisone. When necessary nebs. Wean oxygen as tolerated.   Bilateral Pleural Effusions:  Secondary to acute pulmonary edema. Responded to IV Lasix. Now much improved and diuresed well (5L due to since admission) Echo shows EF 35-40% with severe LVH, recommends cardiac MRI ( cannot obtain due to renal fn)   COPD with acute exacerbation (HCC):  As outlined above. Continue oral prednisone, with oxygen as tolerated. When necessary nebs and antitussives.  NSTEMI (non-ST elevated myocardial infarction):   Echo shoed EF 35-40% with grade 2 diastolic dysfunction. Possibly ischemic cardiomyopathy. Elevated PA pressure was (62 mmhg), troponin peaked at 4.91.  Continue ASA, statin, bisoprolol. The heparin discontinued due to  hemoptysis. Cardiology consult pending a cardiac cath once renal function stabilizes.     Ischemic Cardiomyopathy:  Asymptomatic at present. Continue to hold ARB due to AKA. Off Lasix.  Hypertensive heart disease:  Continue with bisoprolol, statin, and hydralazine.  LBBB (left bundle branch block):  Pre-existing as stated by patient. Continue bisoprolol. Continued monitoring with telemetry.     AKI on CKD (chronic kidney disease):  Possibly secondary to urinary retention.syuyspect underlying CKD stage 3. Approaching  baseline.  Acute urinary retention:  Required foley during hospitalization. Has good urine output. Plan voiding trial prior to discharge.  Iron deficiency/B12 deficiency anemia. Heparin drip discontinued due to hemoptysis. H&H stable On iron and B12 supplement which has been resumed.  Type 2  diabetes mellitus cbg stable.. Hgb A1C on 01/01/17 was 5.5. Continue novoLOG sliding scale.     Code Status : Full Code  Family Communication  : None aside  Disposition Plan  : Pending cardiac workup (cardiac cath)  Barriers For Discharge : Proving symptoms  Consults  :  Cardiology  Procedures  : 2-D echo  DVT Prophylaxis  : Heparin  Lab Results  Component Value Date   PLT 196 01/05/2017    Antibiotics  :    Anti-infectives    Start     Dose/Rate Route Frequency Ordered Stop   01/02/17 1400  cefTRIAXone (ROCEPHIN) 1 g in dextrose 5 % 50 mL IVPB     1 g 100 mL/hr over 30 Minutes Intravenous Every 24 hours 01/01/17 1943 01/08/17 1359   01/02/17 1400  azithromycin (ZITHROMAX) 500 mg in dextrose 5 % 250 mL IVPB     500 mg 250 mL/hr over 60 Minutes Intravenous Every 24 hours 01/01/17 1943     01/01/17 1230  cefTRIAXone (ROCEPHIN) 1 g in dextrose 5 % 50 mL IVPB     1 g 100 mL/hr over 30 Minutes Intravenous  Once 01/01/17 1222 01/01/17 1433   01/01/17 1230  azithromycin (ZITHROMAX) 500 mg in dextrose 5 % 250 mL IVPB     500 mg 250 mL/hr over 60 Minutes  Intravenous  Once 01/01/17 1222 01/01/17 1346        Objective:   Vitals:   01/04/17 2313 01/05/17 0427 01/05/17 0742 01/05/17 0752  BP: (!) 147/65 140/72 128/68   Pulse: 65 (!) 58 (!) 54   Resp: 20 20 20    Temp: 98.1 F (36.7 C) 98.2 F (36.8 C) 98.1 F (36.7 C)   TempSrc: Oral Oral Oral   SpO2: 96% 99% 99% 94%  Weight:  80.5 kg (177 lb 6.4 oz)    Height:        Wt Readings from Last 3 Encounters:  01/05/17 80.5 kg (177 lb 6.4 oz)     Intake/Output Summary (Last 24 hours) at 01/05/17 1124 Last data filed at 01/05/17 1010  Gross per 24 hour  Intake              720 ml  Output             1620 ml  Net             -900 ml     Physical Exam  Gen: not in distress HEENT: , moist mucosa, supple neck Chest: clear b/l, no added sounds CVS: N S1&S2, no murmurs GI: soft, NT, ND,  Musculoskeletal: warm, no edema CNS: alert and oriented    Data Review:    CBC  Recent Labs Lab 01/01/17 1046 01/02/17 0226 01/03/17 0246 01/04/17 0251 01/05/17 0543  WBC 20.8* 14.1* 30.3* 21.2* 18.5*  HGB 10.4* 8.4* 9.1* 8.1* 8.4*  HCT 33.6* 25.6* 27.7* 24.9* 26.3*  PLT 232 151 187 169 196  MCV 94.1 89.8 89.6 89.2 90.4  MCH 29.1 29.5 29.4 29.0 28.9  MCHC 31.0 32.8 32.9 32.5 31.9  RDW 16.1* 15.9* 16.1* 16.3* 16.3*  LYMPHSABS 6.0*  --   --   --   --   MONOABS 0.6  --   --   --   --  EOSABS 4.6*  --   --   --   --   BASOSABS 0.1  --   --   --   --     Chemistries   Recent Labs Lab 01/01/17 1046 01/02/17 0226 01/03/17 0246 01/04/17 0251 01/05/17 0543  NA 142 142 142 139 141  K 3.5 2.9* 3.6 3.2* 4.3  CL 105 104 105 101 102  CO2 25 27 24 28 26   GLUCOSE 163* 152* 159* 129* 146*  BUN 23* 30* 45* 63* 69*  CREATININE 1.37* 1.39* 1.61* 1.75* 1.50*  CALCIUM 9.7 8.9 9.3 8.6* 9.0  MG  --  2.1  --   --   --   AST 44*  --   --   --   --   ALT 57*  --   --   --   --   ALKPHOS 82  --   --   --   --   BILITOT 1.1  --   --   --   --     ------------------------------------------------------------------------------------------------------------------ No results for input(s): CHOL, HDL, LDLCALC, TRIG, CHOLHDL, LDLDIRECT in the last 72 hours.  Lab Results  Component Value Date   HGBA1C 5.5 01/01/2017   ------------------------------------------------------------------------------------------------------------------ No results for input(s): TSH, T4TOTAL, T3FREE, THYROIDAB in the last 72 hours.  Invalid input(s): FREET3 ------------------------------------------------------------------------------------------------------------------ No results for input(s): VITAMINB12, FOLATE, FERRITIN, TIBC, IRON, RETICCTPCT in the last 72 hours.  Coagulation profile No results for input(s): INR, PROTIME in the last 168 hours.  No results for input(s): DDIMER in the last 72 hours.  Cardiac Enzymes  Recent Labs Lab 01/01/17 2029 01/02/17 0226 01/02/17 1132  TROPONINI 3.75* 3.91* 4.91*   ------------------------------------------------------------------------------------------------------------------    Component Value Date/Time   BNP 458.4 (H) 01/03/2017 0906    Inpatient Medications  Scheduled Meds: . aspirin EC  81 mg Oral Daily  . azithromycin  500 mg Intravenous Q24H  . bisoprolol  5 mg Oral Daily  . budesonide (PULMICORT) nebulizer solution  0.25 mg Nebulization BID  . cefTRIAXone (ROCEPHIN)  IV  1 g Intravenous Q24H  . ferrous sulfate  325 mg Oral TID WC  . guaiFENesin  600 mg Oral BID  . heparin  5,000 Units Subcutaneous Q8H  . insulin aspart  0-20 Units Subcutaneous Q4H  . ipratropium-albuterol  3 mL Nebulization TID  . mouth rinse  15 mL Mouth Rinse BID  . pravastatin  40 mg Oral q1800  . predniSONE  50 mg Oral Q breakfast  . sodium chloride flush  3 mL Intravenous Q12H  . vitamin B-12  1,000 mcg Oral Daily   Continuous Infusions: . albuterol 20 mg/hr (01/01/17 1142)   PRN Meds:.acetaminophen **OR**  acetaminophen, guaiFENesin-dextromethorphan, hydrALAZINE, ipratropium-albuterol, ondansetron **OR** ondansetron (ZOFRAN) IV  Micro Results Recent Results (from the past 240 hour(s))  Respiratory Panel by PCR     Status: None   Collection Time: 01/01/17  2:40 PM  Result Value Ref Range Status   Adenovirus NOT DETECTED NOT DETECTED Final   Coronavirus 229E NOT DETECTED NOT DETECTED Final   Coronavirus HKU1 NOT DETECTED NOT DETECTED Final   Coronavirus NL63 NOT DETECTED NOT DETECTED Final   Coronavirus OC43 NOT DETECTED NOT DETECTED Final   Metapneumovirus NOT DETECTED NOT DETECTED Final   Rhinovirus / Enterovirus NOT DETECTED NOT DETECTED Final   Influenza A NOT DETECTED NOT DETECTED Final   Influenza B NOT DETECTED NOT DETECTED Final   Parainfluenza Virus 1 NOT DETECTED NOT DETECTED Final  Parainfluenza Virus 2 NOT DETECTED NOT DETECTED Final   Parainfluenza Virus 3 NOT DETECTED NOT DETECTED Final   Parainfluenza Virus 4 NOT DETECTED NOT DETECTED Final   Respiratory Syncytial Virus NOT DETECTED NOT DETECTED Final   Bordetella pertussis NOT DETECTED NOT DETECTED Final   Chlamydophila pneumoniae NOT DETECTED NOT DETECTED Final   Mycoplasma pneumoniae NOT DETECTED NOT DETECTED Final  MRSA PCR Screening     Status: None   Collection Time: 01/01/17  6:01 PM  Result Value Ref Range Status   MRSA by PCR NEGATIVE NEGATIVE Final    Comment:        The GeneXpert MRSA Assay (FDA approved for NASAL specimens only), is one component of a comprehensive MRSA colonization surveillance program. It is not intended to diagnose MRSA infection nor to guide or monitor treatment for MRSA infections.     Radiology Reports Dg Chest 1 View  Result Date: 01/02/2017 CLINICAL DATA:  Shortness of breath. EXAM: CHEST 1 VIEW COMPARISON:  January 02, 2016 FINDINGS: Stable cardiomediastinal silhouette and cardiomegaly. Bilateral pulmonary infiltrates are identified, similar in the interval. No other  interval changes. IMPRESSION: Cardiomegaly. Diffuse bilateral pulmonary opacities persist, unchanged in the interval. Electronically Signed   By: Gerome Sam III M.D   On: 01/02/2017 07:20   Dg Chest Port 1 View  Result Date: 01/03/2017 CLINICAL DATA:  83 year old female with hemoptysis, congestion, and shortness of breath. EXAM: PORTABLE CHEST 1 VIEW COMPARISON:  Chest radiograph dated 01/02/2017 FINDINGS: There is persistent bilateral airspace opacities. Trace bilateral pleural effusions may be present. There is no pneumothorax. There is stable mild cardiomegaly. No acute osseous pathology. IMPRESSION: Persistent bilateral airspace opacities. Clinical correlation and follow-up recommended. Electronically Signed   By: Elgie Collard M.D.   On: 01/03/2017 01:34   Dg Chest Port 1 View  Result Date: 01/01/2017 CLINICAL DATA:  Respiratory distress. EXAM: PORTABLE CHEST 1 VIEW COMPARISON:  None. FINDINGS: Cardiomegaly. Bilateral pulmonary opacities, right greater than left. No pneumothorax. No other acute abnormalities. IMPRESSION: Diffuse bilateral pulmonary opacities, right greater than left. Opacity is more confluent and patchy in the right base. The findings could represent asymmetric edema but diffuse infection is not completely excluded. Recommend clinical correlation and follow-up. Electronically Signed   By: Gerome Sam III M.D   On: 01/01/2017 10:54   Dg Abd Portable 1v  Result Date: 01/01/2017 CLINICAL DATA:  Shortness of breath and abdominal distention. EXAM: PORTABLE ABDOMEN - 1 VIEW COMPARISON:  None FINDINGS: Mild gaseous distension of the small and large bowel loops. Gas and stool noted throughout the colon up level the rectum. IMPRESSION: Nonobstructive bowel gas pattern. Electronically Signed   By: Signa Kell M.D.   On: 01/01/2017 14:52    Time Spent in minutes  25   Eddie North M.D on 01/05/2017 at 11:24 AM  Between 7am to 7pm - Pager - [phone]  After 7pm go  to [email or web address] - password [facility]  [facility] -  Office  [phone]

## 2017-01-05 NOTE — Progress Notes (Signed)
Progress Note  Patient Name: Kelly [name] Date of Encounter: 01/05/2017  Primary Cardiologist: New. (to establish with [facility] based practice)  Subjective   Complains of back pain today. No dyspnea.   Inpatient Medications    Scheduled Meds: . aspirin EC  81 mg Oral Daily  . azithromycin  500 mg Intravenous Q24H  . bisoprolol  5 mg Oral Daily  . budesonide (PULMICORT) nebulizer solution  0.25 mg Nebulization BID  . cefTRIAXone (ROCEPHIN)  IV  1 g Intravenous Q24H  . ferrous sulfate  325 mg Oral TID WC  . guaiFENesin  600 mg Oral BID  . heparin  5,000 Units Subcutaneous Q8H  . insulin aspart  0-20 Units Subcutaneous Q4H  . ipratropium-albuterol  3 mL Nebulization TID  . mouth rinse  15 mL Mouth Rinse BID  . pravastatin  40 mg Oral q1800  . predniSONE  50 mg Oral Q breakfast  . sodium chloride flush  3 mL Intravenous Q12H  . vitamin B-12  1,000 mcg Oral Daily   Continuous Infusions: . albuterol 20 mg/hr (01/01/17 1142)   PRN Meds: acetaminophen **OR** acetaminophen, guaiFENesin-dextromethorphan, hydrALAZINE, ipratropium-albuterol, ondansetron **OR** ondansetron (ZOFRAN) IV   Vital Signs    Vitals:   01/04/17 2313 01/05/17 0427 01/05/17 0742 01/05/17 0752  BP: (!) 147/65 140/72 128/68   Pulse: 65 (!) 58 (!) 54   Resp: 20 20 20    Temp: 98.1 F (36.7 C) 98.2 F (36.8 C) 98.1 F (36.7 C)   TempSrc: Oral Oral Oral   SpO2: 96% 99% 99% 94%  Weight:  177 lb 6.4 oz (80.5 kg)    Height:        Intake/Output Summary (Last 24 hours) at 01/05/17 0947 Last data filed at 01/05/17 0600  Gross per 24 hour  Intake              480 ml  Output             1300 ml  Net             -820 ml   Filed Weights   01/04/17 0441 01/04/17 1600 01/05/17 0427  Weight: 179 lb 4.8 oz (81.3 kg) 179 lb 11.2 oz (81.5 kg) 177 lb 6.4 oz (80.5 kg)    Telemetry    NSR - Personally Reviewed    Physical Exam   GEN: No acute distress.  Neck: No JVD Cardiac: RRR, no murmurs,  rubs, or gallops.  Respiratory: Clear to auscultation bilaterally. GI: Soft, nontender, non-distended  MS: No edema; No deformity. Neuro:  AAOx3. Psych: Normal affect  Labs    Chemistry Recent Labs Lab 01/01/17 1046  01/03/17 0246 01/04/17 0251 01/05/17 0543  NA 142  < > 142 139 141  K 3.5  < > 3.6 3.2* 4.3  CL 105  < > 105 101 102  CO2 25  < > 24 28 26   GLUCOSE 163*  < > 159* 129* 146*  BUN 23*  < > 45* 63* 69*  CREATININE 1.37*  < > 1.61* 1.75* 1.50*  CALCIUM 9.7  < > 9.3 8.6* 9.0  PROT 8.2*  --   --   --   --   ALBUMIN 3.9  --   --   --   --   AST 44*  --   --   --   --   ALT 57*  --   --   --   --   ALKPHOS 82  --   --   --   --  BILITOT 1.1  --   --   --   --   GFRNONAA 35*  < > 28* 26* 31*  GFRAA 40*  < > 33* 30* 36*  ANIONGAP 12  < > 13 10 13   < > = values in this interval not displayed.   Hematology Recent Labs Lab 01/03/17 0246 01/04/17 0251 01/05/17 0543  WBC 30.3* 21.2* 18.5*  RBC 3.09* 2.79* 2.91*  HGB 9.1* 8.1* 8.4*  HCT 27.7* 24.9* 26.3*  MCV 89.6 89.2 90.4  MCH 29.4 29.0 28.9  MCHC 32.9 32.5 31.9  RDW 16.1* 16.3* 16.3*  PLT 187 169 196    Cardiac Enzymes Recent Labs Lab 01/01/17 1609 01/01/17 2029 01/02/17 0226 01/02/17 1132  TROPONINI 3.26* 3.75* 3.91* 4.91*    Recent Labs Lab 01/01/17 1051  TROPIPOC 0.41*     BNP Recent Labs Lab 01/01/17 1046 01/03/17 0906  BNP 658.7* 458.4*     DDimer No results for input(s): DDIMER in the last 168 hours.   Radiology    No results found.  Cardiac Studies  Echocardiogram 01/03/17:Study Conclusions  - Left ventricle: The cavity size was normal. There was severe concentric hypertrophy. Systolic function was moderately reduced. The estimated ejection fraction was in the range of 35% to 40%. Wall motion was normal; there were no regional wall motion abnormalities. Features are consistent with a pseudonormal left ventricular filling pattern, with concomitant abnormal  relaxation and increased filling pressure (grade 2 diastolic dysfunction). Doppler parameters are consistent with elevated ventricular end-diastolic filling pressure. - Aortic root: The aortic root was normal in size. - Mitral valve: There was mild regurgitation. - Left atrium: The atrium was moderately dilated. - Right ventricle: Systolic function was normal. - Right atrium: The atrium was moderately dilated. - Tricuspid valve: There was moderate regurgitation. - Pulmonic valve: There was mild regurgitation. - Pulmonary arteries: PA peak pressure: 62 mm Hg (S). - Inferior vena cava: The vessel was dilated. The respirophasic diameter changes were blunted (<50%), consistent with elevated central venous pressure. - Pericardium, extracardiac: There was no pericardial effusion.  Impressions:  - Severe concentric left ventricular hypertrophy with mildly decreased LVEF and diffuse hypokinesis. A cardiac MRI is recommended to evaluate for possible infiltrative cardiomyopathy.   Patient Profile     83 y.o. female with history of COPD, apparent old left bundle branch block, and reported history of "3 heart attacks" in the past. States that she followed with a physician in [place] for prior cardiac events, does not indicate any prior cardiac catheterization or PCI. She presents with worsening shortness of breath over the last week, with coughing. Chest x-ray is concerning for possible associated pneumonia versus asymmetric edema. She is on broad-spectrum antibiotics with additional pulmonary treatment per primary team. Cardiac enzymes suggest NSTEMI. Diuresed with lasix.  Assessment & Plan    1. NSTEMI versus demand ischemia, Troponin I up to 4.9. No active chest pain. ECG shows left bundle branch block which is reportedly old. No old tracing for comparison. LV systolic function  mildly reduced estimate EF of 45% with some septal hypokinesis ? Due to LBBB. There is marked  concentric LVH. Biatrial enlargement. Moderate-severe pulmonary HTN. IV heparin discontinued due to hemoptysis.   Will continue ASA, statin, add selective beta blocker- bisoprolol. Not a candidate for ACEi/ARB due to acute on CKD.  For right and left heart cath tomorrow, 01/06/17.   2. COPD:  with  exacerbation and possible associated community acquired pneumonia. Presented with hypoxic/hypercapnic respiratory failure, transiently  on BiPAP- now on [facility]. Management per primary team.   3. Acute combined diastolic/systolic CHF: Marked LVH on Echo raises possibility of infiltrative disease. May consider MRI at some point. She has an excellent response to diuretics with I/O negative 5.1 liters and weight down 7 lbs.   No ACE-I/ARB in the setting of renal insufficiency.   4. Hypokalemia,  repleted.  5. Type 2 diabetes mellitus, on Januvia as an outpatient.  6. Hyperlipidemia, on Pravachol as an outpatient.  7. Hypertension with hypertensive heart disease- marked LVH, on Cozaar as an outpatient on hold for now due to ARF.  8. SVT- brief run- monitor on bisoprolol. No recurrence   Signed, Little Ishikawa, NP  01/05/2017, 9:47 AM   Patient seen and examined and history reviewed. Agree with above findings and plan. Breathing is improved. Weight is down another 2 lbs. Lasix still on hold. Renal parameters are better. Still requiring oxygen. I think she is stable to proceed with right and left heart cath tomorrow. The procedure and risks were reviewed including but not limited to death, myocardial infarction, stroke, arrythmias, bleeding, transfusion, emergency surgery, dye allergy, or renal dysfunction. The patient voices understanding and is agreeable to proceed.    Peter [place], MDFACC 01/05/2017 10:16 AM

## 2017-01-05 NOTE — Care Management Important Message (Signed)
Important Message  Patient Details  Name: Kelly [name] MRN: [id number] Date of Birth: 09-18-1931   Medicare Important Message Given:  Yes    Adie Vilar Stefan Church 01/05/2017, 12:05 PM

## 2017-01-06 ENCOUNTER — Encounter (HOSPITAL_COMMUNITY)
Admission: EM | Disposition: A | Discharge status: Skilled Nursing Facility | Payer: Self-pay | Source: Home / Self Care | Attending: Internal Medicine

## 2017-01-06 ENCOUNTER — Encounter (HOSPITAL_COMMUNITY): Payer: Self-pay | Admitting: Cardiology

## 2017-01-06 DIAGNOSIS — I471 Supraventricular tachycardia: Secondary | ICD-10-CM

## 2017-01-06 DIAGNOSIS — I420 Dilated cardiomyopathy: Secondary | ICD-10-CM

## 2017-01-06 HISTORY — PX: CARDIAC CATHETERIZATION: SHX172

## 2017-01-06 LAB — CBC
HCT: 26.7 % — ABNORMAL LOW (ref 36.0–46.0)
HEMATOCRIT: 28.7 % — AB (ref 36.0–46.0)
Hemoglobin: 8.5 g/dL — ABNORMAL LOW (ref 12.0–15.0)
Hemoglobin: 9.1 g/dL — ABNORMAL LOW (ref 12.0–15.0)
MCH: 29 pg (ref 26.0–34.0)
MCH: 29 pg (ref 26.0–34.0)
MCHC: 31.8 g/dL (ref 30.0–36.0)
MCHC: 31.7 g/dL (ref 30.0–36.0)
MCV: 91.1 fL (ref 78.0–100.0)
MCV: 91.4 fL (ref 78.0–100.0)
PLATELETS: 221 10*3/uL (ref 150–400)
PLATELETS: 219 10*3/uL (ref 150–400)
RBC: 2.93 MIL/uL — ABNORMAL LOW (ref 3.87–5.11)
RBC: 3.14 MIL/uL — ABNORMAL LOW (ref 3.87–5.11)
RDW: 16 % — AB (ref 11.5–15.5)
RDW: 16.1 % — AB (ref 11.5–15.5)
WBC: 15.7 10*3/uL — AB (ref 4.0–10.5)
WBC: 15.7 10*3/uL — ABNORMAL HIGH (ref 4.0–10.5)

## 2017-01-06 LAB — BASIC METABOLIC PANEL
Anion gap: 10 (ref 5–15)
BUN: 65 mg/dL — AB (ref 6–20)
CHLORIDE: 104 mmol/L (ref 101–111)
CO2: 27 mmol/L (ref 22–32)
CREATININE: 1.31 mg/dL — AB (ref 0.44–1.00)
Calcium: 8.9 mg/dL (ref 8.9–10.3)
GFR calc Af Amer: 42 mL/min — ABNORMAL LOW (ref >60)
GFR calc non Af Amer: 37 mL/min — ABNORMAL LOW (ref >60)
Glucose, Bld: 156 mg/dL — ABNORMAL HIGH (ref 65–99)
Potassium: 4.1 mmol/L (ref 3.5–5.1)
SODIUM: 141 mmol/L (ref 135–145)

## 2017-01-06 LAB — CREATININE, SERUM
Creatinine, Ser: 1.28 mg/dL — ABNORMAL HIGH (ref 0.44–1.00)
GFR calc Af Amer: 44 mL/min — ABNORMAL LOW (ref >60)
GFR, EST NON AFRICAN AMERICAN: 38 mL/min — AB (ref >60)

## 2017-01-06 LAB — GLUCOSE, CAPILLARY
GLUCOSE-CAPILLARY: 118 mg/dL — AB (ref 65–99)
GLUCOSE-CAPILLARY: 158 mg/dL — AB (ref 65–99)
GLUCOSE-CAPILLARY: 175 mg/dL — AB (ref 65–99)
Glucose-Capillary: 161 mg/dL — ABNORMAL HIGH (ref 65–99)

## 2017-01-06 LAB — POCT I-STAT 3, VENOUS BLOOD GAS (G3P V)
ACID-BASE EXCESS: 4 mmol/L — AB (ref 0.0–2.0)
Acid-Base Excess: 4 mmol/L — ABNORMAL HIGH (ref 0.0–2.0)
BICARBONATE: 29 mmol/L — AB (ref 20.0–28.0)
Bicarbonate: 28.8 mmol/L — ABNORMAL HIGH (ref 20.0–28.0)
O2 SAT: 38 %
O2 Saturation: 38 %
PCO2 VEN: 42.4 mmHg — AB (ref 44.0–60.0)
PH VEN: 7.431 — AB (ref 7.250–7.430)
TCO2: 30 mmol/L (ref 0–100)
TCO2: 30 mmol/L (ref 0–100)
pCO2, Ven: 43.5 mmHg — ABNORMAL LOW (ref 44.0–60.0)
pH, Ven: 7.44 — ABNORMAL HIGH (ref 7.250–7.430)
pO2, Ven: 22 mmHg — CL (ref 32.0–45.0)
pO2, Ven: 22 mmHg — CL (ref 32.0–45.0)

## 2017-01-06 LAB — POCT I-STAT 3, ART BLOOD GAS (G3+)
ACID-BASE EXCESS: 3 mmol/L — AB (ref 0.0–2.0)
Bicarbonate: 26.4 mmol/L (ref 20.0–28.0)
O2 SAT: 95 %
PO2 ART: 68 mmHg — AB (ref 83.0–108.0)
TCO2: 27 mmol/L (ref 0–100)
pCO2 arterial: 34.8 mmHg (ref 32.0–48.0)
pH, Arterial: 7.488 — ABNORMAL HIGH (ref 7.350–7.450)

## 2017-01-06 LAB — PROTIME-INR
INR: 1.36
PROTHROMBIN TIME: 16.9 s — AB (ref 11.4–15.2)

## 2017-01-06 SURGERY — RIGHT/LEFT HEART CATH AND CORONARY ANGIOGRAPHY

## 2017-01-06 MED ORDER — HEPARIN SODIUM (PORCINE) 1000 UNIT/ML IJ SOLN
INTRAMUSCULAR | Status: AC
  Filled 2017-01-06: qty 1

## 2017-01-06 MED ORDER — VERAPAMIL HCL 2.5 MG/ML IV SOLN
INTRAVENOUS | Status: DC | PRN
  Administered 2017-01-06: 10 mL via INTRA_ARTERIAL

## 2017-01-06 MED ORDER — IOPAMIDOL (ISOVUE-370) INJECTION 76%
INTRAVENOUS | Status: DC | PRN
Start: 2017-01-06 — End: 2017-01-06
  Administered 2017-01-06: 40 mL via INTRA_ARTERIAL

## 2017-01-06 MED ORDER — SODIUM CHLORIDE 0.9 % IV SOLN: 250.0000 mL | INTRAVENOUS | Status: DC | PRN

## 2017-01-06 MED ORDER — LIDOCAINE HCL (PF) 1 % IJ SOLN
INTRAMUSCULAR | Status: AC
  Filled 2017-01-06: qty 30

## 2017-01-06 MED ORDER — FUROSEMIDE 40 MG PO TABS
40.0000 mg | ORAL_TABLET | Freq: Every day | ORAL | Status: DC
  Administered 2017-01-06 – 2017-01-08 (×3): 40 mg via ORAL
  Filled 2017-01-06 (×3): qty 1

## 2017-01-06 MED ORDER — HEPARIN SODIUM (PORCINE) 1000 UNIT/ML IJ SOLN
INTRAMUSCULAR | Status: DC | PRN
  Administered 2017-01-06: 4000 [IU] via INTRAVENOUS

## 2017-01-06 MED ORDER — INSULIN ASPART 100 UNIT/ML ~~LOC~~ SOLN
0.0000 [IU] | Freq: Three times a day (TID) | SUBCUTANEOUS | Status: DC
Start: 2017-01-07 — End: 2017-01-08
  Administered 2017-01-07: 7 [IU] via SUBCUTANEOUS
  Administered 2017-01-07 – 2017-01-08 (×2): 3 [IU] via SUBCUTANEOUS

## 2017-01-06 MED ORDER — SODIUM CHLORIDE 0.9% FLUSH: 3.0000 mL | INTRAVENOUS | Status: DC | PRN

## 2017-01-06 MED ORDER — SODIUM CHLORIDE 0.9 % IV SOLN
INTRAVENOUS | Status: AC
  Administered 2017-01-06: 13:00:00 via INTRAVENOUS

## 2017-01-06 MED ORDER — IOPAMIDOL (ISOVUE-370) INJECTION 76%
INTRAVENOUS | Status: AC
  Filled 2017-01-06: qty 100

## 2017-01-06 MED ORDER — HEPARIN (PORCINE) IN NACL 2-0.9 UNIT/ML-% IJ SOLN
INTRAMUSCULAR | Status: DC | PRN
  Administered 2017-01-06: 1000 mL

## 2017-01-06 MED ORDER — HEPARIN (PORCINE) IN NACL 2-0.9 UNIT/ML-% IJ SOLN
INTRAMUSCULAR | Status: AC
  Filled 2017-01-06: qty 1000

## 2017-01-06 MED ORDER — VERAPAMIL HCL 2.5 MG/ML IV SOLN
INTRAVENOUS | Status: AC
  Filled 2017-01-06: qty 2

## 2017-01-06 MED ORDER — SODIUM CHLORIDE 0.9% FLUSH
3.0000 mL | Freq: Two times a day (BID) | INTRAVENOUS | Status: DC
  Administered 2017-01-06 – 2017-01-08 (×3): 3 mL via INTRAVENOUS

## 2017-01-06 MED ORDER — LIDOCAINE HCL (PF) 1 % IJ SOLN
INTRAMUSCULAR | Status: DC | PRN
Start: 2017-01-06 — End: 2017-01-06
  Administered 2017-01-06: 6 mL

## 2017-01-06 SURGICAL SUPPLY — 12 items
CATH BALLN WEDGE 5F 110CM (CATHETERS) ×3 IMPLANT
CATH OPTITORQUE TIG 4.0 5F (CATHETERS) ×3 IMPLANT
DEVICE RAD COMP TR BAND LRG (VASCULAR PRODUCTS) ×3 IMPLANT
GLIDESHEATH SLEND A-KIT 6F 22G (SHEATH) ×3 IMPLANT
GUIDEWIRE .025 260CM (WIRE) ×3 IMPLANT
GUIDEWIRE INQWIRE 1.5J.035X260 (WIRE) ×1 IMPLANT
INQWIRE 1.5J .035X260CM (WIRE) ×3
KIT HEART LEFT (KITS) ×3 IMPLANT
PACK CARDIAC CATHETERIZATION (CUSTOM PROCEDURE TRAY) ×3 IMPLANT
SHEATH FAST CATH BRACH 5F 5CM (SHEATH) ×3 IMPLANT
TRANSDUCER W/STOPCOCK (MISCELLANEOUS) ×3 IMPLANT
TUBING CIL FLEX 10 FLL-RA (TUBING) ×3 IMPLANT

## 2017-01-06 NOTE — Progress Notes (Signed)
PROGRESS NOTE                                                                                                                                                                                                             Patient Demographics:    Kelly [name], is a 83 y.o. female, DOB - 02-23-1931, ZOX:[id number]  Admit date - 01/01/2017   Admitting Physician Haydee Salter, MD  Outpatient Primary MD for the patient is No primary care provider on file.  LOS - 4  Outpatient Specialists:None  No chief complaint on file.      Brief Narrative   Patient is a 83 y.o. female with h/o COPD, CKD, LBBB, and HTN. She lives in [place], but was visiting her son in [place] when saturday she came to the ER due to SOB. EKG revealed NSTEMI and she was admitted for further workup. Hospital course prolonged due to acute pulmonary edema and worsening of renal function.   Subjective:   Feels fine. Weight slightly up.   Assessment  & Plan :   Acute respiratory failure:  COPD exacerbation and acute pulmonary edema. Improved with IV Lasix.  empiric antibiotics.  oral prednisone and When necessary nebs. Wean oxygen as tolerated.   Bilateral Pleural Effusions:  Secondary to acute pulmonary edema. Responded to IV Lasix. Now much improved and diuresed well (5.5 L due to since admission) Echo shows EF 35-40% with severe LVH, recommends cardiac MRI ( cannot obtain due to renal fn)   COPD with acute exacerbation (HCC):  As outlined above. Continue oral prednisone, with oxygen as tolerated. When necessary nebs and antitussives.  NSTEMI (non-ST elevated myocardial infarction):   Echo showed EF 35-40% with grade 2 diastolic dysfunction. Possibly ischemic cardiomyopathy. Elevated PA pressure was (62 mmhg), troponin peaked at 4.91.  Continue ASA, statin, bisoprolol. IV heparin discontinued due to hemoptysis. Cardiac cath scheduled  today.  AKI on CKD (chronic kidney disease):  Possibly secondary to urinary retention.syuyspect underlying CKD stage 3. Improved. Seems at baseline.  Ischemic Cardiomyopathy:  Asymptomatic at present. Continue to hold ARB due to AKA. Off Lasix.   Acute urinary retention:  Required foley during hospitalization. Has good urine output. Plan voiding trial prior to discharge.  Iron deficiency/B12 deficiency anemia. Heparin drip discontinued due to hemoptysis. H&H stable On iron and B12 supplement which has been  resumed.  Hypertensive heart disease:  Continue with bisoprolol, statin, and hydralazine.  LBBB (left bundle branch block):  Pre-existing as stated by patient. Continue bisoprolol. Continued monitoring with telemetry.        Type 2  diabetes mellitus cbg stable.. Hgb A1C on 01/01/17 was 5.5. Continue novoLOG sliding scale.     Code Status : Full Code  Family Communication  : son at bedside  Disposition Plan  : Pending cardiac workup (cardiac cath)  Barriers For Discharge : improving symptoms  Consults  :  Cardiology  Procedures  :  2-D echo cardiac cath  DVT Prophylaxis  : Heparin  Lab Results  Component Value Date   PLT 221 01/06/2017    Antibiotics  :    Anti-infectives    Start     Dose/Rate Route Frequency Ordered Stop   01/02/17 1400  [MAR Hold]  cefTRIAXone (ROCEPHIN) 1 g in dextrose 5 % 50 mL IVPB     (MAR Hold since 01/06/17 1039)   1 g 100 mL/hr over 30 Minutes Intravenous Every 24 hours 01/01/17 1943 01/08/17 1359   01/02/17 1400  [MAR Hold]  azithromycin (ZITHROMAX) 500 mg in dextrose 5 % 250 mL IVPB     (MAR Hold since 01/06/17 1039)   500 mg 250 mL/hr over 60 Minutes Intravenous Every 24 hours 01/01/17 1943     01/01/17 1230  cefTRIAXone (ROCEPHIN) 1 g in dextrose 5 % 50 mL IVPB     1 g 100 mL/hr over 30 Minutes Intravenous  Once 01/01/17 1222 01/01/17 1433   01/01/17 1230  azithromycin (ZITHROMAX) 500 mg in dextrose 5 % 250 mL IVPB      500 mg 250 mL/hr over 60 Minutes Intravenous  Once 01/01/17 1222 01/01/17 1346        Objective:   Vitals:   01/05/17 2233 01/06/17 0034 01/06/17 0438 01/06/17 0816  BP: 123/72 (!) 144/69 (!) 156/72   Pulse: 60 60 (!) 57   Resp: 18 19 19    Temp: 97.7 F (36.5 C) 98.2 F (36.8 C) 98 F (36.7 C)   TempSrc: Axillary Oral Oral   SpO2: 97% 97% 99% 98%  Weight:   83.1 kg (183 lb 3.2 oz)   Height:        Wt Readings from Last 3 Encounters:  01/06/17 83.1 kg (183 lb 3.2 oz)     Intake/Output Summary (Last 24 hours) at 01/06/17 1054 Last data filed at 01/06/17 0519  Gross per 24 hour  Intake              850 ml  Output             1150 ml  Net             -300 ml     Physical Exam  Gen: not in distress HEENT:  moist mucosa, supple neck Chest: clear b/l, no added sounds CVS: N S1&S2, no murmurs GI: soft, NT, ND,  Musculoskeletal: warm, no edema     Data Review:    CBC  Recent Labs Lab 01/01/17 1046 01/02/17 0226 01/03/17 0246 01/04/17 0251 01/05/17 0543 01/06/17 0356  WBC 20.8* 14.1* 30.3* 21.2* 18.5* 15.7*  HGB 10.4* 8.4* 9.1* 8.1* 8.4* 8.5*  HCT 33.6* 25.6* 27.7* 24.9* 26.3* 26.7*  PLT 232 151 187 169 196 221  MCV 94.1 89.8 89.6 89.2 90.4 91.1  MCH 29.1 29.5 29.4 29.0 28.9 29.0  MCHC 31.0 32.8 32.9 32.5 31.9 31.8  RDW 16.1*  15.9* 16.1* 16.3* 16.3* 16.0*  LYMPHSABS 6.0*  --   --   --   --   --   MONOABS 0.6  --   --   --   --   --   EOSABS 4.6*  --   --   --   --   --   BASOSABS 0.1  --   --   --   --   --     Chemistries   Recent Labs Lab 01/01/17 1046 01/02/17 0226 01/03/17 0246 01/04/17 0251 01/05/17 0543 01/06/17 0356  NA 142 142 142 139 141 141  K 3.5 2.9* 3.6 3.2* 4.3 4.1  CL 105 104 105 101 102 104  CO2 25 27 24 28 26 27   GLUCOSE 163* 152* 159* 129* 146* 156*  BUN 23* 30* 45* 63* 69* 65*  CREATININE 1.37* 1.39* 1.61* 1.75* 1.50* 1.31*  CALCIUM 9.7 8.9 9.3 8.6* 9.0 8.9  MG  --  2.1  --   --   --   --   AST 44*  --   --   --    --   --   ALT 57*  --   --   --   --   --   ALKPHOS 82  --   --   --   --   --   BILITOT 1.1  --   --   --   --   --    ------------------------------------------------------------------------------------------------------------------ No results for input(s): CHOL, HDL, LDLCALC, TRIG, CHOLHDL, LDLDIRECT in the last 72 hours.  Lab Results  Component Value Date   HGBA1C 5.5 01/01/2017   ------------------------------------------------------------------------------------------------------------------ No results for input(s): TSH, T4TOTAL, T3FREE, THYROIDAB in the last 72 hours.  Invalid input(s): FREET3 ------------------------------------------------------------------------------------------------------------------ No results for input(s): VITAMINB12, FOLATE, FERRITIN, TIBC, IRON, RETICCTPCT in the last 72 hours.  Coagulation profile  Recent Labs Lab 01/06/17 0356  INR 1.36    No results for input(s): DDIMER in the last 72 hours.  Cardiac Enzymes  Recent Labs Lab 01/01/17 2029 01/02/17 0226 01/02/17 1132  TROPONINI 3.75* 3.91* 4.91*   ------------------------------------------------------------------------------------------------------------------    Component Value Date/Time   BNP 458.4 (H) 01/03/2017 0906    Inpatient Medications  Scheduled Meds: . [MAR Hold] aspirin EC  81 mg Oral Daily  . [MAR Hold] azithromycin  500 mg Intravenous Q24H  . [MAR Hold] bisoprolol  5 mg Oral Daily  . [MAR Hold] budesonide (PULMICORT) nebulizer solution  0.25 mg Nebulization BID  . [MAR Hold] cefTRIAXone (ROCEPHIN)  IV  1 g Intravenous Q24H  . [MAR Hold] ferrous sulfate  325 mg Oral TID WC  . [MAR Hold] guaiFENesin  600 mg Oral BID  . [MAR Hold] heparin  5,000 Units Subcutaneous Q8H  . [MAR Hold] insulin aspart  0-20 Units Subcutaneous Q4H  . [MAR Hold] ipratropium-albuterol  3 mL Nebulization TID  . [MAR Hold] mouth rinse  15 mL Mouth Rinse BID  . [MAR Hold] pravastatin  40  mg Oral q1800  . [MAR Hold] predniSONE  50 mg Oral Q breakfast  . [MAR Hold] sodium chloride flush  3 mL Intravenous Q12H  . [MAR Hold] vitamin B-12  1,000 mcg Oral Daily   Continuous Infusions: . sodium chloride 50 mL/hr at 01/06/17 0501  . albuterol 20 mg/hr (01/01/17 1142)   PRN Meds:.[MAR Hold] acetaminophen **OR** [MAR Hold] acetaminophen, [MAR Hold] guaiFENesin-dextromethorphan, [MAR Hold] hydrALAZINE, [MAR Hold] ipratropium-albuterol, [MAR Hold] ondansetron **OR** [MAR Hold] ondansetron (ZOFRAN) IV  Micro Results Recent Results (from the past 240 hour(s))  Respiratory Panel by PCR     Status: None   Collection Time: 01/01/17  2:40 PM  Result Value Ref Range Status   Adenovirus NOT DETECTED NOT DETECTED Final   Coronavirus 229E NOT DETECTED NOT DETECTED Final   Coronavirus HKU1 NOT DETECTED NOT DETECTED Final   Coronavirus NL63 NOT DETECTED NOT DETECTED Final   Coronavirus OC43 NOT DETECTED NOT DETECTED Final   Metapneumovirus NOT DETECTED NOT DETECTED Final   Rhinovirus / Enterovirus NOT DETECTED NOT DETECTED Final   Influenza A NOT DETECTED NOT DETECTED Final   Influenza B NOT DETECTED NOT DETECTED Final   Parainfluenza Virus 1 NOT DETECTED NOT DETECTED Final   Parainfluenza Virus 2 NOT DETECTED NOT DETECTED Final   Parainfluenza Virus 3 NOT DETECTED NOT DETECTED Final   Parainfluenza Virus 4 NOT DETECTED NOT DETECTED Final   Respiratory Syncytial Virus NOT DETECTED NOT DETECTED Final   Bordetella pertussis NOT DETECTED NOT DETECTED Final   Chlamydophila pneumoniae NOT DETECTED NOT DETECTED Final   Mycoplasma pneumoniae NOT DETECTED NOT DETECTED Final  MRSA PCR Screening     Status: None   Collection Time: 01/01/17  6:01 PM  Result Value Ref Range Status   MRSA by PCR NEGATIVE NEGATIVE Final    Comment:        The GeneXpert MRSA Assay (FDA approved for NASAL specimens only), is one component of a comprehensive MRSA colonization surveillance program. It is  not intended to diagnose MRSA infection nor to guide or monitor treatment for MRSA infections.     Radiology Reports Dg Chest 1 View  Result Date: 01/02/2017 CLINICAL DATA:  Shortness of breath. EXAM: CHEST 1 VIEW COMPARISON:  January 02, 2016 FINDINGS: Stable cardiomediastinal silhouette and cardiomegaly. Bilateral pulmonary infiltrates are identified, similar in the interval. No other interval changes. IMPRESSION: Cardiomegaly. Diffuse bilateral pulmonary opacities persist, unchanged in the interval. Electronically Signed   By: Gerome Sam III M.D   On: 01/02/2017 07:20   Dg Chest Port 1 View  Result Date: 01/03/2017 CLINICAL DATA:  83 year old female with hemoptysis, congestion, and shortness of breath. EXAM: PORTABLE CHEST 1 VIEW COMPARISON:  Chest radiograph dated 01/02/2017 FINDINGS: There is persistent bilateral airspace opacities. Trace bilateral pleural effusions may be present. There is no pneumothorax. There is stable mild cardiomegaly. No acute osseous pathology. IMPRESSION: Persistent bilateral airspace opacities. Clinical correlation and follow-up recommended. Electronically Signed   By: Elgie Collard M.D.   On: 01/03/2017 01:34   Dg Chest Port 1 View  Result Date: 01/01/2017 CLINICAL DATA:  Respiratory distress. EXAM: PORTABLE CHEST 1 VIEW COMPARISON:  None. FINDINGS: Cardiomegaly. Bilateral pulmonary opacities, right greater than left. No pneumothorax. No other acute abnormalities. IMPRESSION: Diffuse bilateral pulmonary opacities, right greater than left. Opacity is more confluent and patchy in the right base. The findings could represent asymmetric edema but diffuse infection is not completely excluded. Recommend clinical correlation and follow-up. Electronically Signed   By: Gerome Sam III M.D   On: 01/01/2017 10:54   Dg Abd Portable 1v  Result Date: 01/01/2017 CLINICAL DATA:  Shortness of breath and abdominal distention. EXAM: PORTABLE ABDOMEN - 1 VIEW  COMPARISON:  None FINDINGS: Mild gaseous distension of the small and large bowel loops. Gas and stool noted throughout the colon up level the rectum. IMPRESSION: Nonobstructive bowel gas pattern. Electronically Signed   By: Signa Kell M.D.   On: 01/01/2017 14:52    Time Spent in minutes  25   Eddie North M.D on 01/06/2017 at 10:54 AM  Between 7am to 7pm - Pager - [phone]  After 7pm go to [email or web address] - password [facility]  [facility] -  Office  [phone]

## 2017-01-06 NOTE — Progress Notes (Signed)
Physical Therapy Treatment Patient Details Name: Romelle Ille MRN: [id number] DOB: 07-31-1931 Today's Date: Feb 03, 2017    History of Present Illness Pt adm with SOB and found to have NSTEMI versus demand ischemia. Also acute on chronic heart failure. PMH - HTN, DM, MI    PT Comments    Pt making steady progress. Activity tolerance remains limited.  Follow Up Recommendations  SNF (may progress rapidly and be able to return home)     Equipment Recommendations  Rolling walker with 5" wheels    Recommendations for Other Services       Precautions / Restrictions Precautions Precautions: Fall Restrictions Weight Bearing Restrictions: No    Mobility  Bed Mobility Overal bed mobility: Needs Assistance Bed Mobility: Supine to Sit;Sit to Supine     Supine to sit: Supervision Sit to supine: Supervision   General bed mobility comments: Incr time  Transfers Overall transfer level: Needs assistance Equipment used: Rolling walker (2 wheeled) Transfers: Sit to/from Stand Sit to Stand: Min assist         General transfer comment: Assist to bring hips up and for balance. Verbal cues for hand placement.  Ambulation/Gait Ambulation/Gait assistance: Min assist Ambulation Distance (Feet): 30 Feet Assistive device: Rolling walker (2 wheeled) Gait Pattern/deviations: Step-through pattern;Decreased step length - right;Decreased step length - left;Trunk flexed Gait velocity: decr Gait velocity interpretation: Below normal speed for age/gender General Gait Details: Assist for balance and support. Amb on 2L O2 with SpO2 96% after amb. Fatigues quickly.   Stairs            Wheelchair Mobility    Modified Rankin (Stroke Patients Only)       Balance Overall balance assessment: Needs assistance Sitting-balance support: No upper extremity supported;Feet supported Sitting balance-Leahy Scale: Good     Standing balance support: Bilateral upper extremity  supported Standing balance-Leahy Scale: Poor Standing balance comment: walker and supervision for static standing                    Cognition Arousal/Alertness: Awake/alert Behavior During Therapy: WFL for tasks assessed/performed Overall Cognitive Status: Within Functional Limits for tasks assessed                      Exercises      General Comments        Pertinent Vitals/Pain Pain Assessment: No/denies pain    Home Living                      Prior Function            PT Goals (current goals can now be found in the care plan section) Progress towards PT goals: Progressing toward goals    Frequency    Min 3X/week      PT Plan Current plan remains appropriate    Co-evaluation             End of Session Equipment Utilized During Treatment: Gait belt;Oxygen Activity Tolerance: Patient limited by fatigue Patient left: in bed;with bed alarm set;with call bell/phone within reach;with family/visitor present     Time: 0109-3235 PT Time Calculation (min) (ACUTE ONLY): 23 min  Charges:  $Gait Training: 8-22 mins                    G Codes:      Angelina Ok [place] 2017/02/03, 9:18 AM Skip Mayer PT [phone]

## 2017-01-06 NOTE — H&P (View-Only) (Signed)
Progress Note  Patient Name: Kelly [name] Date of Encounter: 01/06/2017  Primary Cardiologist: New. Dr. [place]az[place]   Subjective   Feels well, denies chest pain. Denies orthopnea.   Inpatient Medications    Scheduled Meds: . aspirin EC  81 mg Oral Daily  . azithromycin  500 mg Intravenous Q24H  . bisoprolol  5 mg Oral Daily  . budesonide (PULMICORT) nebulizer solution  0.25 mg Nebulization BID  . cefTRIAXone (ROCEPHIN)  IV  1 g Intravenous Q24H  . ferrous sulfate  325 mg Oral TID WC  . guaiFENesin  600 mg Oral BID  . heparin  5,000 Units Subcutaneous Q8H  . insulin aspart  0-20 Units Subcutaneous Q4H  . ipratropium-albuterol  3 mL Nebulization TID  . mouth rinse  15 mL Mouth Rinse BID  . pravastatin  40 mg Oral q1800  . predniSONE  50 mg Oral Q breakfast  . sodium chloride flush  3 mL Intravenous Q12H  . vitamin B-12  1,000 mcg Oral Daily   Continuous Infusions: . sodium chloride 50 mL/hr at 01/06/17 0501  . albuterol 20 mg/hr (01/01/17 1142)   PRN Meds: acetaminophen **OR** acetaminophen, guaiFENesin-dextromethorphan, hydrALAZINE, ipratropium-albuterol, ondansetron **OR** ondansetron (ZOFRAN) IV   Vital Signs    Vitals:   01/05/17 2116 01/05/17 2233 01/06/17 0034 01/06/17 0438  BP:  123/72 (!) 144/69 (!) 156/72  Pulse:  60 60 (!) 57  Resp:  18 19 19   Temp:  97.7 F (36.5 C) 98.2 F (36.8 C) 98 F (36.7 C)  TempSrc:  Axillary Oral Oral  SpO2: 98% 97% 97% 99%  Weight:    183 lb 3.2 oz (83.1 kg)  Height:        Intake/Output Summary (Last 24 hours) at 01/06/17 0756 Last data filed at 01/06/17 0519  Gross per 24 hour  Intake             1090 ml  Output             1470 ml  Net             -380 ml   Filed Weights   01/05/17 0427 01/05/17 1234 01/06/17 0438  Weight: 177 lb 6.4 oz (80.5 kg) 177 lb 7.5 oz (80.5 kg) 183 lb 3.2 oz (83.1 kg)    Telemetry    NSR - Personally Reviewed   Physical Exam   GEN: No acute distress.  Neck: No JVD Cardiac:  RRR, no murmurs, rubs, or gallops.  Respiratory: Clear to auscultation bilaterally. GI: Soft, nontender, non-distended  MS: No edema; No deformity. Neuro:  AAOx3. Psych: Normal affect  Labs    Chemistry Recent Labs Lab 01/01/17 1046  01/04/17 0251 01/05/17 0543 01/06/17 0356  NA 142  < > 139 141 141  K 3.5  < > 3.2* 4.3 4.1  CL 105  < > 101 102 104  CO2 25  < > 28 26 27   GLUCOSE 163*  < > 129* 146* 156*  BUN 23*  < > 63* 69* 65*  CREATININE 1.37*  < > 1.75* 1.50* 1.31*  CALCIUM 9.7  < > 8.6* 9.0 8.9  PROT 8.2*  --   --   --   --   ALBUMIN 3.9  --   --   --   --   AST 44*  --   --   --   --   ALT 57*  --   --   --   --   ALKPHOS  82  --   --   --   --   BILITOT 1.1  --   --   --   --   GFRNONAA 35*  < > 26* 31* 37*  GFRAA 40*  < > 30* 36* 42*  ANIONGAP 12  < > 10 13 10   < > = values in this interval not displayed.   Hematology Recent Labs Lab 01/04/17 0251 01/05/17 0543 01/06/17 0356  WBC 21.2* 18.5* 15.7*  RBC 2.79* 2.91* 2.93*  HGB 8.1* 8.4* 8.5*  HCT 24.9* 26.3* 26.7*  MCV 89.2 90.4 91.1  MCH 29.0 28.9 29.0  MCHC 32.5 31.9 31.8  RDW 16.3* 16.3* 16.0*  PLT 169 196 221    Cardiac Enzymes Recent Labs Lab 01/01/17 1609 01/01/17 2029 01/02/17 0226 01/02/17 1132  TROPONINI 3.26* 3.75* 3.91* 4.91*    Recent Labs Lab 01/01/17 1051  TROPIPOC 0.41*     BNP Recent Labs Lab 01/01/17 1046 01/03/17 0906  BNP 658.7* 458.4*     DDimer No results for input(s): DDIMER in the last 168 hours.   Radiology    No results found.  Cardiac Studies   Echocardiogram 01/03/17:Study Conclusions  - Left ventricle: The cavity size was normal. There was severe concentric hypertrophy. Systolic function was moderately reduced. The estimated ejection fraction was in the range of 35% to 40%. Wall motion was normal; there were no regional wall motion abnormalities. Features are consistent with a pseudonormal left ventricular filling pattern, with  concomitant abnormal relaxation and increased filling pressure (grade 2 diastolic dysfunction). Doppler parameters are consistent with elevated ventricular end-diastolic filling pressure. - Aortic root: The aortic root was normal in size. - Mitral valve: There was mild regurgitation. - Left atrium: The atrium was moderately dilated. - Right ventricle: Systolic function was normal. - Right atrium: The atrium was moderately dilated. - Tricuspid valve: There was moderate regurgitation. - Pulmonic valve: There was mild regurgitation. - Pulmonary arteries: PA peak pressure: 62 mm Hg (S). - Inferior vena cava: The vessel was dilated. The respirophasic diameter changes were blunted (<50%), consistent with elevated central venous pressure. - Pericardium, extracardiac: There was no pericardial effusion.  Impressions:  - Severe concentric left ventricular hypertrophy with mildly decreased LVEF and diffuse hypokinesis. A cardiac MRI is recommended to evaluate for possible infiltrative cardiomyopathy.    Patient Profile     83 y.o. female with history of COPD, apparent old left bundle branch block, and reported history of "3 heart attacks" in the past. States that she followed with a physician in [place] for prior cardiac events, does not indicate any prior cardiac catheterization or PCI. She presents with worsening shortness of breath over the last week, with coughing. Chest x-ray is concerning for possible associated pneumonia versus asymmetric edema. She is on broad-spectrum antibiotics with additional pulmonary treatment per primary team. Cardiac enzymes suggest NSTEMI.    Assessment & Plan    1. NSTEMI : Troponin I up to 4.9. No active chest pain. ECG shows left bundle branch block which is reportedly old. No old tracing for comparison.LV systolic function  mildly reduced estimate EF of 45% with some septal hypokinesis ? Due to LBBB. There is marked concentric LVH.  Biatrial enlargement. Moderate-severepulmonary HTN. IV heparin discontinued due to hemoptysis.   Will continue ASA, statin, add selective beta blocker- bisoprolol. Not a candidate for ACEi/ARB due to acute on CKD.  For right and left heart cath today, 01/06/17. Creatinine improved at 1.31.   2. COPD:  with exacerbation and possible associated community acquired pneumonia. Presented with hypoxic/hypercapnic respiratory failure, transiently on BiPAP- now on [place]. Sats improved. Management per primary team.   3. Acute combined diastolic/systolic CHF: Marked LVH on Echo raises possibility of infiltrative/restrictive  disease. May consider MRI at some point if renal function improves. She has an excellent response to diuretics with I/O negative 5.5 liters. However, weight is up today. She says she feels like she can lay flat. Right and left heart cath pressures will inform need for resumption of diuretics.   No ACE-I/ARB in the setting of renal insufficiency.   4. Hypokalemia, repleted.  5. Type 2 diabetes mellitus, on Januvia as an outpatient.  6. Hyperlipidemia, on Pravachol as an outpatient.  7. Hypertension with hypertensive heart disease- marked LVH, on Cozaar as an outpatient on hold for now due to ARF. May eventually resume once renal function stable. Now on bisoprolol. HR 55-60 so no room to titrate further.  8. SVT- brief run- monitor on bisoprolol. No recurrence   Signed, Little Ishikawa, NP  01/06/2017, 7:56 AM   Patient seen and examined and history reviewed. Agree with above findings and plan. Patient is doing well this am. Ambulating with PT. Feels breathing is doing well. Renal function improved. For right and left heart cath today. We can decide on additional diuretics depending on filling pressures.   Peter Swaziland, MDFACC 01/06/2017 9:10 AM

## 2017-01-06 NOTE — Progress Notes (Signed)
Patient has been NPO after midnight. Ready for procedure.  Patient with no complaints or concerns during 7pm - 7am shift.  Ajla Mcgeachy, RN

## 2017-01-06 NOTE — Progress Notes (Signed)
Discontinued TR band. Pt tolerated well. No signs and symptoms of hematoma.

## 2017-01-06 NOTE — Interval H&P Note (Signed)
History and Physical Interval Note:  01/06/2017 10:52 AM   Cath Lab Visit (complete for each Cath Lab visit)  Clinical Evaluation Leading to the Procedure:   ACS: Yes.    Non-ACS:    Anginal Classification: CCS III  Anti-ischemic medical therapy: Minimal Therapy (1 class of medications)  Non-Invasive Test Results: No non-invasive testing performed  Prior CABG: No previous CABG   Kelly [name]  has presented today for surgery, with the diagnosis of cardiomyopathy and positive troponins/non-STEMI. The various methods of treatment have been discussed with the patient and family. After consideration of risks, benefits and other options for treatment, the patient has consented to  Procedure(s): Right/Left Heart Cath and Coronary Angiography (N/A) with possible Percutaneous Coronary Intervention as a surgical intervention .  The patient's history has been reviewed, patient examined, no change in status, stable for surgery.  I have reviewed the patient's chart and labs.  Questions were answered to the patient's satisfaction.       Bryan Lemma

## 2017-01-06 NOTE — Progress Notes (Signed)
Progress Note  Patient Name: Kelly [name] Date of Encounter: 01/06/2017  Primary Cardiologist: New. Dr. [place]azil[place]   Subjective   Feels well, denies chest pain. Denies orthopnea.   Inpatient Medications    Scheduled Meds: . aspirin EC  81 mg Oral Daily  . azithromycin  500 mg Intravenous Q24H  . bisoprolol  5 mg Oral Daily  . budesonide (PULMICORT) nebulizer solution  0.25 mg Nebulization BID  . cefTRIAXone (ROCEPHIN)  IV  1 g Intravenous Q24H  . ferrous sulfate  325 mg Oral TID WC  . guaiFENesin  600 mg Oral BID  . heparin  5,000 Units Subcutaneous Q8H  . insulin aspart  0-20 Units Subcutaneous Q4H  . ipratropium-albuterol  3 mL Nebulization TID  . mouth rinse  15 mL Mouth Rinse BID  . pravastatin  40 mg Oral q1800  . predniSONE  50 mg Oral Q breakfast  . sodium chloride flush  3 mL Intravenous Q12H  . vitamin B-12  1,000 mcg Oral Daily   Continuous Infusions: . sodium chloride 50 mL/hr at 01/06/17 0501  . albuterol 20 mg/hr (01/01/17 1142)   PRN Meds: acetaminophen **OR** acetaminophen, guaiFENesin-dextromethorphan, hydrALAZINE, ipratropium-albuterol, ondansetron **OR** ondansetron (ZOFRAN) IV   Vital Signs    Vitals:   01/05/17 2116 01/05/17 2233 01/06/17 0034 01/06/17 0438  BP:  123/72 (!) 144/69 (!) 156/72  Pulse:  60 60 (!) 57  Resp:  18 19 19   Temp:  97.7 F (36.5 C) 98.2 F (36.8 C) 98 F (36.7 C)  TempSrc:  Axillary Oral Oral  SpO2: 98% 97% 97% 99%  Weight:    183 lb 3.2 oz (83.1 kg)  Height:        Intake/Output Summary (Last 24 hours) at 01/06/17 0756 Last data filed at 01/06/17 0519  Gross per 24 hour  Intake             1090 ml  Output             1470 ml  Net             -380 ml   Filed Weights   01/05/17 0427 01/05/17 1234 01/06/17 0438  Weight: 177 lb 6.4 oz (80.5 kg) 177 lb 7.5 oz (80.5 kg) 183 lb 3.2 oz (83.1 kg)    Telemetry    NSR - Personally Reviewed   Physical Exam   GEN: No acute distress.  Neck: No JVD Cardiac:  RRR, no murmurs, rubs, or gallops.  Respiratory: Clear to auscultation bilaterally. GI: Soft, nontender, non-distended  MS: No edema; No deformity. Neuro:  AAOx3. Psych: Normal affect  Labs    Chemistry Recent Labs Lab 01/01/17 1046  01/04/17 0251 01/05/17 0543 01/06/17 0356  NA 142  < > 139 141 141  K 3.5  < > 3.2* 4.3 4.1  CL 105  < > 101 102 104  CO2 25  < > 28 26 27   GLUCOSE 163*  < > 129* 146* 156*  BUN 23*  < > 63* 69* 65*  CREATININE 1.37*  < > 1.75* 1.50* 1.31*  CALCIUM 9.7  < > 8.6* 9.0 8.9  PROT 8.2*  --   --   --   --   ALBUMIN 3.9  --   --   --   --   AST 44*  --   --   --   --   ALT 57*  --   --   --   --   ALKPHOS  82  --   --   --   --   BILITOT 1.1  --   --   --   --   GFRNONAA 35*  < > 26* 31* 37*  GFRAA 40*  < > 30* 36* 42*  ANIONGAP 12  < > 10 13 10   < > = values in this interval not displayed.   Hematology Recent Labs Lab 01/04/17 0251 01/05/17 0543 01/06/17 0356  WBC 21.2* 18.5* 15.7*  RBC 2.79* 2.91* 2.93*  HGB 8.1* 8.4* 8.5*  HCT 24.9* 26.3* 26.7*  MCV 89.2 90.4 91.1  MCH 29.0 28.9 29.0  MCHC 32.5 31.9 31.8  RDW 16.3* 16.3* 16.0*  PLT 169 196 221    Cardiac Enzymes Recent Labs Lab 01/01/17 1609 01/01/17 2029 01/02/17 0226 01/02/17 1132  TROPONINI 3.26* 3.75* 3.91* 4.91*    Recent Labs Lab 01/01/17 1051  TROPIPOC 0.41*     BNP Recent Labs Lab 01/01/17 1046 01/03/17 0906  BNP 658.7* 458.4*     DDimer No results for input(s): DDIMER in the last 168 hours.   Radiology    No results found.  Cardiac Studies   Echocardiogram 01/03/17:Study Conclusions  - Left ventricle: The cavity size was normal. There was severe concentric hypertrophy. Systolic function was moderately reduced. The estimated ejection fraction was in the range of 35% to 40%. Wall motion was normal; there were no regional wall motion abnormalities. Features are consistent with a pseudonormal left ventricular filling pattern, with  concomitant abnormal relaxation and increased filling pressure (grade 2 diastolic dysfunction). Doppler parameters are consistent with elevated ventricular end-diastolic filling pressure. - Aortic root: The aortic root was normal in size. - Mitral valve: There was mild regurgitation. - Left atrium: The atrium was moderately dilated. - Right ventricle: Systolic function was normal. - Right atrium: The atrium was moderately dilated. - Tricuspid valve: There was moderate regurgitation. - Pulmonic valve: There was mild regurgitation. - Pulmonary arteries: PA peak pressure: 62 mm Hg (S). - Inferior vena cava: The vessel was dilated. The respirophasic diameter changes were blunted (<50%), consistent with elevated central venous pressure. - Pericardium, extracardiac: There was no pericardial effusion.  Impressions:  - Severe concentric left ventricular hypertrophy with mildly decreased LVEF and diffuse hypokinesis. A cardiac MRI is recommended to evaluate for possible infiltrative cardiomyopathy.    Patient Profile     83 y.o. female with history of COPD, apparent old left bundle branch block, and reported history of "3 heart attacks" in the past. States that she followed with a physician in [place] for prior cardiac events, does not indicate any prior cardiac catheterization or PCI. She presents with worsening shortness of breath over the last week, with coughing. Chest x-ray is concerning for possible associated pneumonia versus asymmetric edema. She is on broad-spectrum antibiotics with additional pulmonary treatment per primary team. Cardiac enzymes suggest NSTEMI.    Assessment & Plan    1. NSTEMI : Troponin I up to 4.9. No active chest pain. ECG shows left bundle branch block which is reportedly old. No old tracing for comparison.LV systolic function  mildly reduced estimate EF of 45% with some septal hypokinesis ? Due to LBBB. There is marked concentric LVH.  Biatrial enlargement. Moderate-severepulmonary HTN. IV heparin discontinued due to hemoptysis.   Will continue ASA, statin, add selective beta blocker- bisoprolol. Not a candidate for ACEi/ARB due to acute on CKD.  For right and left heart cath today, 01/06/17. Creatinine improved at 1.31.   2. COPD:  with exacerbation and possible associated community acquired pneumonia. Presented with hypoxic/hypercapnic respiratory failure, transiently on BiPAP- now on [place]. Sats improved. Management per primary team.   3. Acute combined diastolic/systolic CHF: Marked LVH on Echo raises possibility of infiltrative/restrictive  disease. May consider MRI at some point if renal function improves. She has an excellent response to diuretics with I/O negative 5.5 liters. However, weight is up today. She says she feels like she can lay flat. Right and left heart cath pressures will inform need for resumption of diuretics.   No ACE-I/ARB in the setting of renal insufficiency.   4. Hypokalemia, repleted.  5. Type 2 diabetes mellitus, on Januvia as an outpatient.  6. Hyperlipidemia, on Pravachol as an outpatient.  7. Hypertension with hypertensive heart disease- marked LVH, on Cozaar as an outpatient on hold for now due to ARF. May eventually resume once renal function stable. Now on bisoprolol. HR 55-60 so no room to titrate further.  8. SVT- brief run- monitor on bisoprolol. No recurrence   Signed, Little Ishikawa, NP  01/06/2017, 7:56 AM   Patient seen and examined and history reviewed. Agree with above findings and plan. Patient is doing well this am. Ambulating with PT. Feels breathing is doing well. Renal function improved. For right and left heart cath today. We can decide on additional diuretics depending on filling pressures.   Peter Swaziland, MDFACC 01/06/2017 9:10 AM

## 2017-01-07 DIAGNOSIS — N183 Chronic kidney disease, stage 3 (moderate): Secondary | ICD-10-CM

## 2017-01-07 DIAGNOSIS — E876 Hypokalemia: Secondary | ICD-10-CM

## 2017-01-07 DIAGNOSIS — I429 Cardiomyopathy, unspecified: Secondary | ICD-10-CM

## 2017-01-07 DIAGNOSIS — N179 Acute kidney failure, unspecified: Secondary | ICD-10-CM | POA: Diagnosis present

## 2017-01-07 DIAGNOSIS — I420 Dilated cardiomyopathy: Secondary | ICD-10-CM

## 2017-01-07 LAB — GLUCOSE, CAPILLARY
GLUCOSE-CAPILLARY: 105 mg/dL — AB (ref 65–99)
GLUCOSE-CAPILLARY: 134 mg/dL — AB (ref 65–99)
GLUCOSE-CAPILLARY: 135 mg/dL — AB (ref 65–99)
GLUCOSE-CAPILLARY: 234 mg/dL — AB (ref 65–99)
Glucose-Capillary: 123 mg/dL — ABNORMAL HIGH (ref 65–99)
Glucose-Capillary: 131 mg/dL — ABNORMAL HIGH (ref 65–99)

## 2017-01-07 LAB — BASIC METABOLIC PANEL
Anion gap: 7 (ref 5–15)
BUN: 54 mg/dL — AB (ref 6–20)
CALCIUM: 8.8 mg/dL — AB (ref 8.9–10.3)
CO2: 27 mmol/L (ref 22–32)
Chloride: 105 mmol/L (ref 101–111)
Creatinine, Ser: 1.29 mg/dL — ABNORMAL HIGH (ref 0.44–1.00)
GFR calc Af Amer: 43 mL/min — ABNORMAL LOW (ref >60)
GFR, EST NON AFRICAN AMERICAN: 37 mL/min — AB (ref >60)
GLUCOSE: 122 mg/dL — AB (ref 65–99)
Potassium: 4.1 mmol/L (ref 3.5–5.1)
SODIUM: 139 mmol/L (ref 135–145)

## 2017-01-07 MED ORDER — IPRATROPIUM-ALBUTEROL 0.5-2.5 (3) MG/3ML IN SOLN: 3.0000 mL | Freq: Four times a day (QID) | RESPIRATORY_TRACT | 0 refills | Status: DC | PRN

## 2017-01-07 MED ORDER — IPRATROPIUM-ALBUTEROL 0.5-2.5 (3) MG/3ML IN SOLN
3.0000 mL | Freq: Two times a day (BID) | RESPIRATORY_TRACT | Status: DC
  Administered 2017-01-07 – 2017-01-08 (×2): 3 mL via RESPIRATORY_TRACT
  Filled 2017-01-07 (×2): qty 3

## 2017-01-07 MED ORDER — BISOPROLOL FUMARATE 5 MG PO TABS: 5.0000 mg | ORAL_TABLET | Freq: Every day | ORAL | 0 refills | Status: DC

## 2017-01-07 MED ORDER — ALBUTEROL SULFATE HFA 108 (90 BASE) MCG/ACT IN AERS: 2.0000 | INHALATION_SPRAY | Freq: Four times a day (QID) | RESPIRATORY_TRACT | 2 refills | Status: DC | PRN

## 2017-01-07 MED ORDER — LOSARTAN POTASSIUM 100 MG PO TABS: 100.0000 mg | ORAL_TABLET | Freq: Every day | ORAL | 0 refills | Status: DC

## 2017-01-07 MED ORDER — PREDNISONE 50 MG PO TABS: ORAL_TABLET | ORAL | 0 refills | Status: AC

## 2017-01-07 MED ORDER — FUROSEMIDE 20 MG PO TABS
40.0000 mg | ORAL_TABLET | Freq: Every day | ORAL | 0 refills | Status: DC
Start: 2017-01-07 — End: 2017-01-27

## 2017-01-07 NOTE — Discharge Instructions (Signed)
Heart Failure °Heart failure is a condition in which the heart has trouble pumping blood because it has become weak or stiff. This means that the heart does not pump blood efficiently for the body to work well. For some people with heart failure, fluid may back up into the lungs and there may be swelling (edema) in the lower legs. Heart failure is usually a long-term (chronic) condition. It is important for you to take good care of yourself and follow the treatment plan from your health care provider. °What are the causes? °This condition is caused by some health problems, including: °· High blood pressure (hypertension). Hypertension causes the heart muscle to work harder than normal. High blood pressure eventually causes the heart to become stiff and weak. °· Coronary artery disease (CAD). CAD is the buildup of cholesterol and fat (plaques) in the arteries of the heart. °· Heart attack (myocardial infarction). Injured tissue, which is caused by the heart attack, does not contract as well and the heart's ability to pump blood is weakened. °· Abnormal heart valves. When the heart valves do not open and close properly, the heart muscle must pump harder to keep the blood flowing. °· Heart muscle disease (cardiomyopathy or myocarditis). Heart muscle disease is damage to the heart muscle from a variety of causes, such as drug or alcohol abuse, infections, or unknown causes. These can increase the risk of heart failure. °· Lung disease. When the lungs do not work properly, the heart must work harder. ° °What increases the risk? °Risk of heart failure increases as a person ages. This condition is also more likely to develop in people who: °· Are overweight. °· Are female. °· Smoke or chew tobacco. °· Abuse alcohol or illegal drugs. °· Have taken medicines that can damage the heart, such as chemotherapy drugs. °· Have diabetes. °? High blood sugar (glucose) is associated with high fat (lipid) levels in the blood. °? Diabetes  can also damage tiny blood vessels that carry nutrients to the heart muscle. °· Have abnormal heart rhythms. °· Have thyroid problems. °· Have low blood counts (anemia). ° °What are the signs or symptoms? °Symptoms of this condition include: °· Shortness of breath with activity, such as when climbing stairs. °· Persistent cough. °· Swelling of the feet, ankles, legs, or abdomen. °· Unexplained weight gain. °· Difficulty breathing when lying flat (orthopnea). °· Waking from sleep because of the need to sit up and get more air. °· Rapid heartbeat. °· Fatigue and loss of energy. °· Feeling light-headed, dizzy, or close to fainting. °· Loss of appetite. °· Nausea. °· Increased urination during the night (nocturia). °· Confusion. ° °How is this diagnosed? °This condition is diagnosed based on: °· Medical history, symptoms, and a physical exam. °· Diagnostic tests, which may include: °? Echocardiogram. °? Electrocardiogram (ECG). °? Chest X-ray. °? Blood tests. °? Exercise stress test. °? Radionuclide scans. °? Cardiac catheterization and angiogram. ° °How is this treated? °Treatment for this condition is aimed at managing the symptoms of heart failure. Medicines, behavioral changes, or other treatments may be necessary to treat heart failure. °Medicines °These may include: °· Angiotensin-converting enzyme (ACE) inhibitors. This type of medicine blocks the effects of a blood protein called angiotensin-converting enzyme. ACE inhibitors relax (dilate) the blood vessels and help to lower blood pressure. °· Angiotensin receptor blockers (ARBs). This type of medicine blocks the actions of a blood protein called angiotensin. ARBs dilate the blood vessels and help to lower blood pressure. °· Water   pills (diuretics). Diuretics cause the kidneys to remove salt and water from the blood. The extra fluid is removed through urination, leaving a lower volume of blood that the heart has to pump. °· Beta blockers. These improve heart  muscle strength and they prevent the heart from beating too quickly. °· Digoxin. This increases the force of the heartbeat. ° °Healthy behavior changes °These may include: °· Reaching and maintaining a healthy weight. °· Stopping smoking or chewing tobacco. °· Eating heart-healthy foods. °· Limiting or avoiding alcohol. °· Stopping use of street drugs (illegal drugs). °· Physical activity. ° °Other treatments °These may include: °· Surgery to open blocked coronary arteries or repair damaged heart valves. °· Placement of a biventricular pacemaker to improve heart muscle function (cardiac resynchronization therapy). This device paces both the right ventricle and left ventricle. °· Placement of a device to treat serious abnormal heart rhythms (implantable cardioverter defibrillator, or ICD). °· Placement of a device to improve the pumping ability of the heart (left ventricular assist device, or LVAD). °· Heart transplant. This can cure heart failure, and it is considered for certain patients who do not improve with other therapies. ° °Follow these instructions at home: °Medicines °· Take over-the-counter and prescription medicines only as told by your health care provider. Medicines are important in reducing the workload of your heart, slowing the progression of heart failure, and improving your symptoms. °? Do not stop taking your medicine unless your health care provider told you to do that. °? Do not skip any dose of medicine. °? Refill your prescriptions before you run out of medicine. You need your medicines every day. °Eating and drinking ° °· Eat heart-healthy foods. Talk with a dietitian to make an eating plan that is right for you. °? Choose foods that contain no trans fat and are low in saturated fat and cholesterol. Healthy choices include fresh or frozen fruits and vegetables, fish, lean meats, legumes, fat-free or low-fat dairy products, and whole-grain or high-fiber foods. °? Limit salt (sodium) if  directed by your health care provider. Sodium restriction may reduce symptoms of heart failure. Ask a dietitian to recommend heart-healthy seasonings. °? Use healthy cooking methods instead of frying. Healthy methods include roasting, grilling, broiling, baking, poaching, steaming, and stir-frying. °· Limit your fluid intake if directed by your health care provider. Fluid restriction may reduce symptoms of heart failure. °Lifestyle °· Stop smoking or using chewing tobacco. Nicotine and tobacco can damage your heart and your blood vessels. Do not use nicotine gum or patches before talking to your health care provider. °· Limit alcohol intake to no more than 1 drink per day for non-pregnant women and 2 drinks per day for men. One drink equals 12 oz of beer, 5 oz of wine, or 1½ oz of hard liquor. °? Drinking more than that is harmful to your heart. Tell your health care provider if you drink alcohol several times a week. °? Talk with your health care provider about whether any level of alcohol use is safe for you. °? If your heart has already been damaged by alcohol or you have severe heart failure, drinking alcohol should be stopped completely. °· Stop use of illegal drugs. °· Lose weight if directed by your health care provider. Weight loss may reduce symptoms of heart failure. °· Do moderate physical activity if directed by your health care provider. People who are elderly and people with severe heart failure should consult with a health care provider for physical activity recommendations. °  Monitor important information °· Weigh yourself every day. Keeping track of your weight daily helps you to notice excess fluid sooner. °? Weigh yourself every morning after you urinate and before you eat breakfast. °? Wear the same amount of clothing each time you weigh yourself. °? Record your daily weight. Provide your health care provider with your weight record. °· Monitor and record your blood pressure as told by your health  care provider. °· Check your pulse as told by your health care provider. °Dealing with extreme temperatures °· If the weather is extremely hot: °? Avoid vigorous physical activity. °? Use air conditioning or fans or seek a cooler location. °? Avoid caffeine and alcohol. °? Wear loose-fitting, lightweight, and light-colored clothing. °· If the weather is extremely cold: °? Avoid vigorous physical activity. °? Layer your clothes. °? Wear mittens or gloves, a hat, and a scarf when you go outside. °? Avoid alcohol. °General instructions °· Manage other health conditions such as hypertension, diabetes, thyroid disease, or abnormal heart rhythms as told by your health care provider. °· Learn to manage stress. If you need help to do this, ask your health care provider. °· Plan rest periods when fatigued. °· Get ongoing education and support as needed. °· Participate in or seek rehabilitation as needed to maintain or improve independence and quality of life. °· Stay up to date with immunizations. Keeping current on pneumococcal and influenza immunizations is especially important to prevent respiratory infections. °· Keep all follow-up visits as told by your health care provider. This is important. °Contact a health care provider if: °· You have a rapid weight gain. °· You have increasing shortness of breath that is unusual for you. °· You are unable to participate in your usual physical activities. °· You tire easily. °· You cough more than normal, especially with physical activity. °· You have any swelling or more swelling in areas such as your hands, feet, ankles, or abdomen. °· You are unable to sleep because it is hard to breathe. °· You feel like your heart is beating quickly (palpitations). °· You become dizzy or light-headed when you stand up. °Get help right away if: °· You have difficulty breathing. °· You notice or your family notices a change in your awareness, such as having trouble staying awake or having  difficulty with concentration. °· You have pain or discomfort in your chest. °· You have an episode of fainting (syncope). °This information is not intended to replace advice given to you by your health care provider. Make sure you discuss any questions you have with your health care provider. °Document Released: 11/29/2005 Document Revised: 08/03/2016 Document Reviewed: 06/23/2016 °Elsevier Interactive Patient Education © 2017 [employer] ° °

## 2017-01-07 NOTE — Progress Notes (Signed)
Physical Therapy Treatment Patient Details Name: Kelly [name] MRN: [id number] DOB: 1931-03-21 Today's Date: Jan 23, 2017    History of Present Illness Pt adm with SOB and found to have NSTEMI versus demand ischemia. Also acute on chronic heart failure. PMH - HTN, DM, MI    PT Comments    Pt continues to make steady progress. Remains deconditioned and continue to recommend ST-SNF prior to return to son's home.   Follow Up Recommendations  SNF     Equipment Recommendations  Rolling walker with 5" wheels    Recommendations for Other Services       Precautions / Restrictions Precautions Precautions: Fall Restrictions Weight Bearing Restrictions: No    Mobility  Bed Mobility Overal bed mobility: Needs Assistance Bed Mobility: Supine to Sit     Supine to sit: Supervision     General bed mobility comments: Incr time  Transfers Overall transfer level: Needs assistance Equipment used: Rolling walker (2 wheeled) Transfers: Sit to/from Stand Sit to Stand: Min assist         General transfer comment: Assist to bring hips up and for balance. Verbal cues for hand placement.  Ambulation/Gait Ambulation/Gait assistance: Min assist Ambulation Distance (Feet): 100 Feet Assistive device: Rolling walker (2 wheeled) Gait Pattern/deviations: Step-through pattern;Decreased step length - right;Decreased step length - left;Trunk flexed Gait velocity: decr Gait velocity interpretation: Below normal speed for age/gender General Gait Details: Assist for balance and support. Amb on RA with SpO2 95% after amb   Stairs            Wheelchair Mobility    Modified Rankin (Stroke Patients Only)       Balance Overall balance assessment: Needs assistance Sitting-balance support: No upper extremity supported;Feet supported Sitting balance-Leahy Scale: Good     Standing balance support: Bilateral upper extremity supported Standing balance-Leahy Scale: Poor Standing  balance comment: walker and supervision for static standing                    Cognition Arousal/Alertness: Awake/alert Behavior During Therapy: WFL for tasks assessed/performed Overall Cognitive Status: Within Functional Limits for tasks assessed                      Exercises      General Comments        Pertinent Vitals/Pain Pain Assessment: No/denies pain    Home Living                      Prior Function            PT Goals (current goals can now be found in the care plan section) Progress towards PT goals: Progressing toward goals    Frequency    Min 3X/week      PT Plan Current plan remains appropriate    Co-evaluation             End of Session Equipment Utilized During Treatment: Gait belt Activity Tolerance: Patient tolerated treatment well Patient left: in chair;with chair alarm set;with call bell/phone within reach     Time: 9518-8416 PT Time Calculation (min) (ACUTE ONLY): 21 min  Charges:  $Gait Training: 8-22 mins                    G CodesAngelina Ok [facility] 01/23/17, 12:51 PM [employer] PT [phone]

## 2017-01-07 NOTE — Clinical Social Work Note (Addendum)
CSW presented bed offers to patient. Patient stated that her son would be making the choice and gave CSW permission to call him. CSW called patient's son. He will call CSW back in one hour as he was walking out to lunch.  Charlynn Court, CSW [phone]  2:16 pm Patient's son returned call. He has chosen [facility]. Patient is agreeable. SNF notified. Admissions coordinator will come to hospital to do paperwork with patient today. They asked if we could get dc summary today. CSW paged MD to ask.  Charlynn Court, CSW [phone]

## 2017-01-07 NOTE — Discharge Summary (Addendum)
Physician Discharge Summary  Kelly [name] ZOX:[id number] DOB: 1931/07/04 DOA: 01/01/2017  PCP: No primary care provider on file.  Admit date: 01/01/2017 Discharge date: 01/08/2017  Admitted From: Home Disposition:  SNF  Recommendations for Outpatient Follow-up:  1. Follow up M.D. At SNF in 1 week 2. Follow-up with cardiology (Dr. Swaziland in 3 weeks)    Equipment/Devices: none  Discharge Condition: Fair CODE STATUS: Full code Diet recommendation: Heart Healthy     Discharge Diagnoses:  Principal Problem:   Acute respiratory failure with hypercapnia (HCC)   Active Problems:   Congestive restrictive cardiomyopathy (HCC)   NSTEMI (non-ST elevated myocardial infarction) (HCC)   COPD with acute exacerbation (HCC)   LBBB (left bundle branch block)   Troponin I above reference range   Hypertensive heart disease   Nonischemic congestive cardiomyopathy (HCC)   Acute renal failure superimposed on stage 3 chronic kidney disease (HCC)  Brief narrative/history of present illness Please refer to admission H&P for details, in brief, 83 y.o.female with h/o COPD, CKD, LBBB, and HTN. She lives in [place], but was visiting her son in [place] when saturday she came to the ER due to SOB. EKG revealed NSTEMI and she was admitted for further workup. Hospital course prolonged due to acute pulmonary edema and worsening of renal function.  Hospital course Acute respiratory failure with hypoxia and hypercapnia:  COPD exacerbation and acute pulmonary edema. Improved with IV Lasix.   pleated empiric antibiotics.   was discharged on oral prednisone for 5 more days. Continue Advair and Ellipta. Prn albuterol inhaler.  Bilateral Pleural Effusions:  Secondary to acute pulmonary edema. Responded to IV Lasix. Now much improved and diuresed well ;7 L negative since admission). Echo shows EF 35-40% with severe LVH.   COPD with acute exacerbation (HCC):  As outlined above. Continue oral  prednisone, with oxygen as tolerated. When necessary nebs and antitussives. Stable on RA today , no need for o2 upon discharge.  NSTEMI (non-ST elevated myocardial infarction): Echo showed EF 35-40% with grade 2 diastolic dysfunction.Elevated PA pressure was (62 mmhg), troponin peaked at 4.91. Cardiac cath showing clean coronaries. Shows moderately elevated LV end diastolic pressure and mean pulmonary pressures. Significant LVH on echo suggestive of restrictive physiology. Suspect demand ischemia given clean coronaries. Continue ASA, statin, bisoprolol.    AKI on CKD (chronic kidney disease):  Possibly secondary to urinary retention.suspect underlying CKD stage 3. Improved. (Possibly at baseline). Discontinue HCTZ. If renal function stable as outpatient may resume low dose ACEi/ ARB.   Nonischemic Cardiomyopathy:  Asymptomatic at present. Will hold ARB due to worsened renal function. Continue aspirin, low-dose beta blocker and statin. Increased daily Lasix dose   Acute urinary retention:  Required foley during hospitalization. Now removed and voiding without difficulty.  Iron deficiency/B12 deficiency anemia. Resumed and B12 supplement.  Hypertensive heart disease:  Continue with bisoprolol and statin.  LBBB (left bundle branch block):  Pre-existing as stated by patient. Continue bisoprolol (dose reduced).     Type 2  diabetes mellitus cbg stable.. Hgb A1C on 01/01/17 was 5.5. Resume Januvia  Hypokalemia Replenished.    Seen by physical therapy and recommended skilled nursing facility.  Family Communication  :  discussed with son  Disposition Plan  :  skilled nursing facility   Consults  :  Cardiology  Procedures  :  2-D echo Left heart catheterization   Discharge Instructions   Allergies as of 01/07/2017   No Known Allergies     Medication List    STOP  taking these medications   losartan-hydrochlorothiazide 100-12.5 MG tablet Commonly  known as:  HYZAAR   triamterene-hydrochlorothiazide 37.5-25 MG capsule Commonly known as:  DYAZIDE     TAKE these medications   albuterol 108 (90 Base) MCG/ACT inhaler Commonly known as:  PROVENTIL HFA;VENTOLIN HFA Inhale 2 puffs into the lungs every 6 (six) hours as needed for wheezing or shortness of breath.   aspirin 325 MG tablet Take 325 mg by mouth daily.   bisoprolol 5 MG tablet Commonly known as:  ZEBETA Take 1 tablet (5 mg total) by mouth daily. Start taking on:  01/08/2017   ferrous sulfate 325 (65 FE) MG EC tablet Take 325 mg by mouth 3 (three) times daily with meals.   Fluticasone-Salmeterol 500-50 MCG/DOSE Aepb Commonly known as:  ADVAIR Inhale 1 puff into the lungs 2 (two) times daily.   furosemide 20 MG tablet Commonly known as:  LASIX Take 2 tablets (40 mg total) by mouth daily. What changed:  how much to take  when to take this   INCRUSE ELLIPTA 62.5 MCG/INH Aepb Generic drug:  umeclidinium bromide Inhale 1 puff into the lungs daily.   ipratropium-albuterol 0.5-2.5 (3) MG/3ML Soln Commonly known as:  DUONEB Take 3 mLs by nebulization every 6 (six) hours as needed (wheezing and shortness of breath).      pravastatin 40 MG tablet Commonly known as:  PRAVACHOL Take 40 mg by mouth daily.   predniSONE 50 MG tablet Commonly known as:  DELTASONE 1 tablet daily for 5 days Start taking on:  01/09/2017   sitaGLIPtin 25 MG tablet Commonly known as:  JANUVIA Take 25 mg by mouth daily.   vitamin B-12 1000 MCG tablet Commonly known as:  CYANOCOBALAMIN Take 1,000 mcg by mouth daily.   Vitamin D3 1000 units Caps Take 1,000 Units by mouth daily.      Follow-up Information    Peter Swaziland, MD. Schedule an appointment as soon as possible for a visit in 3 week(s).   Specialty:  Cardiology Contact information: [address] [place] [address] [phone]        MD at SNF in 1 week Follow up.          No Known  Allergies     Procedures/Studies: Dg Chest 1 View  Result Date: 01/02/2017 CLINICAL DATA:  Shortness of breath. EXAM: CHEST 1 VIEW COMPARISON:  January 02, 2016 FINDINGS: Stable cardiomediastinal silhouette and cardiomegaly. Bilateral pulmonary infiltrates are identified, similar in the interval. No other interval changes. IMPRESSION: Cardiomegaly. Diffuse bilateral pulmonary opacities persist, unchanged in the interval. Electronically Signed   By: Gerome Sam III M.D   On: 01/02/2017 07:20   Dg Chest Port 1 View  Result Date: 01/03/2017 CLINICAL DATA:  83 year old female with hemoptysis, congestion, and shortness of breath. EXAM: PORTABLE CHEST 1 VIEW COMPARISON:  Chest radiograph dated 01/02/2017 FINDINGS: There is persistent bilateral airspace opacities. Trace bilateral pleural effusions may be present. There is no pneumothorax. There is stable mild cardiomegaly. No acute osseous pathology. IMPRESSION: Persistent bilateral airspace opacities. Clinical correlation and follow-up recommended. Electronically Signed   By: Elgie Collard M.D.   On: 01/03/2017 01:34   Dg Chest Port 1 View  Result Date: 01/01/2017 CLINICAL DATA:  Respiratory distress. EXAM: PORTABLE CHEST 1 VIEW COMPARISON:  None. FINDINGS: Cardiomegaly. Bilateral pulmonary opacities, right greater than left. No pneumothorax. No other acute abnormalities. IMPRESSION: Diffuse bilateral pulmonary opacities, right greater than left. Opacity is more confluent and patchy in the right base.  The findings could represent asymmetric edema but diffuse infection is not completely excluded. Recommend clinical correlation and follow-up. Electronically Signed   By: Gerome Sam III M.D   On: 01/01/2017 10:54   Dg Abd Portable 1v  Result Date: 01/01/2017 CLINICAL DATA:  Shortness of breath and abdominal distention. EXAM: PORTABLE ABDOMEN - 1 VIEW COMPARISON:  None FINDINGS: Mild gaseous distension of the small and large bowel loops. Gas and  stool noted throughout the colon up level the rectum. IMPRESSION: Nonobstructive bowel gas pattern. Electronically Signed   By: Signa Kell M.D.   On: 01/01/2017 14:52     2-D echo  Study Conclusions  - Left ventricle: The cavity size was normal. There was severe   concentric hypertrophy. Systolic function was moderately reduced.   The estimated ejection fraction was in the range of 35% to 40%.   Wall motion was normal; there were no regional wall motion   abnormalities. Features are consistent with a pseudonormal left   ventricular filling pattern, with concomitant abnormal relaxation   and increased filling pressure (grade 2 diastolic dysfunction).   Doppler parameters are consistent with elevated ventricular   end-diastolic filling pressure. - Aortic root: The aortic root was normal in size. - Mitral valve: There was mild regurgitation. - Left atrium: The atrium was moderately dilated. - Right ventricle: Systolic function was normal. - Right atrium: The atrium was moderately dilated. - Tricuspid valve: There was moderate regurgitation. - Pulmonic valve: There was mild regurgitation. - Pulmonary arteries: PA peak pressure: 62 mm Hg (S). - Inferior vena cava: The vessel was dilated. The respirophasic   diameter changes were blunted (< 50%), consistent with elevated   central venous pressure. - Pericardium, extracardiac: There was no pericardial effusion.  Impressions:  - Severe concentric left ventricular hypertrophy with mildly   decreased LVEF and diffuse hypokinesis.   A cardiac MRI is recommended to evaluate for possible   infiltrative cardiomyopathy.    Subjective: Breathing continues to improve.  Discharge Exam: Vitals:   01/07/17 0803 01/07/17 1016  BP: (!) 127/51 (!) 118/49  Pulse: (!) 48 (!) 54  Resp: 16   Temp: 97.7 F (36.5 C)    Vitals:   01/07/17 0803 01/07/17 0929 01/07/17 1016 01/07/17 1150  BP: (!) 127/51  (!) 118/49   Pulse: (!) 48  (!)  54   Resp: 16     Temp: 97.7 F (36.5 C)     TempSrc: Oral     SpO2: 100% 96% 96% 95%  Weight:      Height:        Gen: not in distress HEENT:  moist mucosa, supple neck Chest: clear b/l, no added sounds CVS: N S1&S2, no murmurs GI: soft, NT, ND,  Musculoskeletal: warm, no edema, right radial Cath site is clean.     The results of significant diagnostics from this hospitalization (including imaging, microbiology, ancillary and laboratory) are listed below for reference.     Microbiology: Recent Results (from the past 240 hour(s))  Respiratory Panel by PCR     Status: None   Collection Time: 01/01/17  2:40 PM  Result Value Ref Range Status   Adenovirus NOT DETECTED NOT DETECTED Final   Coronavirus 229E NOT DETECTED NOT DETECTED Final   Coronavirus HKU1 NOT DETECTED NOT DETECTED Final   Coronavirus NL63 NOT DETECTED NOT DETECTED Final   Coronavirus OC43 NOT DETECTED NOT DETECTED Final   Metapneumovirus NOT DETECTED NOT DETECTED Final   Rhinovirus / Enterovirus NOT  DETECTED NOT DETECTED Final   Influenza A NOT DETECTED NOT DETECTED Final   Influenza B NOT DETECTED NOT DETECTED Final   Parainfluenza Virus 1 NOT DETECTED NOT DETECTED Final   Parainfluenza Virus 2 NOT DETECTED NOT DETECTED Final   Parainfluenza Virus 3 NOT DETECTED NOT DETECTED Final   Parainfluenza Virus 4 NOT DETECTED NOT DETECTED Final   Respiratory Syncytial Virus NOT DETECTED NOT DETECTED Final   Bordetella pertussis NOT DETECTED NOT DETECTED Final   Chlamydophila pneumoniae NOT DETECTED NOT DETECTED Final   Mycoplasma pneumoniae NOT DETECTED NOT DETECTED Final  MRSA PCR Screening     Status: None   Collection Time: 01/01/17  6:01 PM  Result Value Ref Range Status   MRSA by PCR NEGATIVE NEGATIVE Final    Comment:        The GeneXpert MRSA Assay (FDA approved for NASAL specimens only), is one component of a comprehensive MRSA colonization surveillance program. It is not intended to diagnose  MRSA infection nor to guide or monitor treatment for MRSA infections.      Labs: BNP (last 3 results)  Recent Labs  01/01/17 1046 01/03/17 0906  BNP 658.7* 458.4*   Basic Metabolic Panel:  Recent Labs Lab 01/01/17 1609 01/02/17 0226 01/03/17 0246 01/04/17 0251 01/05/17 0543 01/06/17 0356 01/06/17 1244 01/07/17 0405  NA  --  142 142 139 141 141  --  139  K  --  2.9* 3.6 3.2* 4.3 4.1  --  4.1  CL  --  104 105 101 102 104  --  105  CO2  --  27 24 28 26 27   --  27  GLUCOSE  --  152* 159* 129* 146* 156*  --  122*  BUN  --  30* 45* 63* 69* 65*  --  54*  CREATININE  --  1.39* 1.61* 1.75* 1.50* 1.31* 1.28* 1.29*  CALCIUM  --  8.9 9.3 8.6* 9.0 8.9  --  8.8*  MG  --  2.1  --   --   --   --   --   --   PHOS 4.5  --   --   --   --   --   --   --    Liver Function Tests:  Recent Labs Lab 01/01/17 1046  AST 44*  ALT 57*  ALKPHOS 82  BILITOT 1.1  PROT 8.2*  ALBUMIN 3.9   No results for input(s): LIPASE, AMYLASE in the last 168 hours. No results for input(s): AMMONIA in the last 168 hours. CBC:  Recent Labs Lab 01/01/17 1046  01/03/17 0246 01/04/17 0251 01/05/17 0543 01/06/17 0356 01/06/17 1244  WBC 20.8*  < > 30.3* 21.2* 18.5* 15.7* 15.7*  NEUTROABS 9.5*  --   --   --   --   --   --   HGB 10.4*  < > 9.1* 8.1* 8.4* 8.5* 9.1*  HCT 33.6*  < > 27.7* 24.9* 26.3* 26.7* 28.7*  MCV 94.1  < > 89.6 89.2 90.4 91.1 91.4  PLT 232  < > 187 169 196 221 219  < > = values in this interval not displayed. Cardiac Enzymes:  Recent Labs Lab 01/01/17 1609 01/01/17 2029 01/02/17 0226 01/02/17 1132  TROPONINI 3.26* 3.75* 3.91* 4.91*   BNP: Invalid input(s): POCBNP CBG:  Recent Labs Lab 01/06/17 2024 01/07/17 0013 01/07/17 0445 01/07/17 0547 01/07/17 1147  GLUCAP 118* 134* 123* 105* 135*   D-Dimer No results for input(s): DDIMER in the last  72 hours. Hgb A1c No results for input(s): HGBA1C in the last 72 hours. Lipid Profile No results for input(s): CHOL,  HDL, LDLCALC, TRIG, CHOLHDL, LDLDIRECT in the last 72 hours. Thyroid function studies No results for input(s): TSH, T4TOTAL, T3FREE, THYROIDAB in the last 72 hours.  Invalid input(s): FREET3 Anemia work up No results for input(s): VITAMINB12, FOLATE, FERRITIN, TIBC, IRON, RETICCTPCT in the last 72 hours. Urinalysis    Component Value Date/Time   COLORURINE YELLOW 01/01/2017 2352   APPEARANCEUR CLEAR 01/01/2017 2352   LABSPEC 1.008 01/01/2017 2352   PHURINE 5.0 01/01/2017 2352   GLUCOSEU NEGATIVE 01/01/2017 2352   HGBUR SMALL (A) 01/01/2017 2352   BILIRUBINUR NEGATIVE 01/01/2017 2352   KETONESUR NEGATIVE 01/01/2017 2352   PROTEINUR NEGATIVE 01/01/2017 2352   NITRITE NEGATIVE 01/01/2017 2352   LEUKOCYTESUR NEGATIVE 01/01/2017 2352   Sepsis Labs Invalid input(s): PROCALCITONIN,  WBC,  LACTICIDVEN Microbiology Recent Results (from the past 240 hour(s))  Respiratory Panel by PCR     Status: None   Collection Time: 01/01/17  2:40 PM  Result Value Ref Range Status   Adenovirus NOT DETECTED NOT DETECTED Final   Coronavirus 229E NOT DETECTED NOT DETECTED Final   Coronavirus HKU1 NOT DETECTED NOT DETECTED Final   Coronavirus NL63 NOT DETECTED NOT DETECTED Final   Coronavirus OC43 NOT DETECTED NOT DETECTED Final   Metapneumovirus NOT DETECTED NOT DETECTED Final   Rhinovirus / Enterovirus NOT DETECTED NOT DETECTED Final   Influenza A NOT DETECTED NOT DETECTED Final   Influenza B NOT DETECTED NOT DETECTED Final   Parainfluenza Virus 1 NOT DETECTED NOT DETECTED Final   Parainfluenza Virus 2 NOT DETECTED NOT DETECTED Final   Parainfluenza Virus 3 NOT DETECTED NOT DETECTED Final   Parainfluenza Virus 4 NOT DETECTED NOT DETECTED Final   Respiratory Syncytial Virus NOT DETECTED NOT DETECTED Final   Bordetella pertussis NOT DETECTED NOT DETECTED Final   Chlamydophila pneumoniae NOT DETECTED NOT DETECTED Final   Mycoplasma pneumoniae NOT DETECTED NOT DETECTED Final  MRSA PCR Screening      Status: None   Collection Time: 01/01/17  6:01 PM  Result Value Ref Range Status   MRSA by PCR NEGATIVE NEGATIVE Final    Comment:        The GeneXpert MRSA Assay (FDA approved for NASAL specimens only), is one component of a comprehensive MRSA colonization surveillance program. It is not intended to diagnose MRSA infection nor to guide or monitor treatment for MRSA infections.      Time coordinating discharge: Over 30 minutes  SIGNED:   Eddie North, MD  [facility] 01/07/2017, 2:39 PM Pager   If 7PM-7AM, please contact night-coverage [email or web address] Password [email or web address]

## 2017-01-07 NOTE — Progress Notes (Signed)
MD can we change her CBG checks to [facility], coverage was changed to [facility], thanks Glenna Fellows.

## 2017-01-07 NOTE — Progress Notes (Signed)
Progress Note  Patient Name: Kelly [name] Date of Encounter: 01/07/2017  Primary Cardiologist: New. Dr. Martinique   Subjective   Feels well, denies chest pain. Denies orthopnea. Ambulating in halls with PT  Inpatient Medications    Scheduled Meds: . aspirin EC  81 mg Oral Daily  . azithromycin  500 mg Intravenous Q24H  . bisoprolol  5 mg Oral Daily  . budesonide (PULMICORT) nebulizer solution  0.25 mg Nebulization BID  . cefTRIAXone (ROCEPHIN)  IV  1 g Intravenous Q24H  . ferrous sulfate  325 mg Oral TID WC  . furosemide  40 mg Oral Daily  . guaiFENesin  600 mg Oral BID  . heparin  5,000 Units Subcutaneous Q8H  . insulin aspart  0-20 Units Subcutaneous TID WC  . ipratropium-albuterol  3 mL Nebulization BID  . mouth rinse  15 mL Mouth Rinse BID  . pravastatin  40 mg Oral q1800  . predniSONE  50 mg Oral Q breakfast  . sodium chloride flush  3 mL Intravenous Q12H  . sodium chloride flush  3 mL Intravenous Q12H  . vitamin B-12  1,000 mcg Oral Daily   Continuous Infusions: . albuterol 20 mg/hr (01/01/17 1142)   PRN Meds: sodium chloride, acetaminophen **OR** acetaminophen, guaiFENesin-dextromethorphan, hydrALAZINE, ipratropium-albuterol, ondansetron **OR** ondansetron (ZOFRAN) IV, sodium chloride flush   Vital Signs    Vitals:   01/07/17 0453 01/07/17 0803 01/07/17 0929 01/07/17 1016  BP: (!) 125/56 (!) 127/51  (!) 118/49  Pulse: (!) 50 (!) 48  (!) 54  Resp: 19 16    Temp: 98.1 F (36.7 C) 97.7 F (36.5 C)    TempSrc: Oral Oral    SpO2: 100% 100% 96% 96%  Weight: 177 lb 1.6 oz (80.3 kg)     Height:        Intake/Output Summary (Last 24 hours) at 01/07/17 1121 Last data filed at 01/07/17 1052  Gross per 24 hour  Intake              943 ml  Output             2450 ml  Net            -1507 ml   Filed Weights   01/05/17 1234 01/06/17 0438 01/07/17 0453  Weight: 177 lb 7.5 oz (80.5 kg) 183 lb 3.2 oz (83.1 kg) 177 lb 1.6 oz (80.3 kg)    Telemetry      NSR/sinus brady rate 50.- Personally Reviewed   Physical Exam   GEN: No acute distress.  Neck: No JVD Cardiac: RRR, no murmurs, rubs, or gallops.  Respiratory: Clear to auscultation bilaterally. GI: Soft, nontender, non-distended  MS: No edema; No deformity. Neuro:  AAOx3. Psych: Normal affect  Labs    Chemistry Recent Labs Lab 01/01/17 1046  01/05/17 0543 01/06/17 0356 01/06/17 1244 01/07/17 0405  NA 142  < > 141 141  --  139  K 3.5  < > 4.3 4.1  --  4.1  CL 105  < > 102 104  --  105  CO2 25  < > 26 27  --  27  GLUCOSE 163*  < > 146* 156*  --  122*  BUN 23*  < > 69* 65*  --  54*  CREATININE 1.37*  < > 1.50* 1.31* 1.28* 1.29*  CALCIUM 9.7  < > 9.0 8.9  --  8.8*  PROT 8.2*  --   --   --   --   --  ALBUMIN 3.9  --   --   --   --   --   AST 44*  --   --   --   --   --   ALT 57*  --   --   --   --   --   ALKPHOS 82  --   --   --   --   --   BILITOT 1.1  --   --   --   --   --   GFRNONAA 35*  < > 31* 37* 38* 37*  GFRAA 40*  < > 36* 42* 44* 43*  ANIONGAP 12  < > 13 10  --  7  < > = values in this interval not displayed.   Hematology  Recent Labs Lab 01/05/17 0543 01/06/17 0356 01/06/17 1244  WBC 18.5* 15.7* 15.7*  RBC 2.91* 2.93* 3.14*  HGB 8.4* 8.5* 9.1*  HCT 26.3* 26.7* 28.7*  MCV 90.4 91.1 91.4  MCH 28.9 29.0 29.0  MCHC 31.9 31.8 31.7  RDW 16.3* 16.0* 16.1*  PLT 196 221 219    Cardiac Enzymes  Recent Labs Lab 01/01/17 1609 01/01/17 2029 01/02/17 0226 01/02/17 1132  TROPONINI 3.26* 3.75* 3.91* 4.91*     Recent Labs Lab 01/01/17 1051  TROPIPOC 0.41*     BNP  Recent Labs Lab 01/01/17 1046 01/03/17 0906  BNP 658.7* 458.4*     DDimer No results for input(s): DDIMER in the last 168 hours.   Radiology    No results found.  Cardiac Studies   Echocardiogram 01/03/17:Study Conclusions  - Left ventricle: The cavity size was normal. There was severe concentric hypertrophy. Systolic function was moderately reduced. The  estimated ejection fraction was in the range of 35% to 40%. Wall motion was normal; there were no regional wall motion abnormalities. Features are consistent with a pseudonormal left ventricular filling pattern, with concomitant abnormal relaxation and increased filling pressure (grade 2 diastolic dysfunction). Doppler parameters are consistent with elevated ventricular end-diastolic filling pressure. - Aortic root: The aortic root was normal in size. - Mitral valve: There was mild regurgitation. - Left atrium: The atrium was moderately dilated. - Right ventricle: Systolic function was normal. - Right atrium: The atrium was moderately dilated. - Tricuspid valve: There was moderate regurgitation. - Pulmonic valve: There was mild regurgitation. - Pulmonary arteries: PA peak pressure: 62 mm Hg (S). - Inferior vena cava: The vessel was dilated. The respirophasic diameter changes were blunted (<50%), consistent with elevated central venous pressure. - Pericardium, extracardiac: There was no pericardial effusion.  Impressions:  - Severe concentric left ventricular hypertrophy with mildly decreased LVEF and diffuse hypokinesis. A cardiac MRI is recommended to evaluate for possible infiltrative cardiomyopathy.  Procedures   Right/Left Heart Cath and Coronary Angiography  Conclusion     LV end diastolic pressure is moderately elevated consistent with moderately elevated mean pulmonary pressures.  Hemodynamic findings consistent with mild secondary pulmonary hypertension.  Angiographically normal coronary arteries with a right dominant system. Somewhat tortuous RCA and circumflex/ramus systems    Nonischemic Cardiomyopathy with moderate to severely reduced cardiac output/index. Persistently elevated filling pressures indicating continued volume overload  Plan:  Return to nursing unit for ongoing care and TR band removal. The brachial sheath was  already removed in the Cath Lab  Per discussion with Dr. Martinique, we will start her on oral Lasix 40 mg daily starting today.  Further recommendations per Dr. Doug Sou consultation.    Glenetta Hew, M.D., M.S.  Interventional Cardiologist   Pager # [phone] Phone # [phone] [place], [place] [address]    Procedural Details/Technique   Technical Details PCP: No primary care provider on file. CARDIOLOGIST: Dr. Martinique  83 year old woman who presented with acute onset dyspnea with delayed presentation. In this setting she was noted to have troponin levels increased to 4.9. This was unsure if there was no isolation MI or demand ischemia. Echocardiogram showed reduced ejection fraction of 35% with global hypokinesis. She has been diuresed and stabilized, and is now referred for right left heart catheterization for possible ischemic cardiomyopathy versus hypertensive cardiomyopathy.  No sedation was given as she was relatively comfortable and somewhat drowsy to begin with.  Time Out: Verified patient identification, verified procedure, site/side was marked, verified correct patient position, special equipment/implants available, medications/allergies/relevent history reviewed, required imaging and test results available. Performed.  Access:  RIGHT Radial Artery: 6 Fr sheath -- Seldinger technique using Angiocath Micropuncture Kit 10 mL radial cocktail IA; 4000 Units IV Heparin Right Brachial/Antecubital Vein: The existing 18-gauge IV was exchanged over a wire for a 5Fr short sheath  Right Heart Catheterization: 5 Fr Gordy Councilman catheter advanced under fluoroscopy -- using 0.25 Glidewire. Once in the RA, with balloon inflated to the RA, RV, then PA for hemodynamic measurement. Able to advance into the PCWP position Simultaneous FA & PA blood gases checked for SaO2% to calculate FICK CO/CI  Catheter removed completely out of the body with balloon  deflated.  Left Heart Catheterization: 5 Fr TIG 4.0 catheter advanced or exchanged over a J-wire under direct fluoroscopic guidance into the ascending aorta; the catheter was first advanced across the aortic valve for left ventricular hemodynamics. He was then used to engage both the At and Right Coronary Arteries for cineangiography. The catheter was then removed completely out of body over wire without complication.  Femoral / Brachial Sheath(s) removed in the Cath Lab with manual pressure for hemostasis.   Radial sheath removed in the Cardiac Catheterization lab with TR Band placed for hemostasis. Manual pressure was held on either end of the TR band for a very small hematoma noted on each side. This was reduced easily with manual pressure.  TR Band: 1150 Hours; 11 mL air  MEDICATIONS * SQ Lidocaine 64m * Radial Cocktail: 3 mg Verapamil 10 mL NS * Isovue Contrast: 40 mL * Heparin: 4000 Units   Estimated blood loss <50 mL.  During this procedure no sedation was administered.    Complications   Complications documented before study signed (01/06/2017 12:18 PM EST)    RIGHT/LEFT HEART CATH AND CORONARY ANGIOGRAPHY   Hematoma at access site Documented by DLeonie Man MD 01/06/2017 12:09 PM EST  Time Range: Intra-procedure  Comment: reduced with manual pressure      Coronary Findings   Dominance: Right  Left Main  Vessel is large.  Left Anterior Descending  Vessel is large. Vessel is angiographically normal.  First Diagonal Branch  Vessel is small in size. Tiny vessel  First Septal Branch  Vessel is moderate in size.  Second Diagonal Branch  Vessel is small in size.  Second Septal Branch  Vessel is small in size.  Third Septal Branch  Vessel is small in size.  Ramus Intermedius  Vessel is large. Tapers to moderate after it gives off a small branch Vessel is angiographically normal. The vessel is moderately tortuous.  Lateral Ramus Intermedius  Vessel is small in  size. Vessel is angiographically normal. The vessel  is tortuous.  Left Circumflex  Vessel is angiographically normal.  First Obtuse Marginal Branch  Vessel is small in size. Very tiny vessel  Second Obtuse Marginal Branch  Vessel is moderate in size.  Lateral Second Obtuse Marginal Branch  Vessel is small in size.  Right Coronary Artery  Vessel is large. Vessel is angiographically normal. Somewhat high anterior  Acute Marginal Branch  Vessel is small in size.  Right Posterior Descending Artery  Vessel is moderate in size. Vessel is angiographically normal. The vessel is tortuous.  Inferior Septal  Vessel is small in size.  Right Posterior Atrioventricular Branch  Vessel is large in size. Vessel is angiographically normal. The vessel is tortuous. Very tortuous after the bifurcation  First Right Posterolateral  Vessel is small in size.  Second Right Posterolateral  Vessel is small in size.  Third Right Posterolateral  Vessel is small in size.  Right Heart   Right Heart Pressures Hemodynamic findings consistent with mild pulmonary hypertension. PAP/mean: 58/22/33 mmHg Unable to obtain wedge pressure Elevated LV EDP consistent with volume overload. LVP/EDP: 135/9/20 mmHg AOP/MAP: 134/55/83 mmHg Secondary pulmonary hypertension with pulmonary venous congestion PA saturation: 38%, AO saturation 95%. Cardiac output by Fick: 3.92, cardiac index 2.04    Right Atrium Right atrial pressure is elevated. RAP/mean: 18/17/13 mmHg    Right Ventricle RVP/RVEDP: 60/4/15 mmHg    Wall Motion   LV gram not performed        Left Heart   Left Ventricle Reportedly 30-35% by echo LV end diastolic pressure is moderately elevated. Roughly 20 mmHg    Aortic Valve There is no aortic valve stenosis. The aortic valve is calcified.    Coronary Diagrams   Diagnostic Diagram      Hemo Data   Flowsheet Row Most Recent Value  Fick Cardiac Output 3.92 L/min  Fick Cardiac Output Index 2.04  (L/min)/BSA  RA A Wave 18 mmHg  RA V Wave 17 mmHg  RA Mean 12 mmHg  RV Systolic Pressure 60 mmHg  RV Diastolic Pressure 4 mmHg  RV EDP 15 mmHg  PA Systolic Pressure 61 mmHg  PA Diastolic Pressure 15 mmHg  PA Mean 35 mmHg  AO Systolic Pressure 932 mmHg  AO Diastolic Pressure 60 mmHg  AO Mean 86 mmHg  LV Systolic Pressure 671 mmHg  LV Diastolic Pressure 7 mmHg  LV EDP 19 mmHg  Arterial Occlusion Pressure Extended Systolic Pressure 245 mmHg  Arterial Occlusion Pressure Extended Diastolic Pressure 55 mmHg  Arterial Occlusion Pressure Extended Mean Pressure 83 mmHg  Left Ventricular Apex Extended Systolic Pressure 809 mmHg  Left Ventricular Apex Extended Diastolic Pressure 9 mmHg  Left Ventricular Apex Extended EDP Pressure 20 mmHg  QP/QS 1  TPVR Index 16.16 HRUI  TSVR Index 42.11 HRUI  TPVR/TSVR Ratio 0.38     Patient Profile     83 y.o. female with history of COPD, apparent old left bundle branch block, and reported history of "3 heart attacks" in the past. States that she followed with a physician in [place] for prior cardiac events, does not indicate any prior cardiac catheterization or PCI. She presents with worsening shortness of breath over the last week, with coughing. Chest x-ray is concerning for possible associated pneumonia versus asymmetric edema. She is on broad-spectrum antibiotics with additional pulmonary treatment per primary team. Cardiac enzymes suggest NSTEMI.    Assessment & Plan    1. Demand ischemia : Troponin I up to 4.9. No active chest pain. ECG shows left bundle  branch block which is reportedly old. No old tracing for comparison.LV systolic function  mildly reduced estimate EF of 45% with some septal hypokinesis ? Due to LBBB. There is marked concentric LVH. Biatrial enlargement. Moderatepulmonary HTN. No significant CAD by cath.   Will continue ASA, statin,  selective beta blocker- bisoprolol. Not a candidate for ACEi/ARB due to acute on CKD. Will  reduce bisoprolol dose due to bradycardia.  2. COPD:  with exacerbation and possible associated community acquired pneumonia. Presented with hypoxic/hypercapnic respiratory failure, transiently on BiPAP- now on [place]. Sats improved. Management per primary team.   3. 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 May consider MRI at some point if renal function improves. She has an excellent response to diuretics with I/O negative 7 liters. Weight is down 8 lbs from admit. Start lasix 40 mg daily. Renal function is stable post cath.  No ACE-I/ARB in the setting of renal insufficiency.   4. Hypokalemia, repleted.  5. Type 2 diabetes mellitus, on Januvia as an outpatient.  6. Hyperlipidemia, on Pravachol as an outpatient.  7. Hypertension with hypertensive heart disease- marked LVH, on Cozaar as an outpatient on hold for now due to ARF. May eventually resume once renal function stable. Now on low dose bisoprolol.   8. SVT- brief run- monitor on bisoprolol. No recurrence  Patient is doing much better. Stable from our standpoint. Discharge plans per primary team. Will need follow up in our office post discharge.   Signed,  Peter Martinique, [place] 01/07/2017 11:21 AM

## 2017-01-08 DIAGNOSIS — I110 Hypertensive heart disease with heart failure: Secondary | ICD-10-CM | POA: Diagnosis not present

## 2017-01-08 DIAGNOSIS — J9600 Acute respiratory failure, unspecified whether with hypoxia or hypercapnia: Secondary | ICD-10-CM | POA: Diagnosis not present

## 2017-01-08 DIAGNOSIS — I447 Left bundle-branch block, unspecified: Secondary | ICD-10-CM | POA: Diagnosis not present

## 2017-01-08 DIAGNOSIS — E876 Hypokalemia: Secondary | ICD-10-CM | POA: Diagnosis not present

## 2017-01-08 DIAGNOSIS — R531 Weakness: Secondary | ICD-10-CM | POA: Diagnosis not present

## 2017-01-08 DIAGNOSIS — I429 Cardiomyopathy, unspecified: Secondary | ICD-10-CM | POA: Diagnosis not present

## 2017-01-08 DIAGNOSIS — I425 Other restrictive cardiomyopathy: Secondary | ICD-10-CM | POA: Diagnosis not present

## 2017-01-08 DIAGNOSIS — J918 Pleural effusion in other conditions classified elsewhere: Secondary | ICD-10-CM | POA: Diagnosis not present

## 2017-01-08 DIAGNOSIS — J441 Chronic obstructive pulmonary disease with (acute) exacerbation: Secondary | ICD-10-CM | POA: Diagnosis not present

## 2017-01-08 DIAGNOSIS — I214 Non-ST elevation (NSTEMI) myocardial infarction: Secondary | ICD-10-CM | POA: Diagnosis not present

## 2017-01-08 DIAGNOSIS — N179 Acute kidney failure, unspecified: Secondary | ICD-10-CM | POA: Diagnosis not present

## 2017-01-08 DIAGNOSIS — I420 Dilated cardiomyopathy: Secondary | ICD-10-CM | POA: Diagnosis not present

## 2017-01-08 DIAGNOSIS — R488 Other symbolic dysfunctions: Secondary | ICD-10-CM | POA: Diagnosis not present

## 2017-01-08 DIAGNOSIS — J9602 Acute respiratory failure with hypercapnia: Secondary | ICD-10-CM | POA: Diagnosis not present

## 2017-01-08 DIAGNOSIS — N183 Chronic kidney disease, stage 3 (moderate): Secondary | ICD-10-CM | POA: Diagnosis not present

## 2017-01-08 DIAGNOSIS — D509 Iron deficiency anemia, unspecified: Secondary | ICD-10-CM | POA: Diagnosis not present

## 2017-01-08 DIAGNOSIS — G4733 Obstructive sleep apnea (adult) (pediatric): Secondary | ICD-10-CM | POA: Diagnosis not present

## 2017-01-08 DIAGNOSIS — M6281 Muscle weakness (generalized): Secondary | ICD-10-CM | POA: Diagnosis not present

## 2017-01-08 DIAGNOSIS — I1310 Hypertensive heart and chronic kidney disease without heart failure, with stage 1 through stage 4 chronic kidney disease, or unspecified chronic kidney disease: Secondary | ICD-10-CM | POA: Diagnosis not present

## 2017-01-08 DIAGNOSIS — E1122 Type 2 diabetes mellitus with diabetic chronic kidney disease: Secondary | ICD-10-CM | POA: Diagnosis not present

## 2017-01-08 LAB — GLUCOSE, CAPILLARY: Glucose-Capillary: 121 mg/dL — ABNORMAL HIGH (ref 65–99)

## 2017-01-08 LAB — CBC
HCT: 27.3 % — ABNORMAL LOW (ref 36.0–46.0)
HEMOGLOBIN: 8.8 g/dL — AB (ref 12.0–15.0)
MCH: 29.3 pg (ref 26.0–34.0)
MCHC: 32.2 g/dL (ref 30.0–36.0)
MCV: 91 fL (ref 78.0–100.0)
Platelets: 225 10*3/uL (ref 150–400)
RBC: 3 MIL/uL — ABNORMAL LOW (ref 3.87–5.11)
RDW: 16 % — AB (ref 11.5–15.5)
WBC: 10.8 10*3/uL — ABNORMAL HIGH (ref 4.0–10.5)

## 2017-01-08 NOTE — Clinical Social Work Placement (Signed)
   CLINICAL SOCIAL WORK PLACEMENT  NOTE  Date:  01/08/2017  Patient Details  Name: Kelly [name] MRN: [id number] Date of Birth: [birth year]  Clinical Social Work is seeking post-discharge placement for this patient at the Skilled  Nursing Facility level of care (*CSW will initial, date and re-position this form in  chart as items are completed):  Yes   Patient/family provided with [facility] Clinical Social Work Department's list of facilities offering this level of care within the geographic area requested by the patient (or if unable, by the patient's family).  Yes   Patient/family informed of their freedom to choose among providers that offer the needed level of care, that participate in Medicare, Medicaid or managed care program needed by the patient, have an available bed and are willing to accept the patient.  Yes   Patient/family informed of [facility]'s ownership interest in [facility] and [facility], as well as of the fact that they are under no obligation to receive care at these facilities.  PASRR submitted to EDS on 01/04/17     PASRR number received on 01/04/17     Existing PASRR number confirmed on       FL2 transmitted to all facilities in geographic area requested by pt/family on 01/04/17     FL2 transmitted to all facilities within larger geographic area on       Patient informed that his/her managed care company has contracts with or will negotiate with certain facilities, including the following:        Yes   Patient/family informed of bed offers received.  Patient chooses bed at  [facility])     Physician recommends and patient chooses bed at      Patient to be transferred to  [facility]) on 01/08/17.  Patient to be transferred to facility by  [facility])     Patient family notified on 01/08/17 of transfer.  Name of family member notified:  Son [facility]     PHYSICIAN       Additional Comment:     _______________________________________________ Norlene Duel, LCSWA 01/08/2017, 9:27 AM

## 2017-01-08 NOTE — Clinical Social Work Note (Signed)
Clinical Social Worker facilitated patient discharge including contacting patient family (Son Renata Caprice) and facility [facility]) to confirm patient discharge plans. Clinical information faxed to facility and family agreeable with plan. CSW arranged ambulance transport via [facility] to. RN to call report prior to discharge.  Clinical Social Worker will sign off for now as social work intervention is no longer needed. Please consult [place] again if new need arises.  Mecca Barga [facility] Clinical Social Work Dept Weekend Social Worker [phone] 9:25 AM

## 2017-01-08 NOTE — Progress Notes (Signed)
[facility] here for patient to DC to SNF.  Report called to RN at [facility].  Patients VSS, no complaints of pain, sob or other.  Patient DC'd from unit.

## 2017-01-08 NOTE — Progress Notes (Signed)
[facility] PROGRESS NOTE  Sharayah Renfrow VFI:[id number] DOB: 11/13/1931 DOA: 01/01/2017 PCP: No primary care provider on file.  Assessment/Plan: Acute respiratory failure with hypoxia and hypercapnia:  COPD exacerbation and acute pulmonary edema. Improved withLasix.  pleated empiric antibiotics.  was discharged on oral prednisone for 5 more days. Continue Advair and Ellipta. Prn albuterol inhaler.     COPD with acute exacerbation (HCC):  As outlined above. Continue oral prednisone, with oxygen as tolerated. When necessary nebs and antitussives. NO NEED FOR O2.  NSTEMI (non-ST elevated myocardial infarction): Echo showed EF 35-40% with grade 2 diastolic dysfunction.Elevated PA pressure was (62 mmhg), troponin peaked at 4.91. Cardiac cath showing clean coronaries. Shows moderately elevated LV end diastolic pressure and mean pulmonary pressures. Significant LVH on echo suggestive of restrictive physiology. Suspect demand ischemia given clean coronaries. Continue ASA, statin, bisoprolol.   D/C TO SNF   HPI/Subjective: No overnight issues. Stable on RA  Objective: Vitals:   01/07/17 2202 01/08/17 0638  BP: (!) 121/52 (!) 136/59  Pulse: (!) 51 (!) 50  Resp: 18 18  Temp: 97.5 F (36.4 C) 97.7 F (36.5 C)    Intake/Output Summary (Last 24 hours) at 01/08/17 0852 Last data filed at 01/08/17 0645  Gross per 24 hour  Intake              843 ml  Output             2200 ml  Net            -1357 ml   Filed Weights   01/06/17 0438 01/07/17 0453 01/08/17 4166  Weight: 83.1 kg (183 lb 3.2 oz) 80.3 kg (177 lb 1.6 oz) 79.7 kg (175 lb 11.2 oz)    Exam:  Gen: not in distress HEENT: moist mucosa, supple neck Chest: clear b/l, no added sounds CVS: N S1&S2, no murmurs GI: soft, NT, ND,  Musculoskeletal: warm, no edema, right radial Cath site is clean.    Data Reviewed: Basic Metabolic Panel:  Recent Labs Lab 01/01/17 1609 01/02/17 0226 01/03/17 0246  01/04/17 0251 01/05/17 0543 01/06/17 0356 01/06/17 1244 01/07/17 0405  NA  --  142 142 139 141 141  --  139  K  --  2.9* 3.6 3.2* 4.3 4.1  --  4.1  CL  --  104 105 101 102 104  --  105  CO2  --  27 24 28 26 27   --  27  GLUCOSE  --  152* 159* 129* 146* 156*  --  122*  BUN  --  30* 45* 63* 69* 65*  --  54*  CREATININE  --  1.39* 1.61* 1.75* 1.50* 1.31* 1.28* 1.29*  CALCIUM  --  8.9 9.3 8.6* 9.0 8.9  --  8.8*  MG  --  2.1  --   --   --   --   --   --   PHOS 4.5  --   --   --   --   --   --   --    Liver Function Tests:  Recent Labs Lab 01/01/17 1046  AST 44*  ALT 57*  ALKPHOS 82  BILITOT 1.1  PROT 8.2*  ALBUMIN 3.9   No results for input(s): LIPASE, AMYLASE in the last 168 hours. No results for input(s): AMMONIA in the last 168 hours. CBC:  Recent Labs Lab 01/01/17 1046  01/04/17 0251 01/05/17 0543 01/06/17 0356 01/06/17 1244 01/08/17 0422  WBC 20.8*  < > 21.2*  18.5* 15.7* 15.7* 10.8*  NEUTROABS 9.5*  --   --   --   --   --   --   HGB 10.4*  < > 8.1* 8.4* 8.5* 9.1* 8.8*  HCT 33.6*  < > 24.9* 26.3* 26.7* 28.7* 27.3*  MCV 94.1  < > 89.2 90.4 91.1 91.4 91.0  PLT 232  < > 169 196 221 219 225  < > = values in this interval not displayed. Cardiac Enzymes:  Recent Labs Lab 01/01/17 1609 01/01/17 2029 01/02/17 0226 01/02/17 1132  TROPONINI 3.26* 3.75* 3.91* 4.91*   BNP (last 3 results)  Recent Labs  01/01/17 1046 01/03/17 0906  BNP 658.7* 458.4*    ProBNP (last 3 results) No results for input(s): PROBNP in the last 8760 hours.  CBG:  Recent Labs Lab 01/07/17 0547 01/07/17 1147 01/07/17 1652 01/07/17 2201 01/08/17 0636  GLUCAP 105* 135* 234* 131* 121*    Recent Results (from the past 240 hour(s))  Respiratory Panel by PCR     Status: None   Collection Time: 01/01/17  2:40 PM  Result Value Ref Range Status   Adenovirus NOT DETECTED NOT DETECTED Final   Coronavirus 229E NOT DETECTED NOT DETECTED Final   Coronavirus HKU1 NOT DETECTED NOT  DETECTED Final   Coronavirus NL63 NOT DETECTED NOT DETECTED Final   Coronavirus OC43 NOT DETECTED NOT DETECTED Final   Metapneumovirus NOT DETECTED NOT DETECTED Final   Rhinovirus / Enterovirus NOT DETECTED NOT DETECTED Final   Influenza A NOT DETECTED NOT DETECTED Final   Influenza B NOT DETECTED NOT DETECTED Final   Parainfluenza Virus 1 NOT DETECTED NOT DETECTED Final   Parainfluenza Virus 2 NOT DETECTED NOT DETECTED Final   Parainfluenza Virus 3 NOT DETECTED NOT DETECTED Final   Parainfluenza Virus 4 NOT DETECTED NOT DETECTED Final   Respiratory Syncytial Virus NOT DETECTED NOT DETECTED Final   Bordetella pertussis NOT DETECTED NOT DETECTED Final   Chlamydophila pneumoniae NOT DETECTED NOT DETECTED Final   Mycoplasma pneumoniae NOT DETECTED NOT DETECTED Final  MRSA PCR Screening     Status: None   Collection Time: 01/01/17  6:01 PM  Result Value Ref Range Status   MRSA by PCR NEGATIVE NEGATIVE Final    Comment:        The GeneXpert MRSA Assay (FDA approved for NASAL specimens only), is one component of a comprehensive MRSA colonization surveillance program. It is not intended to diagnose MRSA infection nor to guide or monitor treatment for MRSA infections.      Studies: No results found.  Scheduled Meds: . aspirin EC  81 mg Oral Daily  . azithromycin  500 mg Intravenous Q24H  . bisoprolol  5 mg Oral Daily  . budesonide (PULMICORT) nebulizer solution  0.25 mg Nebulization BID  . ferrous sulfate  325 mg Oral TID WC  . furosemide  40 mg Oral Daily  . guaiFENesin  600 mg Oral BID  . heparin  5,000 Units Subcutaneous Q8H  . insulin aspart  0-20 Units Subcutaneous TID WC  . ipratropium-albuterol  3 mL Nebulization BID  . mouth rinse  15 mL Mouth Rinse BID  . pravastatin  40 mg Oral q1800  . predniSONE  50 mg Oral Q breakfast  . sodium chloride flush  3 mL Intravenous Q12H  . sodium chloride flush  3 mL Intravenous Q12H  . vitamin B-12  1,000 mcg Oral Daily    Continuous Infusions: . albuterol 20 mg/hr (01/01/17 1142)    Principal  Problem:   Acute respiratory failure with hypercapnia (HCC) Active Problems:   Congestive dilated cardiomyopathy (HCC)   NSTEMI (non-ST elevated myocardial infarction) (HCC)   COPD with acute exacerbation (HCC)   LBBB (left bundle branch block)   Troponin I above reference range   Hypertensive heart disease   Nonischemic congestive cardiomyopathy (HCC)   Acute renal failure superimposed on stage 3 chronic kidney disease (HCC)    Time spent: 25    Eddie North  [facility] Pager [phone] If 7PM-7AM, please contact night-coverage at [email or web address], password [facility] 01/08/2017, 8:52 AM  LOS: 6 days

## 2017-01-11 ENCOUNTER — Encounter: Payer: Self-pay | Admitting: Internal Medicine

## 2017-01-11 ENCOUNTER — Non-Acute Institutional Stay (SKILLED_NURSING_FACILITY): Payer: Medicare Other | Admitting: Internal Medicine

## 2017-01-11 DIAGNOSIS — G4733 Obstructive sleep apnea (adult) (pediatric): Secondary | ICD-10-CM | POA: Diagnosis not present

## 2017-01-11 DIAGNOSIS — I214 Non-ST elevation (NSTEMI) myocardial infarction: Secondary | ICD-10-CM | POA: Diagnosis not present

## 2017-01-11 DIAGNOSIS — J9602 Acute respiratory failure with hypercapnia: Secondary | ICD-10-CM

## 2017-01-11 DIAGNOSIS — G4730 Sleep apnea, unspecified: Secondary | ICD-10-CM | POA: Insufficient documentation

## 2017-01-11 NOTE — Progress Notes (Signed)
Facility Location: [facility]  Room Number: 307  Code Status:    PCP: Dr Adair Patter, [place]. The patient lives in [place], [place] but was visiting her son in [place] when she experienced the acute cardiopulmonary issues.  This is a comprehensive admission note to [facility] performed on this date less than 30 days from date of admission. Included are preadmission medical/surgical history;reconciled medication list; family history; social history and comprehensive review of systems.  Corrections and additions to the records were documented . Comprehensive physical exam was also performed. Additionally a clinical summary was entered for each active diagnosis pertinent to this admission in the Problem List to enhance continuity of care.   HPI: Patient was hospitalized 1/20-1/27/18 for acute respiratory failure with hypercapnia in the context of bronchitic type process .She suffered a non-STEMI with constrictive restrictive cardiomyopathy & acute pulmonary edema. Troponin peaked at 4.91. Cath revealed no significant coronary artery lesions She expressed acute renal failure superimposed on stage III chronic kidney disease. Cardiologic evaluation documented L bundle branch block and ejection fraction of 35-40 percent with severe left ventricular hypertrophy and grade 2 diastolic dysfunction. Surprisingly at discharge there was no need for O2 based on room air oxygen saturations. Pre-discharge labs revealed a normochromic, normocytic anemia with hemoglobin 8.8 and hematocrit 27.3. Creatinine was 1.29 and GFR 43. Hemoglobin A1c was nondiabetic at 5.5% on 01/01/17.  Past medical and surgical history: Includes COPD, chronic kidney disease, and sleep apnea . She has used her CPAP since 2013. She had a negative colonoscopy in 2008.  Social history: NEVER smoker  Family history: None in Epic ; updated  Review of systems: Her only active  complaints are exertional dyspnea in PT and fatigue. She's had some chronic intermittent numbness and tingling in her feet. She also has intermittent knee pain. She describes intermittent itchy eyes. She states that her fasting glucoses at home were in the 110 range.  Constitutional: No fever,significant weight change  Eyes: No redness, discharge, pain, vision change ENT/mouth: No nasal congestion,  purulent discharge, earache,change in hearing ,sore throat  Cardiovascular: No chest pain, palpitations,paroxysmal nocturnal dyspnea, claudication, edema  Respiratory: No cough, sputum production,hemoptysis,  significant snoring,apnea  Gastrointestinal: No heartburn,dysphagia,abdominal pain, nausea / vomiting,rectal bleeding, melena,change in bowels Genitourinary: No dysuria,hematuria, pyuria,  incontinence, nocturia Dermatologic: No rash, pruritus, change in appearance of skin Neurologic: No dizziness,headache,syncope, seizures Psychiatric: No significant anxiety , depression, insomnia, anorexia Endocrine: No change in hair/skin/ nails, excessive thirst, excessive hunger, excessive urination  Hematologic/lymphatic: No significant bruising, lymphadenopathy,abnormal bleeding Allergy/immunology: No  significant sneezing, urticaria, angioedema  Physical exam:  Pertinent or positive findings: Arcus senilis is present. She is edentulous. She wears only the lower plate. A flow murmur is present at the base. She has trace edema @ sock line. Pedal pulses are decreased. She exhibits slight asymmetric weakness in extremities. She has slight clubbing of the nailbeds.  General appearance:Adequately nourished; no acute distress , increased work of breathing is present.   Lymphatic: No lymphadenopathy about the head, neck, axilla . Eyes: No conjunctival inflammation or lid edema is present. There is no scleral icterus. Ears:  External ear exam shows no significant lesions or deformities.   Nose:  External nasal  examination shows no deformity or inflammation. Nasal mucosa are pink and moist without lesions ,exudates Oral exam: lips and gums are healthy appearing.There is no oropharyngeal erythema or exudate . Neck:  No thyromegaly, masses, tenderness noted.    Heart:  Normal  rate and regular rhythm. S1 and S2 normal without gallop, click, rub .  Lungs:Chest clear to auscultation without wheezes, rhonchi,rales , rubs. Abdomen:Bowel sounds are normal. Abdomen is soft and nontender with no organomegaly, hernias,masses. GU: deferred . Extremities:  No cyanosis  Neurologic exam : Balance,Rhomberg,finger to nose testing could not be completed due to clinical state Deep tendon reflexes are equal Skin: Warm & dry w/o tenting. No significant lesions or rash.  See clinical summary under each active problem in the Problem List with associated updated therapeutic plan

## 2017-01-11 NOTE — Assessment & Plan Note (Signed)
Son encouraged to bring in her CPAP machine

## 2017-01-11 NOTE — Assessment & Plan Note (Signed)
To complete short course of oral steroids

## 2017-01-11 NOTE — Patient Instructions (Signed)
See assessment and plan under each diagnosis in the problem list and acutely for this visit 

## 2017-01-11 NOTE — Assessment & Plan Note (Signed)
Her son was unaware that she had a NSTEMI.  I encouraged him to discuss this with Dr. [place] cardiologist

## 2017-01-25 ENCOUNTER — Encounter: Payer: Self-pay | Admitting: Nurse Practitioner

## 2017-01-25 ENCOUNTER — Non-Acute Institutional Stay (SKILLED_NURSING_FACILITY): Payer: Medicare Other | Admitting: Nurse Practitioner

## 2017-01-25 DIAGNOSIS — I110 Hypertensive heart disease with heart failure: Secondary | ICD-10-CM | POA: Diagnosis not present

## 2017-01-25 LAB — BASIC METABOLIC PANEL
BUN: 29 mg/dL — AB (ref 4–21)
Creatinine: 1.2 mg/dL — AB (ref 0.5–1.1)
GLUCOSE: 95 mg/dL
POTASSIUM: 3.6 mmol/L (ref 3.4–5.3)
Sodium: 148 mmol/L — AB (ref 137–147)

## 2017-01-25 NOTE — Progress Notes (Addendum)
Nursing Home Location: [facility] Room: 307 A   Place of Service: SNF (31)  PCP: No primary care provider on file.  No Known Allergies  Chief Complaint  Patient presents with  . Acute Visit    HPI:  Patient is a 83 y.o. female seen today at [facility] for evaluation for discharge home. Pt with hx of COPD, chronic kidney disease, and sleep apnea. Pt was visiting her son (she lives in [place]) when she had acute cardiopulmonary issues and was hospitalized 1/20-1/27/18 for acute respiratory failure with hypercapnia in the context of bronchitic type process .She suffered a non-STEMI with constrictive restrictive cardiomyopathy & acute pulmonary edema. Troponin peaked at 4.91. Cath revealed no significant coronary artery lesions. Cardiologic evaluation documented L bundle branch block and ejection fraction of 35-40 percent with severe left ventricular hypertrophy and grade 2 diastolic dysfunction.  Pt reports she is more short of breath today with weight gain noted.  She has had more swelling to LE.  Plan is to discharge home later this week.   Review of Systems:  Review of Systems  Constitutional: Positive for activity change and fatigue. Negative for appetite change and unexpected weight change.  HENT: Negative for congestion and hearing loss.   Eyes: Negative.   Respiratory: Positive for shortness of breath. Negative for cough.   Cardiovascular: Positive for leg swelling. Negative for chest pain and palpitations.  Gastrointestinal: Negative for abdominal pain, constipation and diarrhea.  Genitourinary: Negative for difficulty urinating and dysuria.  Musculoskeletal: Negative for arthralgias and myalgias.  Skin: Negative for color change and wound.  Neurological: Positive for weakness. Negative for dizziness.  Psychiatric/Behavioral: Negative for agitation, behavioral problems and confusion.    Past Medical History:  Diagnosis Date  . CHF (congestive  heart failure) (HCC)   . CKD (chronic kidney disease)   . COPD (chronic obstructive pulmonary disease) (HCC)   . LBBB (left bundle branch block)    Past Surgical History:  Procedure Laterality Date  . CARDIAC CATHETERIZATION N/A 01/06/2017   Procedure: Right/Left Heart Cath and Coronary Angiography;  Surgeon: Marykay Lex, MD;  Location: [facility] INVASIVE CV LAB;  Service: Cardiovascular;  Laterality: N/A;  . COLONOSCOPY  2008   Negative   Social History:   reports that she has quit smoking. She has never used smokeless tobacco. She reports that she does not drink alcohol or use drugs.  Family History  Problem Relation Age of Onset  . Hypertension Father   . Stroke Father   . COPD Neg Hx   . Heart disease Neg Hx   . Diabetes Neg Hx     Medications: Patient's Medications  New Prescriptions   No medications on file  Previous Medications   ALBUTEROL (PROVENTIL HFA;VENTOLIN HFA) 108 (90 BASE) MCG/ACT INHALER    Inhale 2 puffs into the lungs every 6 (six) hours as needed for wheezing or shortness of breath.   ASPIRIN 325 MG TABLET    Take 325 mg by mouth daily.   BISOPROLOL (ZEBETA) 5 MG TABLET    Take 1 tablet (5 mg total) by mouth daily.   CHOLECALCIFEROL (VITAMIN D3) 1000 UNITS CAPS    Take 1,000 Units by mouth daily.   FERROUS SULFATE 325 (65 FE) MG EC TABLET    Take 325 mg by mouth 3 (three) times daily with meals.   FLUTICASONE-SALMETEROL (ADVAIR) 500-50 MCG/DOSE AEPB    Inhale 1 puff into the lungs 2 (two) times daily.   FUROSEMIDE (LASIX)  20 MG TABLET    Take 2 tablets (40 mg total) by mouth daily.   IPRATROPIUM-ALBUTEROL (DUONEB) 0.5-2.5 (3) MG/3ML SOLN    Take 3 mLs by nebulization every 6 (six) hours as needed (wheezing and shortness of breath).   PRAVASTATIN (PRAVACHOL) 40 MG TABLET    Take 40 mg by mouth daily.   UMECLIDINIUM BROMIDE (INCRUSE ELLIPTA) 62.5 MCG/INH AEPB    Inhale 1 puff into the lungs daily.   VITAMIN B-12 (CYANOCOBALAMIN) 1000 MCG TABLET    Take 1,000 mcg  by mouth daily.  Modified Medications   No medications on file  Discontinued Medications   SITAGLIPTIN (JANUVIA) 25 MG TABLET    Take 25 mg by mouth daily.     Physical Exam: Vitals:   01/25/17 1053  BP: 125/66  Pulse: 60  Resp: (!) 24  Temp: 97.5 F (36.4 C)  SpO2: 100%  Weight: 174 lb 3.2 oz (79 kg)  Height: 5\' 7"  (1.702 m)    Physical Exam  Constitutional: She is oriented to person, place, and time. She appears well-developed and well-nourished. No distress.  HENT:  Head: Normocephalic and atraumatic.  Mouth/Throat: Oropharynx is clear and moist. No oropharyngeal exudate.  Eyes: Conjunctivae are normal. Pupils are equal, round, and reactive to light.  Neck: Normal range of motion. Neck supple. JVD present.  Cardiovascular: Normal rate, regular rhythm and normal heart sounds.   Pulmonary/Chest: Effort normal.  Diminished   Abdominal: Soft. Bowel sounds are normal.  Musculoskeletal: She exhibits edema (1+ bilaterally). She exhibits no tenderness.  Neurological: She is alert and oriented to person, place, and time.  Skin: Skin is warm and dry. She is not diaphoretic.  Psychiatric: She has a normal mood and affect.    Labs reviewed: Basic Metabolic Panel:  Recent Labs  75/17/00 1609 01/02/17 0226  01/05/17 0543 01/06/17 0356 01/06/17 1244 01/07/17 0405  NA  --  142  < > 141 141  --  139  K  --  2.9*  < > 4.3 4.1  --  4.1  CL  --  104  < > 102 104  --  105  CO2  --  27  < > 26 27  --  27  GLUCOSE  --  152*  < > 146* 156*  --  122*  BUN  --  30*  < > 69* 65*  --  54*  CREATININE  --  1.39*  < > 1.50* 1.31* 1.28* 1.29*  CALCIUM  --  8.9  < > 9.0 8.9  --  8.8*  MG  --  2.1  --   --   --   --   --   PHOS 4.5  --   --   --   --   --   --   < > = values in this interval not displayed. Liver Function Tests:  Recent Labs  01/01/17 1046  AST 44*  ALT 57*  ALKPHOS 82  BILITOT 1.1  PROT 8.2*  ALBUMIN 3.9   No results for input(s): LIPASE, AMYLASE in the last  8760 hours. No results for input(s): AMMONIA in the last 8760 hours. CBC:  Recent Labs  01/01/17 1046  01/06/17 0356 01/06/17 1244 01/08/17 0422  WBC 20.8*  < > 15.7* 15.7* 10.8*  NEUTROABS 9.5*  --   --   --   --   HGB 10.4*  < > 8.5* 9.1* 8.8*  HCT 33.6*  < > 26.7* 28.7* 27.3*  MCV 94.1  < > 91.1 91.4 91.0  PLT 232  < > 221 219 225  < > = values in this interval not displayed. TSH: No results for input(s): TSH in the last 8760 hours. A1C: Lab Results  Component Value Date   HGBA1C 5.5 01/01/2017   Lipid Panel: No results for input(s): CHOL, HDL, LDLCALC, TRIG, CHOLHDL, LDLDIRECT in the last 8760 hours.  Radiological Exams: Dg Chest 1 View  Result Date: 01/02/2017 CLINICAL DATA:  Shortness of breath. EXAM: CHEST 1 VIEW COMPARISON:  January 02, 2016 FINDINGS: Stable cardiomediastinal silhouette and cardiomegaly. Bilateral pulmonary infiltrates are identified, similar in the interval. No other interval changes. IMPRESSION: Cardiomegaly. Diffuse bilateral pulmonary opacities persist, unchanged in the interval. Electronically Signed   By: Gerome Sam III M.D   On: 01/02/2017 07:20   Dg Chest Port 1 View  Result Date: 01/01/2017 CLINICAL DATA:  Respiratory distress. EXAM: PORTABLE CHEST 1 VIEW COMPARISON:  None. FINDINGS: Cardiomegaly. Bilateral pulmonary opacities, right greater than left. No pneumothorax. No other acute abnormalities. IMPRESSION: Diffuse bilateral pulmonary opacities, right greater than left. Opacity is more confluent and patchy in the right base. The findings could represent asymmetric edema but diffuse infection is not completely excluded. Recommend clinical correlation and follow-up. Electronically Signed   By: Gerome Sam III M.D   On: 01/01/2017 10:54   Dg Abd Portable 1v  Result Date: 01/01/2017 CLINICAL DATA:  Shortness of breath and abdominal distention. EXAM: PORTABLE ABDOMEN - 1 VIEW COMPARISON:  None FINDINGS: Mild gaseous distension of the  small and large bowel loops. Gas and stool noted throughout the colon up level the rectum. IMPRESSION: Nonobstructive bowel gas pattern. Electronically Signed   By: Signa Kell M.D.   On: 01/01/2017 14:52   Assessment/Plan 1. Hypertensive heart disease with heart failure (HCC) Weight trending up since hospitalization with worsening edema, JVD and shortness of breath.  -blood pressure remains stable but CHF worsening  -Will change lasix to Demadex 20 mg daily. - Staff to give zaroxolyn 2.5 mg PO x 1 today.  Cont daily weight BMP today -2 gm sodium diet -2L fluid restriction Will not discharge until further evaluation in the week  Myah Guynes K. Biagio Borg  [facility] [phone] 8 am - 5 pm) [phone] (after hours)

## 2017-01-27 ENCOUNTER — Non-Acute Institutional Stay (SKILLED_NURSING_FACILITY): Payer: Medicare Other | Admitting: Nurse Practitioner

## 2017-01-27 ENCOUNTER — Encounter: Payer: Self-pay | Admitting: Nurse Practitioner

## 2017-01-27 DIAGNOSIS — I429 Cardiomyopathy, unspecified: Secondary | ICD-10-CM

## 2017-01-27 DIAGNOSIS — I420 Dilated cardiomyopathy: Secondary | ICD-10-CM

## 2017-01-27 DIAGNOSIS — N183 Chronic kidney disease, stage 3 (moderate): Secondary | ICD-10-CM

## 2017-01-27 DIAGNOSIS — J441 Chronic obstructive pulmonary disease with (acute) exacerbation: Secondary | ICD-10-CM

## 2017-01-27 DIAGNOSIS — I110 Hypertensive heart disease with heart failure: Secondary | ICD-10-CM | POA: Diagnosis not present

## 2017-01-27 DIAGNOSIS — G4733 Obstructive sleep apnea (adult) (pediatric): Secondary | ICD-10-CM

## 2017-01-27 DIAGNOSIS — N179 Acute kidney failure, unspecified: Secondary | ICD-10-CM

## 2017-01-27 LAB — BASIC METABOLIC PANEL
BUN: 35 mg/dL — AB (ref 4–21)
CREATININE: 1.5 mg/dL — AB (ref 0.5–1.1)
Glucose: 104 mg/dL
POTASSIUM: 3.5 mmol/L (ref 3.4–5.3)
Sodium: 148 mmol/L — AB (ref 137–147)

## 2017-01-27 MED ORDER — POTASSIUM CHLORIDE CRYS ER 20 MEQ PO TBCR: 20.0000 meq | EXTENDED_RELEASE_TABLET | Freq: Every day | ORAL | 0 refills | Status: DC

## 2017-01-27 MED ORDER — TORSEMIDE 20 MG PO TABS: 20.0000 mg | ORAL_TABLET | Freq: Every day | ORAL | 0 refills | Status: DC

## 2017-01-27 MED ORDER — FLUTICASONE-SALMETEROL 500-50 MCG/DOSE IN AEPB: 1.0000 | INHALATION_SPRAY | Freq: Two times a day (BID) | RESPIRATORY_TRACT | 0 refills | Status: DC

## 2017-01-27 MED ORDER — IPRATROPIUM-ALBUTEROL 0.5-2.5 (3) MG/3ML IN SOLN: 3.0000 mL | Freq: Four times a day (QID) | RESPIRATORY_TRACT | 0 refills | Status: DC | PRN

## 2017-01-27 MED ORDER — PRAVASTATIN SODIUM 40 MG PO TABS: 40.0000 mg | ORAL_TABLET | Freq: Every day | ORAL | 0 refills | Status: DC

## 2017-01-27 MED ORDER — UMECLIDINIUM BROMIDE 62.5 MCG/INH IN AEPB: 1.0000 | INHALATION_SPRAY | Freq: Every day | RESPIRATORY_TRACT | 0 refills | Status: DC

## 2017-01-27 MED ORDER — ALBUTEROL SULFATE HFA 108 (90 BASE) MCG/ACT IN AERS: 2.0000 | INHALATION_SPRAY | Freq: Four times a day (QID) | RESPIRATORY_TRACT | 0 refills | Status: DC | PRN

## 2017-01-27 MED ORDER — BISOPROLOL FUMARATE 5 MG PO TABS: 5.0000 mg | ORAL_TABLET | Freq: Every day | ORAL | 0 refills | Status: DC

## 2017-01-27 NOTE — Progress Notes (Signed)
Nursing Home Location:  [facility] Room: 307 A  Place of Service: SNF (31)  PCP: No primary care provider on file.     No Known Allergies  Chief Complaint  Patient presents with  . Discharge Note    HPI:  Patient is a 83 y.o. female seen today at [facility] for discharge home. Pt with hx of COPD, chronic kidney disease, and sleep apnea. Pt was visiting her son (she lives in [place]) when she had acute cardiopulmonary issues and was hospitalized 1/20-1/27/18 for acute respiratory failure with hypercapniain the context of bronchitic type process .She suffered a non-STEMI with constrictive restrictive cardiomyopathy &acute pulmonary edema. Troponin peaked at 4.91. Cath revealed no significant coronary artery lesions. Cardiologic evaluation documented L bundle branch block and ejection fraction of 35-40 percent with severe left ventricular hypertrophy and grade 2 diastolic dysfunction. Last week lasix was changed to demadex, since then shortness of breath has improved, swelling has gone down and weight has trended down.  Potassium was added due to borderline low potassium with labs, follow up BMP due today.  Patient currently doing well with therapy, now stable to discharge home with home health.   Review of Systems:  Review of Systems  Constitutional: Negative for activity change, appetite change, fatigue and unexpected weight change.  HENT: Negative for congestion and hearing loss.   Eyes: Negative.   Respiratory: Negative for cough and shortness of breath.   Cardiovascular: Negative for chest pain, palpitations and leg swelling.  Gastrointestinal: Negative for abdominal pain, constipation and diarrhea.  Genitourinary: Negative for difficulty urinating and dysuria.  Musculoskeletal: Negative for arthralgias and myalgias.  Skin: Negative for color change and wound.  Neurological: Negative for dizziness and weakness.  Psychiatric/Behavioral: Negative for  agitation, behavioral problems and confusion.    Past Medical History:  Diagnosis Date  . CHF (congestive heart failure) (HCC)   . CKD (chronic kidney disease)   . COPD (chronic obstructive pulmonary disease) (HCC)   . LBBB (left bundle branch block)    Past Surgical History:  Procedure Laterality Date  . CARDIAC CATHETERIZATION N/A 01/06/2017   Procedure: Right/Left Heart Cath and Coronary Angiography;  Surgeon: Marykay Lex, MD;  Location: [facility] INVASIVE CV LAB;  Service: Cardiovascular;  Laterality: N/A;  . COLONOSCOPY  2008   Negative   Social History:   reports that she has quit smoking. She has never used smokeless tobacco. She reports that she does not drink alcohol or use drugs.  Family History  Problem Relation Age of Onset  . Hypertension Father   . Stroke Father   . COPD Neg Hx   . Heart disease Neg Hx   . Diabetes Neg Hx     Medications: Patient's Medications  New Prescriptions   No medications on file  Previous Medications   ALBUTEROL (PROVENTIL HFA;VENTOLIN HFA) 108 (90 BASE) MCG/ACT INHALER    Inhale 2 puffs into the lungs every 6 (six) hours as needed for wheezing or shortness of breath.   ASPIRIN 325 MG TABLET    Take 325 mg by mouth daily.   BISOPROLOL (ZEBETA) 5 MG TABLET    Take 1 tablet (5 mg total) by mouth daily.   CHOLECALCIFEROL (VITAMIN D3) 1000 UNITS CAPS    Take 1,000 Units by mouth daily.   FERROUS SULFATE 325 (65 FE) MG EC TABLET    Take 325 mg by mouth 3 (three) times daily with meals.   FLUTICASONE-SALMETEROL (ADVAIR) 500-50 MCG/DOSE AEPB  Inhale 1 puff into the lungs 2 (two) times daily.   IPRATROPIUM-ALBUTEROL (DUONEB) 0.5-2.5 (3) MG/3ML SOLN    Take 3 mLs by nebulization every 6 (six) hours as needed (wheezing and shortness of breath).   PRAVASTATIN (PRAVACHOL) 40 MG TABLET    Take 40 mg by mouth daily.   TORSEMIDE (DEMADEX) 20 MG TABLET    Take 20 mg by mouth daily.   UMECLIDINIUM BROMIDE (INCRUSE ELLIPTA) 62.5 MCG/INH AEPB    Inhale 1  puff into the lungs daily.   VITAMIN B-12 (CYANOCOBALAMIN) 1000 MCG TABLET    Take 1,000 mcg by mouth daily.  Modified Medications   No medications on file  Discontinued Medications   FUROSEMIDE (LASIX) 20 MG TABLET    Take 2 tablets (40 mg total) by mouth daily.     Physical Exam: Vitals:   01/27/17 1034  BP: 136/72  Pulse: 60  Resp: 18  Temp: 97.2 F (36.2 C)  SpO2: 96%  Weight: 177 lb 9.6 oz (80.6 kg)  Height: 5\' 7"  (1.702 m)    Physical Exam  Constitutional: She is oriented to person, place, and time. She appears well-developed and well-nourished. No distress.  HENT:  Head: Normocephalic and atraumatic.  Mouth/Throat: Oropharynx is clear and moist. No oropharyngeal exudate.  Eyes: Conjunctivae are normal. Pupils are equal, round, and reactive to light.  Neck: Normal range of motion. Neck supple. No JVD present.  Cardiovascular: Normal rate, regular rhythm and normal heart sounds.   Pulmonary/Chest: Effort normal.  Diminished   Abdominal: Soft. Bowel sounds are normal.  Musculoskeletal: She exhibits edema (trace). She exhibits no tenderness.  Neurological: She is alert and oriented to person, place, and time.  Skin: Skin is warm and dry. She is not diaphoretic.  Psychiatric: She has a normal mood and affect.    Labs reviewed: Basic Metabolic Panel:  Recent Labs  16/10/96 1609 01/02/17 0226  01/05/17 0543 01/06/17 0356 01/06/17 1244 01/07/17 0405 01/25/17  NA  --  142  < > 141 141  --  139 148*  K  --  2.9*  < > 4.3 4.1  --  4.1 3.6  CL  --  104  < > 102 104  --  105  --   CO2  --  27  < > 26 27  --  27  --   GLUCOSE  --  152*  < > 146* 156*  --  122*  --   BUN  --  30*  < > 69* 65*  --  54* 29*  CREATININE  --  1.39*  < > 1.50* 1.31* 1.28* 1.29* 1.2*  CALCIUM  --  8.9  < > 9.0 8.9  --  8.8*  --   MG  --  2.1  --   --   --   --   --   --   PHOS 4.5  --   --   --   --   --   --   --   < > = values in this interval not displayed. Liver Function  Tests:  Recent Labs  01/01/17 1046  AST 44*  ALT 57*  ALKPHOS 82  BILITOT 1.1  PROT 8.2*  ALBUMIN 3.9   No results for input(s): LIPASE, AMYLASE in the last 8760 hours. No results for input(s): AMMONIA in the last 8760 hours. CBC:  Recent Labs  01/01/17 1046  01/06/17 0356 01/06/17 1244 01/08/17 0422  WBC 20.8*  < > 15.7*  15.7* 10.8*  NEUTROABS 9.5*  --   --   --   --   HGB 10.4*  < > 8.5* 9.1* 8.8*  HCT 33.6*  < > 26.7* 28.7* 27.3*  MCV 94.1  < > 91.1 91.4 91.0  PLT 232  < > 221 219 225  < > = values in this interval not displayed. TSH: No results for input(s): TSH in the last 8760 hours. A1C: Lab Results  Component Value Date   HGBA1C 5.5 01/01/2017   Lipid Panel: No results for input(s): CHOL, HDL, LDLCALC, TRIG, CHOLHDL, LDLDIRECT in the last 8760 hours.  Radiological Exams: Dg Chest 1 View  Result Date: 01/02/2017 CLINICAL DATA:  Shortness of breath. EXAM: CHEST 1 VIEW COMPARISON:  January 02, 2016 FINDINGS: Stable cardiomediastinal silhouette and cardiomegaly. Bilateral pulmonary infiltrates are identified, similar in the interval. No other interval changes. IMPRESSION: Cardiomegaly. Diffuse bilateral pulmonary opacities persist, unchanged in the interval. Electronically Signed   By: Gerome Sam III M.D   On: 01/02/2017 07:20   Dg Chest Port 1 View  Result Date: 01/01/2017 CLINICAL DATA:  Respiratory distress. EXAM: PORTABLE CHEST 1 VIEW COMPARISON:  None. FINDINGS: Cardiomegaly. Bilateral pulmonary opacities, right greater than left. No pneumothorax. No other acute abnormalities. IMPRESSION: Diffuse bilateral pulmonary opacities, right greater than left. Opacity is more confluent and patchy in the right base. The findings could represent asymmetric edema but diffuse infection is not completely excluded. Recommend clinical correlation and follow-up. Electronically Signed   By: Gerome Sam III M.D   On: 01/01/2017 10:54   Dg Abd Portable 1v  Result Date:  01/01/2017 CLINICAL DATA:  Shortness of breath and abdominal distention. EXAM: PORTABLE ABDOMEN - 1 VIEW COMPARISON:  None FINDINGS: Mild gaseous distension of the small and large bowel loops. Gas and stool noted throughout the colon up level the rectum. IMPRESSION: Nonobstructive bowel gas pattern. Electronically Signed   By: Signa Kell M.D.   On: 01/01/2017 14:52    Assessment/Plan 1. COPD with acute exacerbation (HCC) Breathing has improved, will cont outpatient medications - albuterol (PROVENTIL HFA;VENTOLIN HFA) 108 (90 Base) MCG/ACT inhaler; Inhale 2 puffs into the lungs every 6 (six) hours as needed for wheezing or shortness of breath.  Dispense: 1 Inhaler; Refill: 0 - Fluticasone-Salmeterol (ADVAIR) 500-50 MCG/DOSE AEPB; Inhale 1 puff into the lungs 2 (two) times daily.  Dispense: 60 each; Refill: 0 - ipratropium-albuterol (DUONEB) 0.5-2.5 (3) MG/3ML SOLN; Take 3 mLs by nebulization every 6 (six) hours as needed (wheezing and shortness of breath).  Dispense: 360 mL; Refill: 0 - umeclidinium bromide (INCRUSE ELLIPTA) 62.5 MCG/INH AEPB; Inhale 1 puff into the lungs daily.  Dispense: 1 each; Refill: 0  2. Nonischemic congestive cardiomyopathy (HCC) - bisoprolol (ZEBETA) 5 MG tablet; Take 1 tablet (5 mg total) by mouth daily.  Dispense: 30 tablet; Refill: 0  3. Hypertensive heart disease with heart failure (HCC) -weight has trended down, pt reports shortness of breath and edema has improved. Will cont demadex and potassium  - bisoprolol (ZEBETA) 5 MG tablet; Take 1 tablet (5 mg total) by mouth daily.  Dispense: 30 tablet; Refill: 0 - torsemide (DEMADEX) 20 MG tablet; Take 1 tablet (20 mg total) by mouth daily.  Dispense: 30 tablet; Refill: 0 - potassium chloride SA (K-DUR,KLOR-CON) 20 MEQ tablet; Take 1 tablet (20 mEq total) by mouth daily.  Dispense: 30 tablet; Refill: 0  4. Acute renal failure superimposed on stage 3 chronic kidney disease, unspecified acute renal failure type  (HCC) -BUN/CR  has improved, will follow BUN and Cr at this time as she is needing more diuretic due to fluid retention with CHF. -BMP pending at this time.   5. Obstructive sleep apnea syndrome conts on CPAP  Pt is from [place] and has no PCP in town as she is here visiting her son. Plan is to move to retirement community in [place] close to her other son and they are looking at different places at this time. She is planning on staying in [place] until move. Will have pt establish care at [facility] to follow up since she has no PCP in town until she can get things together to move to [place].  appt made and pt aware.  Janene Harvey. Biagio Borg  [facility] [phone] 8 am - 5 pm) [phone] (after hours)

## 2017-02-01 DIAGNOSIS — I130 Hypertensive heart and chronic kidney disease with heart failure and stage 1 through stage 4 chronic kidney disease, or unspecified chronic kidney disease: Secondary | ICD-10-CM | POA: Diagnosis not present

## 2017-02-01 DIAGNOSIS — J441 Chronic obstructive pulmonary disease with (acute) exacerbation: Secondary | ICD-10-CM | POA: Diagnosis not present

## 2017-02-03 NOTE — Progress Notes (Signed)
Cardiology Office Note    Date:  02/04/2017   ID:  Kelly [name], DOB 1931-06-11, MRN [id number]  PCP:  No PCP Per Patient  Cardiologist:  Peter Swaziland, MD    History of Present Illness:  Kelly [name] is a 83 y.o. female seen for post hospital follow up of CHF. She was admitted 1/20-1/27/18 for acute respiratory failure with hypoxia secondary to acute combined systolic and diastolic CHF. She has a history of COPD, apparent old left bundle branch block, and reported history of "3 heart attacks" in the past. States that she followed with a physician in [place] for prior cardiac events, does not indicate any prior cardiac catheterization or PCI. She presented with worsening shortness of breath  with coughing. Chest x-ray was concerning for possible associated pneumonia versus asymmetric edema. She was treated with  Antibiotics. Cardiac enzymes suggest NSTEMI with peak troponin 4.91. Echo showed EF 35-40% with severe LVH and restrictive parameters. Cardiac cath showed pulmonary HTN with elevated filling pressures. Normal coronary anatomy.  She was diuresed with improvement. Started on bisoprolol (dose reduced due to bradycardia). ARB held due to ARF.   On follow up today she is doing very well. No chest pain. Breathing is much better. No cough. Weight is down 9 lbs since DC. She does note mild edema in the evening. She is following sodium restriction.      Past Medical History:  Diagnosis Date  . CHF (congestive heart failure) (HCC)   . CKD (chronic kidney disease)   . COPD (chronic obstructive pulmonary disease) (HCC)   . LBBB (left bundle branch block)     Past Surgical History:  Procedure Laterality Date  . CARDIAC CATHETERIZATION N/A 01/06/2017   Procedure: Right/Left Heart Cath and Coronary Angiography;  Surgeon: Marykay Lex, MD;  Location: [facility] INVASIVE CV LAB;  Service: Cardiovascular;  Laterality: N/A;  . COLONOSCOPY  2008   Negative    Current Medications: Outpatient  Medications Prior to Visit  Medication Sig Dispense Refill  . albuterol (PROVENTIL HFA;VENTOLIN HFA) 108 (90 Base) MCG/ACT inhaler Inhale 2 puffs into the lungs every 6 (six) hours as needed for wheezing or shortness of breath. 1 Inhaler 0  . aspirin 325 MG tablet Take 325 mg by mouth daily.    . bisoprolol (ZEBETA) 5 MG tablet Take 1 tablet (5 mg total) by mouth daily. 30 tablet 0  . Cholecalciferol (VITAMIN D3) 1000 units CAPS Take 1,000 Units by mouth daily.    . ferrous sulfate 325 (65 FE) MG EC tablet Take 325 mg by mouth 3 (three) times daily with meals.    . Fluticasone-Salmeterol (ADVAIR) 500-50 MCG/DOSE AEPB Inhale 1 puff into the lungs 2 (two) times daily. 60 each 0  . ipratropium-albuterol (DUONEB) 0.5-2.5 (3) MG/3ML SOLN Take 3 mLs by nebulization every 6 (six) hours as needed (wheezing and shortness of breath). 360 mL 0  . potassium chloride SA (K-DUR,KLOR-CON) 20 MEQ tablet Take 1 tablet (20 mEq total) by mouth daily. 30 tablet 0  . pravastatin (PRAVACHOL) 40 MG tablet Take 1 tablet (40 mg total) by mouth daily. 30 tablet 0  . torsemide (DEMADEX) 20 MG tablet Take 1 tablet (20 mg total) by mouth daily. 30 tablet 0  . umeclidinium bromide (INCRUSE ELLIPTA) 62.5 MCG/INH AEPB Inhale 1 puff into the lungs daily. 1 each 0  . vitamin B-12 (CYANOCOBALAMIN) 1000 MCG tablet Take 1,000 mcg by mouth daily.     No facility-administered medications prior to visit.  Allergies:   Patient has no known allergies.   Social History   Social History  . Marital status: Widowed    Spouse name: N/A  . Number of children: N/A  . Years of education: N/A   Social History Main Topics  . Smoking status: Former Games developer  . Smokeless tobacco: Never Used  . Alcohol use No  . Drug use: No  . Sexual activity: No   Other Topics Concern  . None   Social History Narrative  . None     Family History:  The patient's family history includes Hypertension in her father; Stroke in her father.    ROS:   Please see the history of present illness.    ROS All other systems reviewed and are negative.   PHYSICAL EXAM:   VS:  BP 128/60   Pulse (!) 55   Ht 5\' 7"  (1.702 m)   Wt 168 lb (76.2 kg)   BMI 26.31 kg/m    GEN: Pleasant elderly BF, well developed, in no acute distress  HEENT: normal  Neck: no JVD, carotid bruits, or masses Cardiac: RRR; no murmurs, rubs, or gallops,no edema  Respiratory:  clear to auscultation bilaterally, normal work of breathing GI: soft, nontender, nondistended, + BS MS: no deformity or atrophy  Skin: warm and dry, no rash Neuro:  Alert and Oriented x 3, Strength and sensation are intact Psych: euthymic mood, full affect  Wt Readings from Last 3 Encounters:  02/04/17 168 lb (76.2 kg)  01/27/17 177 lb 9.6 oz (80.6 kg)  01/25/17 174 lb 3.2 oz (79 kg)      Studies/Labs Reviewed:   EKG:  EKG is not ordered today.  The ekg ordered today demonstrates N/A  Recent Labs: 01/01/2017: ALT 57 01/02/2017: Magnesium 2.1 01/03/2017: B Natriuretic Peptide 458.4 01/08/2017: Hemoglobin 8.8; Platelets 225 01/27/2017: BUN 35; Creatinine 1.5; Potassium 3.5; Sodium 148   Lipid Panel No results found for: CHOL, TRIG, HDL, CHOLHDL, VLDL, LDLCALC, LDLDIRECT  Additional studies/ records that were reviewed today include:  Echo 01/03/17: Study Conclusions  - Left ventricle: The cavity size was normal. There was severe   concentric hypertrophy. Systolic function was moderately reduced.   The estimated ejection fraction was in the range of 35% to 40%.   Wall motion was normal; there were no regional wall motion   abnormalities. Features are consistent with a pseudonormal left   ventricular filling pattern, with concomitant abnormal relaxation   and increased filling pressure (grade 2 diastolic dysfunction).   Doppler parameters are consistent with elevated ventricular   end-diastolic filling pressure. - Aortic root: The aortic root was normal in size. - Mitral  valve: There was mild regurgitation. - Left atrium: The atrium was moderately dilated. - Right ventricle: Systolic function was normal. - Right atrium: The atrium was moderately dilated. - Tricuspid valve: There was moderate regurgitation. - Pulmonic valve: There was mild regurgitation. - Pulmonary arteries: PA peak pressure: 62 mm Hg (S). - Inferior vena cava: The vessel was dilated. The respirophasic   diameter changes were blunted (< 50%), consistent with elevated   central venous pressure. - Pericardium, extracardiac: There was no pericardial effusion.  Impressions:  - Severe concentric left ventricular hypertrophy with mildly   decreased LVEF and diffuse hypokinesis.   A cardiac MRI is recommended to evaluate for possible   infiltrative cardiomyopathy.  Cardiac cath 01/06/17: Conclusion     LV end diastolic pressure is moderately elevated consistent with moderately elevated mean pulmonary pressures.  Hemodynamic  findings consistent with mild secondary pulmonary hypertension.  Angiographically normal coronary arteries with a right dominant system. Somewhat tortuous RCA and circumflex/ramus systems    Nonischemic Cardiomyopathy with moderate to severely reduced cardiac output/index. Persistently elevated filling pressures indicating continued volume overload     ASSESSMENT:    1. LBBB (left bundle branch block)   2. Congestive dilated cardiomyopathy (HCC)   3. Nonischemic congestive cardiomyopathy (HCC)   4. COPD with acute exacerbation (HCC)   5. CKD (chronic kidney disease) stage 3, GFR 30-59 ml/min   6. Acute on chronic combined systolic and diastolic CHF (congestive heart failure) (HCC)      PLAN:  In order of problems listed above:  1. She is doing very well. CHF appears to be well compensated. Will continue current therapy with bisoprolol and torsemide. Check BMET, BNP and CBC today. Will hold ACEi/ARB/ aldactone with CKD. Some consideration given to MRI  given marked LVH ? Infiltrative disease but I am concerned to do this with her renal function and I am not sure this would change her therapy. I will follow up in 3 months.    Medication Adjustments/Labs and Tests Ordered: Current medicines are reviewed at length with the patient today.  Concerns regarding medicines are outlined above.  Medication changes, Labs and Tests ordered today are listed in the Patient Instructions below. Patient Instructions  Continue your current therapy  We will check blood work today  Continue sodium restriction  I will see you in 3 months.    Signed, Peter Swaziland, MD  02/04/2017 10:31 AM    [facility] [address], [place], [place], [address] [phone]

## 2017-02-04 ENCOUNTER — Ambulatory Visit (INDEPENDENT_AMBULATORY_CARE_PROVIDER_SITE_OTHER): Payer: Medicare Other | Admitting: Cardiology

## 2017-02-04 ENCOUNTER — Encounter: Payer: Self-pay | Admitting: Cardiology

## 2017-02-04 VITALS — BP 128/60 | HR 55 | Ht 67.0 in | Wt 168.0 lb

## 2017-02-04 DIAGNOSIS — I429 Cardiomyopathy, unspecified: Secondary | ICD-10-CM

## 2017-02-04 DIAGNOSIS — I447 Left bundle-branch block, unspecified: Secondary | ICD-10-CM | POA: Diagnosis not present

## 2017-02-04 DIAGNOSIS — I5043 Acute on chronic combined systolic (congestive) and diastolic (congestive) heart failure: Secondary | ICD-10-CM

## 2017-02-04 DIAGNOSIS — J441 Chronic obstructive pulmonary disease with (acute) exacerbation: Secondary | ICD-10-CM

## 2017-02-04 DIAGNOSIS — I420 Dilated cardiomyopathy: Secondary | ICD-10-CM

## 2017-02-04 DIAGNOSIS — N183 Chronic kidney disease, stage 3 unspecified: Secondary | ICD-10-CM

## 2017-02-04 LAB — CBC WITH DIFFERENTIAL/PLATELET
BASOS ABS: 0 {cells}/uL (ref 0–200)
Basophils Relative: 0 %
EOS PCT: 0 %
Eosinophils Absolute: 0 cells/uL — ABNORMAL LOW (ref 15–500)
HCT: 36.8 % (ref 35.0–45.0)
HEMOGLOBIN: 11.7 g/dL (ref 11.7–15.5)
LYMPHS ABS: 2889 {cells}/uL (ref 850–3900)
Lymphocytes Relative: 27 %
MCH: 29.8 pg (ref 27.0–33.0)
MCHC: 31.8 g/dL — AB (ref 32.0–36.0)
MCV: 93.6 fL (ref 80.0–100.0)
MONOS PCT: 8 %
MPV: 12.3 fL (ref 7.5–12.5)
Monocytes Absolute: 856 cells/uL (ref 200–950)
NEUTROS PCT: 65 %
Neutro Abs: 6955 cells/uL (ref 1500–7800)
PLATELETS: 267 10*3/uL (ref 140–400)
RBC: 3.93 MIL/uL (ref 3.80–5.10)
RDW: 16.3 % — AB (ref 11.0–15.0)
WBC: 10.7 10*3/uL (ref 3.8–10.8)

## 2017-02-04 LAB — BASIC METABOLIC PANEL
BUN: 64 mg/dL — ABNORMAL HIGH (ref 7–25)
CALCIUM: 9.9 mg/dL (ref 8.6–10.4)
CO2: 23 mmol/L (ref 20–31)
Chloride: 101 mmol/L (ref 98–110)
Creat: 1.89 mg/dL — ABNORMAL HIGH (ref 0.60–0.88)
GLUCOSE: 91 mg/dL (ref 65–99)
POTASSIUM: 3.6 mmol/L (ref 3.5–5.3)
SODIUM: 143 mmol/L (ref 135–146)

## 2017-02-04 NOTE — Patient Instructions (Signed)
Continue your current therapy  We will check blood work today  Continue sodium restriction  I will see you in 3 months.

## 2017-02-05 LAB — BRAIN NATRIURETIC PEPTIDE: BRAIN NATRIURETIC PEPTIDE: 500.8 pg/mL — AB (ref <100)

## 2017-02-07 ENCOUNTER — Ambulatory Visit (INDEPENDENT_AMBULATORY_CARE_PROVIDER_SITE_OTHER): Payer: Medicare Other | Admitting: Nurse Practitioner

## 2017-02-07 VITALS — BP 132/82 | HR 61 | Temp 98.1°F | Resp 18 | Ht 67.0 in | Wt 165.0 lb

## 2017-02-07 DIAGNOSIS — I110 Hypertensive heart disease with heart failure: Secondary | ICD-10-CM

## 2017-02-07 DIAGNOSIS — N179 Acute kidney failure, unspecified: Secondary | ICD-10-CM | POA: Diagnosis not present

## 2017-02-07 DIAGNOSIS — N183 Chronic kidney disease, stage 3 (moderate): Secondary | ICD-10-CM | POA: Diagnosis not present

## 2017-02-07 DIAGNOSIS — G4733 Obstructive sleep apnea (adult) (pediatric): Secondary | ICD-10-CM | POA: Diagnosis not present

## 2017-02-07 DIAGNOSIS — E782 Mixed hyperlipidemia: Secondary | ICD-10-CM

## 2017-02-07 DIAGNOSIS — F32A Depression, unspecified: Secondary | ICD-10-CM

## 2017-02-07 DIAGNOSIS — F329 Major depressive disorder, single episode, unspecified: Secondary | ICD-10-CM

## 2017-02-07 LAB — LIPID PANEL
CHOLESTEROL: 195 mg/dL (ref <200)
HDL: 66 mg/dL (ref >50)
LDL CALC: 106 mg/dL — AB (ref <100)
TRIGLYCERIDES: 113 mg/dL (ref <150)
Total CHOL/HDL Ratio: 3 Ratio (ref <5.0)
VLDL: 23 mg/dL (ref <30)

## 2017-02-07 LAB — HEPATIC FUNCTION PANEL
ALK PHOS: 73 U/L (ref 33–130)
ALT: 19 U/L (ref 6–29)
AST: 19 U/L (ref 10–35)
Albumin: 3.9 g/dL (ref 3.6–5.1)
BILIRUBIN DIRECT: 0.1 mg/dL (ref <=0.2)
BILIRUBIN INDIRECT: 0.4 mg/dL (ref 0.2–1.2)
TOTAL PROTEIN: 7 g/dL (ref 6.1–8.1)
Total Bilirubin: 0.5 mg/dL (ref 0.2–1.2)

## 2017-02-07 MED ORDER — TORSEMIDE 10 MG PO TABS: 20.0000 mg | ORAL_TABLET | Freq: Every day | ORAL | Status: DC

## 2017-02-07 MED ORDER — SERTRALINE HCL 25 MG PO TABS: 25.0000 mg | ORAL_TABLET | Freq: Every day | ORAL | 1 refills | Status: DC

## 2017-02-07 NOTE — Patient Instructions (Addendum)
Sign for record release from previous PCP in [place]   Increase water intake- but do not exceed 2L of fluid a day.   To decrease demadex to 10 mg daily -- cut tablet in half  To start zoloft 25 mg daily for depression

## 2017-02-07 NOTE — Progress Notes (Signed)
Careteam: Patient Care Team: No Pcp Per Patient as PCP - General (General Practice)  Advanced Directive information Does Patient Have a Medical Advance Directive?: No  No Known Allergies  Chief Complaint  Patient presents with  . Transitions Of Care    Pt is being seen to day to establish care after being discharged from [facility].      HPI: Patient is a 83 y.o. female seen in the office today for follow up rehab stay.  Plan to move to [place] a retirement community but there are no availability yet. Plans to move when a unit becomes available.  Pt living with son now, reports everything is going good at home.  Home health following at home. She has PT/OT and nursing.  Weight is down. Swelling is minimal.  Breathing is a lot better. Pt saw cardiologist on 02/04/17, after she has left [facility]. Following heart healthy, low sodium diet.  Does not feel like she drinks enough fluid- drinking about 16 oz of fluid TOTAL a day.  Has CPAP-- needs equipment. Has been years since she had her sleep study. Was in a hospital in [place] when she last had this done.   Review of Systems:  Review of Systems  Constitutional: Negative for activity change, appetite change, fatigue and unexpected weight change.  HENT: Negative for congestion and hearing loss.   Eyes: Negative.   Respiratory: Negative for cough and shortness of breath.   Cardiovascular: Positive for chest pain. Negative for palpitations and leg swelling.       Reports chest pains that are better after she eats.  Feels like it is indigestion   Gastrointestinal: Negative for abdominal pain, constipation and diarrhea.  Genitourinary: Negative for difficulty urinating and dysuria.  Musculoskeletal: Negative for arthralgias and myalgias.  Skin: Negative for color change and wound.  Neurological: Negative for dizziness and weakness.  Psychiatric/Behavioral: Negative for agitation, behavioral  problems and confusion.    Past Medical History:  Diagnosis Date  . CHF (congestive heart failure) (HCC)   . CKD (chronic kidney disease)   . COPD (chronic obstructive pulmonary disease) (HCC)   . Hyperlipidemia   . Hypertension   . LBBB (left bundle branch block)    Past Surgical History:  Procedure Laterality Date  . ABDOMINAL HYSTERECTOMY  1969  . APPENDECTOMY  1940  . CARDIAC CATHETERIZATION N/A 01/06/2017   Procedure: Right/Left Heart Cath and Coronary Angiography;  Surgeon: Marykay Lex, MD;  Location: [facility] INVASIVE CV LAB;  Service: Cardiovascular;  Laterality: N/A;  . carpal tunnel Right   . COLONOSCOPY  2008   Negative  . lower back Right    bone spur   Social History:   reports that she has quit smoking. She has never used smokeless tobacco. She reports that she does not drink alcohol or use drugs.  Family History  Problem Relation Age of Onset  . Hypertension Father   . Stroke Father   . Kidney failure Mother   . Dementia Brother   . COPD Neg Hx   . Heart disease Neg Hx   . Diabetes Neg Hx     Medications: Patient's Medications  New Prescriptions   No medications on file  Previous Medications   ASPIRIN 325 MG TABLET    Take 325 mg by mouth daily.   BISOPROLOL (ZEBETA) 5 MG TABLET    Take 1 tablet (5 mg total) by mouth daily.   CHOLECALCIFEROL (VITAMIN D3) 1000 UNITS CAPS  Take 1,000 Units by mouth daily.   FERROUS SULFATE 325 (65 FE) MG EC TABLET    Take 325 mg by mouth 3 (three) times daily with meals.   FLUTICASONE-SALMETEROL (ADVAIR) 500-50 MCG/DOSE AEPB    Inhale 1 puff into the lungs 2 (two) times daily.   IPRATROPIUM-ALBUTEROL (DUONEB) 0.5-2.5 (3) MG/3ML SOLN    Take 3 mLs by nebulization every 6 (six) hours as needed (wheezing and shortness of breath).   POTASSIUM CHLORIDE SA (K-DUR,KLOR-CON) 20 MEQ TABLET    Take 1 tablet (20 mEq total) by mouth daily.   PRAVASTATIN (PRAVACHOL) 40 MG TABLET    Take 1 tablet (40 mg total) by mouth daily.    TORSEMIDE (DEMADEX) 20 MG TABLET    Take 1 tablet (20 mg total) by mouth daily.   UMECLIDINIUM BROMIDE (INCRUSE ELLIPTA) 62.5 MCG/INH AEPB    Inhale 1 puff into the lungs daily.   VITAMIN B-12 (CYANOCOBALAMIN) 1000 MCG TABLET    Take 1,000 mcg by mouth daily.  Modified Medications   No medications on file  Discontinued Medications   ALBUTEROL (PROVENTIL HFA;VENTOLIN HFA) 108 (90 BASE) MCG/ACT INHALER    Inhale 2 puffs into the lungs every 6 (six) hours as needed for wheezing or shortness of breath.     Physical Exam:  Vitals:   02/07/17 0917  BP: 132/82  Pulse: 61  Resp: 18  Temp: 98.1 F (36.7 C)  TempSrc: Oral  SpO2: 96%  Weight: 165 lb (74.8 kg)  Height: 5\' 7"  (1.702 m)   Body mass index is 25.84 kg/m.  Physical Exam  Constitutional: She is oriented to person, place, and time. She appears well-developed and well-nourished. No distress.  HENT:  Head: Normocephalic and atraumatic.  Mouth/Throat: Oropharynx is clear and moist. No oropharyngeal exudate.  Eyes: Conjunctivae are normal. Pupils are equal, round, and reactive to light.  Neck: Normal range of motion. Neck supple. No JVD present.  Cardiovascular: Normal rate, regular rhythm and normal heart sounds.   Pulmonary/Chest: Effort normal and breath sounds normal.  Abdominal: Soft. Bowel sounds are normal.  Musculoskeletal: She exhibits no edema or tenderness.  Neurological: She is alert and oriented to person, place, and time.  Skin: Skin is warm and dry. She is not diaphoretic.  Psychiatric: She has a normal mood and affect.    Labs reviewed: Basic Metabolic Panel:  Recent Labs  16/10/96 1609 01/02/17 0226  01/06/17 0356  01/07/17 0405 01/25/17 01/27/17 02/04/17 1120  NA  --  142  < > 141  --  139 148* 148* 143  K  --  2.9*  < > 4.1  --  4.1 3.6 3.5 3.6  CL  --  104  < > 104  --  105  --   --  101  CO2  --  27  < > 27  --  27  --   --  23  GLUCOSE  --  152*  < > 156*  --  122*  --   --  91  BUN  --  30*   < > 65*  --  54* 29* 35* 64*  CREATININE  --  1.39*  < > 1.31*  < > 1.29* 1.2* 1.5* 1.89*  CALCIUM  --  8.9  < > 8.9  --  8.8*  --   --  9.9  MG  --  2.1  --   --   --   --   --   --   --  PHOS 4.5  --   --   --   --   --   --   --   --   < > = values in this interval not displayed.  Estimated Creatinine Clearance: 23.8 mL/min (by C-G formula based on SCr of 1.89 mg/dL (H)).  Liver Function Tests:  Recent Labs  01/01/17 1046  AST 44*  ALT 57*  ALKPHOS 82  BILITOT 1.1  PROT 8.2*  ALBUMIN 3.9   No results for input(s): LIPASE, AMYLASE in the last 8760 hours. No results for input(s): AMMONIA in the last 8760 hours. CBC:  Recent Labs  01/01/17 1046  01/06/17 1244 01/08/17 0422 02/04/17 1120  WBC 20.8*  < > 15.7* 10.8* 10.7  NEUTROABS 9.5*  --   --   --  6,955  HGB 10.4*  < > 9.1* 8.8* 11.7  HCT 33.6*  < > 28.7* 27.3* 36.8  MCV 94.1  < > 91.4 91.0 93.6  PLT 232  < > 219 225 267  < > = values in this interval not displayed. Lipid Panel: No results for input(s): CHOL, HDL, LDLCALC, TRIG, CHOLHDL, LDLDIRECT in the last 8760 hours. TSH: No results for input(s): TSH in the last 8760 hours. A1C: Lab Results  Component Value Date   HGBA1C 5.5 01/01/2017     Assessment/Plan 1. Hypertensive heart disease with heart failure (HCC) Stable, pt with rise in Cr will decrease Demadex. To weight daily. To increase fluid consumption but not to exceed 2L daily - torsemide (DEMADEX) 10 MG tablet by mouth daily.  2. Obstructive sleep apnea syndrome conts with CPAP, home health to help with getting equipment for CPAP  3. Depression, unspecified depression type Worsening depression, no SI or HI - sertraline (ZOLOFT) 25 MG tablet; Take 1 tablet (25 mg total) by mouth daily.  Dispense: 30 tablet; Refill: 1  4. Acute renal failure superimposed on stage 3 chronic kidney disease, unspecified acute renal failure type (HCC) Will decrease Demadex to 1 tablet daily Pt to increase hydration  as she is only drinking 16 oz a day.  To avoid NSAIDs   5. Mixed hyperlipidemia - Lipid Panel - Hepatic function panel  Follow up in 2 weeks.  Janene Harvey. Biagio Borg  [facility] [phone] 8 am - 5 pm) [phone] (after hours)

## 2017-02-09 MED ORDER — TORSEMIDE 10 MG PO TABS: 10.0000 mg | ORAL_TABLET | Freq: Every day | ORAL | Status: DC

## 2017-02-21 ENCOUNTER — Ambulatory Visit: Payer: Medicare Other | Admitting: Nurse Practitioner

## 2017-02-23 ENCOUNTER — Telehealth: Payer: Self-pay | Admitting: *Deleted

## 2017-02-23 NOTE — Telephone Encounter (Signed)
Paperwork faxed and placed in Jessica's folder to review and have Dr. Renato Gails sign. This is a patient that Shanda Bumps approved for Aurther Loft to give Verbal orders for Home Health in between [place] and establishing with our office.

## 2017-02-23 NOTE — Telephone Encounter (Signed)
CJ Boone with [employer] called and stated that they had faxed over a 485 for signature and it was faxed back to them to have Dr. Marga Melnick to sign. CJ Lissa Hoard stated that on 2/16 a verbal order was given by Aurther Loft and on 2/20 a verbal order was given by Amil Amen. Informed her that we didn't have anyone in the office by the name Amil Amen and she stated that the name probably got mixed up.  Patient has established with our office on 02/07/17 with Shanda Bumps. I told her to refax the 485 and I would give it to [place] to review and decide if she would sign. CJ stated Verbals were given.  Awaiting fax.

## 2017-02-26 ENCOUNTER — Other Ambulatory Visit: Payer: Self-pay | Admitting: Nurse Practitioner

## 2017-02-26 DIAGNOSIS — J441 Chronic obstructive pulmonary disease with (acute) exacerbation: Secondary | ICD-10-CM

## 2017-02-26 DIAGNOSIS — I110 Hypertensive heart disease with heart failure: Secondary | ICD-10-CM

## 2017-03-01 ENCOUNTER — Ambulatory Visit (INDEPENDENT_AMBULATORY_CARE_PROVIDER_SITE_OTHER): Payer: Medicare Other | Admitting: Nurse Practitioner

## 2017-03-01 ENCOUNTER — Encounter: Payer: Self-pay | Admitting: Nurse Practitioner

## 2017-03-01 VITALS — BP 136/84 | HR 68 | Temp 98.4°F | Resp 17 | Ht 67.0 in | Wt 183.8 lb

## 2017-03-01 DIAGNOSIS — I209 Angina pectoris, unspecified: Secondary | ICD-10-CM

## 2017-03-01 DIAGNOSIS — F329 Major depressive disorder, single episode, unspecified: Secondary | ICD-10-CM

## 2017-03-01 DIAGNOSIS — N183 Chronic kidney disease, stage 3 (moderate): Secondary | ICD-10-CM

## 2017-03-01 DIAGNOSIS — F32A Depression, unspecified: Secondary | ICD-10-CM

## 2017-03-01 DIAGNOSIS — I25118 Atherosclerotic heart disease of native coronary artery with other forms of angina pectoris: Secondary | ICD-10-CM | POA: Diagnosis not present

## 2017-03-01 DIAGNOSIS — N179 Acute kidney failure, unspecified: Secondary | ICD-10-CM | POA: Diagnosis not present

## 2017-03-01 DIAGNOSIS — I110 Hypertensive heart disease with heart failure: Secondary | ICD-10-CM | POA: Diagnosis not present

## 2017-03-01 LAB — BASIC METABOLIC PANEL WITH GFR
BUN: 30 mg/dL — AB (ref 7–25)
CO2: 25 mmol/L (ref 20–31)
CREATININE: 1.37 mg/dL — AB (ref 0.60–0.88)
Calcium: 9.1 mg/dL (ref 8.6–10.4)
Chloride: 106 mmol/L (ref 98–110)
GFR, EST AFRICAN AMERICAN: 41 mL/min — AB (ref >=60)
GFR, Est Non African American: 36 mL/min — ABNORMAL LOW (ref >=60)
GLUCOSE: 106 mg/dL — AB (ref 65–99)
Potassium: 4 mmol/L (ref 3.5–5.3)
Sodium: 144 mmol/L (ref 135–146)

## 2017-03-01 MED ORDER — ISOSORBIDE MONONITRATE ER 30 MG PO TB24: 30.0000 mg | ORAL_TABLET | Freq: Every day | ORAL | 1 refills | Status: DC

## 2017-03-01 MED ORDER — TORSEMIDE 10 MG PO TABS: 10.0000 mg | ORAL_TABLET | Freq: Every day | ORAL | 2 refills | Status: DC

## 2017-03-01 MED ORDER — POTASSIUM CHLORIDE CRYS ER 20 MEQ PO TBCR: 20.0000 meq | EXTENDED_RELEASE_TABLET | Freq: Every day | ORAL | 1 refills | Status: DC

## 2017-03-01 NOTE — Progress Notes (Signed)
Careteam: Patient Care Team: No Pcp Per Patient as PCP - General (General Practice)  Advanced Directive information Does Patient Have a Medical Advance Directive?: No  No Known Allergies  Chief Complaint  Patient presents with  . Acute Visit    Pt is being seen for 2 week follow up on kidneys and depression      HPI: Patient is a 83 y.o. female seen in the office today for follow up for depression and CHF. Last visit pt's Demadex was decreased to 22m PO daily due to ARF and dehydration. She also had complaints of depression for which she was started on Zoloft with improvements today. She reports that she has increased her fluid intake, is eating better, and her mood is stablizing, although she remains flat in her affect. She has had a 20lb weight gain in the last three weeks due to the increased fluid intake for which she admits to drinking up to 2 L of water per day.    She has had ongoing intermittent chest pain radiating to her left shoulder in which she has had since her NSTEMI in January of this year. Denies pain today. She is followed closely by Cardiology (Dr. JMartinique. Her next appointment is scheduled for May 2018.  She is managed medically at this point. She admits to this chest pain at rest, not associated with dizziness, syncope, falls, or palpitations. She has been having SOB, although this is most likely related to her increased fluid intake and weight gain.   Review of Systems:  Review of Systems  Constitutional: Positive for appetite change and unexpected weight change. Negative for activity change, chills, diaphoresis, fatigue and fever.  HENT: Negative for congestion, postnasal drip, rhinorrhea and sore throat.   Respiratory: Positive for shortness of breath. Negative for apnea, cough, choking, chest tightness and wheezing.   Cardiovascular: Positive for chest pain. Negative for palpitations and leg swelling.  Neurological: Negative for dizziness, syncope, weakness,  light-headedness, numbness and headaches.  Psychiatric/Behavioral: Negative for confusion and sleep disturbance. The patient is nervous/anxious.     Past Medical History:  Diagnosis Date  . CHF (congestive heart failure) ([place]   . CKD (chronic kidney disease)   . COPD (chronic obstructive pulmonary disease) ([place]   . Hyperlipidemia   . Hypertension   . LBBB (left bundle branch block)   . OSA (obstructive sleep apnea)    Past Surgical History:  Procedure Laterality Date  . ABDOMINAL HYSTERECTOMY  1969  . APPENDECTOMY  1940  . CARDIAC CATHETERIZATION N/A 01/06/2017   Procedure: Right/Left Heart Cath and Coronary Angiography;  Surgeon: DLeonie Man MD;  [facility];  Service: Cardiovascular;  Laterality: N/A;  . carpal tunnel Right   . COLONOSCOPY  2008   Negative  . lower back Right    bone spur   Social History:   reports that she has quit smoking. She has never used smokeless tobacco. She reports that she does not drink alcohol or use drugs.  Family History  Problem Relation Age of Onset  . Hypertension Father   . Stroke Father   . Kidney failure Mother   . Dementia Brother   . COPD Neg Hx   . Heart disease Neg Hx   . Diabetes Neg Hx     Medications: Patient's Medications  New Prescriptions   No medications on file  Previous Medications   ADVAIR DISKUS 500-50 MCG/DOSE AEPB    INHALE 1 PUFF INTO THE LUNGS 2 (  TWO) TIMES DAILY.   ASPIRIN 325 MG TABLET    Take 325 mg by mouth daily.   BISOPROLOL (ZEBETA) 5 MG TABLET    Take 1 tablet (5 mg total) by mouth daily.   CHOLECALCIFEROL (VITAMIN D3) 1000 UNITS CAPS    Take 1,000 Units by mouth daily.   FERROUS SULFATE 325 (65 FE) MG EC TABLET    Take 325 mg by mouth 3 (three) times daily with meals.   INCRUSE ELLIPTA 62.5 MCG/INH AEPB    INHALE 1 PUFF INTO THE LUNGS DAILY.   IPRATROPIUM-ALBUTEROL (DUONEB) 0.5-2.5 (3) MG/3ML SOLN    Take 3 mLs by nebulization every 6 (six) hours as needed (wheezing and  shortness of breath).   KLOR-CON M20 20 MEQ TABLET    TAKE 1 TABLET (20 MEQ TOTAL) BY MOUTH DAILY.   PRAVASTATIN (PRAVACHOL) 40 MG TABLET    Take 1 tablet (40 mg total) by mouth daily.   SERTRALINE (ZOLOFT) 25 MG TABLET    Take 1 tablet (25 mg total) by mouth daily.   TORSEMIDE (DEMADEX) 10 MG TABLET    Take 1 tablet (10 mg total) by mouth daily.   VITAMIN B-12 (CYANOCOBALAMIN) 1000 MCG TABLET    Take 1,000 mcg by mouth daily.  Modified Medications   No medications on file  Discontinued Medications   TORSEMIDE (DEMADEX) 20 MG TABLET    TAKE 1 TABLET (20 MG TOTAL) BY MOUTH DAILY.     Physical Exam:  Vitals:   03/01/17 1258  BP: 136/84  Pulse: 68  Resp: 17  Temp: 98.4 F (36.9 C)  TempSrc: Oral  SpO2: 96%  Weight: 183 lb 12.8 oz (83.4 kg)  Height: _0  (1.702 m)   Body mass index is 28.79 kg/m.  Physical Exam  Constitutional: She is oriented to person, place, and time. She appears well-developed and well-nourished. No distress.  HENT:  Head: Normocephalic and atraumatic.  Eyes: Conjunctivae and EOM are normal. Pupils are equal, round, and reactive to light. Right eye exhibits no discharge. Left eye exhibits no discharge.  Neck: Normal range of motion. JVD present.  Cardiovascular: Normal rate, regular rhythm, normal heart sounds and intact distal pulses.   No murmur heard. Pulses:      Radial pulses are 2+ on the right side, and 2+ on the left side.       Posterior tibial pulses are 1+ on the right side, and 1+ on the left side.  Pulmonary/Chest: Effort normal and breath sounds normal. No respiratory distress. She has no wheezes. She has no rales.  Abdominal: Soft. Bowel sounds are normal. She exhibits no distension. There is no tenderness. There is no guarding.  Musculoskeletal: Normal range of motion. She exhibits edema (1+ pitting bilaterally). She exhibits no tenderness.  Walks with cane assistance  Lymphadenopathy:    She has no cervical adenopathy.  Neurological:  She is alert and oriented to person, place, and time. She is not disoriented.  Skin: Skin is warm and dry. She is not diaphoretic. No erythema. No pallor.  Psychiatric: Her speech is normal and behavior is normal. Judgment and thought content normal. Cognition and memory are normal.  Flat affect    Labs reviewed: Basic Metabolic Panel:  Recent Labs  01/01/17 1609 01/02/17 0226  01/06/17 0356  01/07/17 0405 01/25/17 01/27/17 02/04/17 1120  NA  --  142  < > 141  --  139 148* 148* 143  K  --  2.9*  < > 4.1  --  4.1 3.6 3.5 3.6  CL  --  104  < > 104  --  105  --   --  101  CO2  --  27  < > 27  --  27  --   --  23  GLUCOSE  --  152*  < > 156*  --  122*  --   --  91  BUN  --  30*  < > 65*  --  54* 29* 35* 64*  CREATININE  --  1.39*  < > 1.31*  < > 1.29* 1.2* 1.5* 1.89*  CALCIUM  --  8.9  < > 8.9  --  8.8*  --   --  9.9  MG  --  2.1  --   --   --   --   --   --   --   PHOS 4.5  --   --   --   --   --   --   --   --   < > = values in this interval not displayed. Liver Function Tests:  Recent Labs  01/01/17 1046 02/07/17 1033  AST 44* 19  ALT 57* 19  ALKPHOS 82 73  BILITOT 1.1 0.5  PROT 8.2* 7.0  ALBUMIN 3.9 3.9   No results for input(s): LIPASE, AMYLASE in the last 8760 hours. No results for input(s): AMMONIA in the last 8760 hours. CBC:  Recent Labs  01/01/17 1046  01/06/17 1244 01/08/17 0422 02/04/17 1120  WBC 20.8*  < > 15.7* 10.8* 10.7  NEUTROABS 9.5*  --   --   --  6,955  HGB 10.4*  < > 9.1* 8.8* 11.7  HCT 33.6*  < > 28.7* 27.3* 36.8  MCV 94.1  < > 91.4 91.0 93.6  PLT 232  < > 219 225 267  < > = values in this interval not displayed. Lipid Panel:  Recent Labs  02/07/17 1033  CHOL 195  HDL 66  LDLCALC 106*  TRIG 113  CHOLHDL 3.0   TSH: No results for input(s): TSH in the last 8760 hours. A1C: Lab Results  Component Value Date   HGBA1C 5.5 01/01/2017     Assessment/Plan  1. Hypertensive heart disease with heart failure ([place]) -blood pressure  stable, pt has old Rx for norvasc taking intermediately at this time. Will have her HOLD this medication at this time. To continue Demadex 22m PO QD for increased fluid-- may need to increase to 20 mg daily based on labs.  -Decrease fluid from 2L to 1500-1800L -Will see pt back in two weeks to evaluate effectiveness of treatment and weight -refilling potassium chloride SA (KLOR-CON M20) 20 MEQ tablet; Take 1 tablet (20 mEq total) by mouth daily.  Dispense: 30 tablet; Refill: 1 -BMP with eGFR today  2. Coronary artery disease of native artery of native heart with stable angina pectoris ([place] -hx of NSTEMI -Will add isosorbide mononitrate 374mPO QD for intermittent CP -Keep Cardiology appointment for May -Contact to office or to the ED if CP becomes unbearable, including loss of consciousness, dizziness, severe shortness of breath. -Will follow in two weeks, sooner if needed  -BMP with eGFR today  3. Acute renal failure superimposed on stage 3 chronic kidney disease, unspecified acute renal failure type (HCC) -Continue Demadex 1035mO daily -Keep fluid intake to no more than 1500-1800m67mer day -Avoid NSAIDS and other nephrotoxic medications  4. Depression Improving symptoms. Continue zoloft 25mg73mdaily. Will reevaluate at  next visit when medication potential is maximized  Follow up in 2 weeks  Emilie Carp K. Harle Battiest  [facility] [phone] 8 am - 5 pm) [phone] (after hours)

## 2017-03-01 NOTE — Patient Instructions (Signed)
Will start IMDUR 30 mg daily -- this will also effect your blood pressure  Do NOT take norvasc for now  Will get labs today  Follow up in 2 more weeks

## 2017-03-06 ENCOUNTER — Other Ambulatory Visit: Payer: Self-pay | Admitting: Nurse Practitioner

## 2017-03-06 DIAGNOSIS — I420 Dilated cardiomyopathy: Secondary | ICD-10-CM

## 2017-03-06 DIAGNOSIS — I110 Hypertensive heart disease with heart failure: Secondary | ICD-10-CM

## 2017-03-22 ENCOUNTER — Inpatient Hospital Stay (HOSPITAL_COMMUNITY)
Admission: EM | Admit: 2017-03-22 | Discharge: 2017-03-25 | DRG: 291 | Disposition: A | Discharge status: Home Health | Payer: Medicare Other | Attending: Internal Medicine | Admitting: Internal Medicine

## 2017-03-22 ENCOUNTER — Telehealth: Payer: Self-pay | Admitting: Cardiology

## 2017-03-22 ENCOUNTER — Encounter (HOSPITAL_COMMUNITY): Payer: Self-pay

## 2017-03-22 ENCOUNTER — Emergency Department (HOSPITAL_COMMUNITY): Payer: Medicare Other

## 2017-03-22 ENCOUNTER — Encounter: Payer: Self-pay | Admitting: Nurse Practitioner

## 2017-03-22 ENCOUNTER — Telehealth: Payer: Self-pay

## 2017-03-22 ENCOUNTER — Ambulatory Visit (INDEPENDENT_AMBULATORY_CARE_PROVIDER_SITE_OTHER): Payer: Medicare Other | Admitting: Nurse Practitioner

## 2017-03-22 VITALS — BP 134/78 | HR 82 | Temp 98.0°F | Resp 19 | Ht 67.0 in | Wt 180.8 lb

## 2017-03-22 DIAGNOSIS — I081 Rheumatic disorders of both mitral and tricuspid valves: Secondary | ICD-10-CM | POA: Diagnosis present

## 2017-03-22 DIAGNOSIS — N179 Acute kidney failure, unspecified: Secondary | ICD-10-CM | POA: Diagnosis not present

## 2017-03-22 DIAGNOSIS — I5043 Acute on chronic combined systolic (congestive) and diastolic (congestive) heart failure: Secondary | ICD-10-CM | POA: Diagnosis present

## 2017-03-22 DIAGNOSIS — I10 Essential (primary) hypertension: Secondary | ICD-10-CM | POA: Diagnosis not present

## 2017-03-22 DIAGNOSIS — Z8249 Family history of ischemic heart disease and other diseases of the circulatory system: Secondary | ICD-10-CM | POA: Diagnosis not present

## 2017-03-22 DIAGNOSIS — Z79899 Other long term (current) drug therapy: Secondary | ICD-10-CM

## 2017-03-22 DIAGNOSIS — I428 Other cardiomyopathies: Secondary | ICD-10-CM | POA: Diagnosis present

## 2017-03-22 DIAGNOSIS — R001 Bradycardia, unspecified: Secondary | ICD-10-CM | POA: Diagnosis present

## 2017-03-22 DIAGNOSIS — E876 Hypokalemia: Secondary | ICD-10-CM | POA: Diagnosis not present

## 2017-03-22 DIAGNOSIS — R251 Tremor, unspecified: Secondary | ICD-10-CM | POA: Diagnosis not present

## 2017-03-22 DIAGNOSIS — N183 Chronic kidney disease, stage 3 unspecified: Secondary | ICD-10-CM | POA: Diagnosis present

## 2017-03-22 DIAGNOSIS — R079 Chest pain, unspecified: Secondary | ICD-10-CM | POA: Diagnosis not present

## 2017-03-22 DIAGNOSIS — F329 Major depressive disorder, single episode, unspecified: Secondary | ICD-10-CM | POA: Diagnosis present

## 2017-03-22 DIAGNOSIS — I371 Nonrheumatic pulmonary valve insufficiency: Secondary | ICD-10-CM | POA: Diagnosis present

## 2017-03-22 DIAGNOSIS — Z841 Family history of disorders of kidney and ureter: Secondary | ICD-10-CM | POA: Diagnosis not present

## 2017-03-22 DIAGNOSIS — Z7982 Long term (current) use of aspirin: Secondary | ICD-10-CM

## 2017-03-22 DIAGNOSIS — J449 Chronic obstructive pulmonary disease, unspecified: Secondary | ICD-10-CM | POA: Diagnosis present

## 2017-03-22 DIAGNOSIS — G4733 Obstructive sleep apnea (adult) (pediatric): Secondary | ICD-10-CM | POA: Diagnosis present

## 2017-03-22 DIAGNOSIS — I491 Atrial premature depolarization: Secondary | ICD-10-CM | POA: Diagnosis present

## 2017-03-22 DIAGNOSIS — E875 Hyperkalemia: Secondary | ICD-10-CM | POA: Diagnosis not present

## 2017-03-22 DIAGNOSIS — I110 Hypertensive heart disease with heart failure: Secondary | ICD-10-CM | POA: Diagnosis not present

## 2017-03-22 DIAGNOSIS — I209 Angina pectoris, unspecified: Secondary | ICD-10-CM

## 2017-03-22 DIAGNOSIS — E785 Hyperlipidemia, unspecified: Secondary | ICD-10-CM | POA: Diagnosis present

## 2017-03-22 DIAGNOSIS — Z90710 Acquired absence of both cervix and uterus: Secondary | ICD-10-CM

## 2017-03-22 DIAGNOSIS — I272 Pulmonary hypertension, unspecified: Secondary | ICD-10-CM | POA: Diagnosis present

## 2017-03-22 DIAGNOSIS — R0789 Other chest pain: Secondary | ICD-10-CM

## 2017-03-22 DIAGNOSIS — I25118 Atherosclerotic heart disease of native coronary artery with other forms of angina pectoris: Secondary | ICD-10-CM | POA: Diagnosis not present

## 2017-03-22 DIAGNOSIS — I130 Hypertensive heart and chronic kidney disease with heart failure and stage 1 through stage 4 chronic kidney disease, or unspecified chronic kidney disease: Secondary | ICD-10-CM | POA: Diagnosis not present

## 2017-03-22 DIAGNOSIS — Z87891 Personal history of nicotine dependence: Secondary | ICD-10-CM | POA: Diagnosis not present

## 2017-03-22 DIAGNOSIS — I447 Left bundle-branch block, unspecified: Secondary | ICD-10-CM | POA: Diagnosis present

## 2017-03-22 DIAGNOSIS — I248 Other forms of acute ischemic heart disease: Secondary | ICD-10-CM | POA: Diagnosis present

## 2017-03-22 DIAGNOSIS — I119 Hypertensive heart disease without heart failure: Secondary | ICD-10-CM | POA: Diagnosis present

## 2017-03-22 HISTORY — DX: Non-ST elevation (NSTEMI) myocardial infarction: I21.4

## 2017-03-22 HISTORY — DX: Unspecified osteoarthritis, unspecified site: M19.90

## 2017-03-22 HISTORY — DX: Cardiac murmur, unspecified: R01.1

## 2017-03-22 HISTORY — DX: Major depressive disorder, single episode, unspecified: F32.9

## 2017-03-22 HISTORY — DX: Anemia, unspecified: D64.9

## 2017-03-22 HISTORY — DX: Gastro-esophageal reflux disease without esophagitis: K21.9

## 2017-03-22 HISTORY — DX: Chronic kidney disease, stage 3 unspecified: N18.30

## 2017-03-22 HISTORY — DX: Angina pectoris, unspecified: I20.9

## 2017-03-22 HISTORY — DX: Chronic kidney disease, stage 3 (moderate): N18.3

## 2017-03-22 HISTORY — DX: Dependence on other enabling machines and devices: Z99.89

## 2017-03-22 HISTORY — DX: Pneumonia, unspecified organism: J18.9

## 2017-03-22 HISTORY — DX: Obstructive sleep apnea (adult) (pediatric): G47.33

## 2017-03-22 HISTORY — DX: Depression, unspecified: F32.A

## 2017-03-22 HISTORY — DX: Type 2 diabetes mellitus without complications: E11.9

## 2017-03-22 LAB — I-STAT TROPONIN, ED: Troponin i, poc: 0.76 ng/mL (ref 0.00–0.08)

## 2017-03-22 LAB — CBC
HEMATOCRIT: 33.4 % — AB (ref 36.0–46.0)
HEMOGLOBIN: 10.5 g/dL — AB (ref 12.0–15.0)
MCH: 29.3 pg (ref 26.0–34.0)
MCHC: 31.4 g/dL (ref 30.0–36.0)
MCV: 93.3 fL (ref 78.0–100.0)
Platelets: 192 10*3/uL (ref 150–400)
RBC: 3.58 MIL/uL — ABNORMAL LOW (ref 3.87–5.11)
RDW: 16.2 % — ABNORMAL HIGH (ref 11.5–15.5)
WBC: 8.6 10*3/uL (ref 4.0–10.5)

## 2017-03-22 LAB — BASIC METABOLIC PANEL WITH GFR
BUN: 36 mg/dL — ABNORMAL HIGH (ref 7–25)
CALCIUM: 9 mg/dL (ref 8.6–10.4)
CO2: 22 mmol/L (ref 20–31)
Chloride: 107 mmol/L (ref 98–110)
Creat: 1.76 mg/dL — ABNORMAL HIGH (ref 0.60–0.88)
GFR, EST AFRICAN AMERICAN: 30 mL/min — AB (ref >=60)
GFR, Est Non African American: 26 mL/min — ABNORMAL LOW (ref >=60)
Glucose, Bld: 108 mg/dL — ABNORMAL HIGH (ref 65–99)
Potassium: 3.6 mmol/L (ref 3.5–5.3)
SODIUM: 145 mmol/L (ref 135–146)

## 2017-03-22 LAB — BASIC METABOLIC PANEL
ANION GAP: 16 — AB (ref 5–15)
BUN: 35 mg/dL — ABNORMAL HIGH (ref 6–20)
CO2: 24 mmol/L (ref 22–32)
Calcium: 9.3 mg/dL (ref 8.9–10.3)
Chloride: 103 mmol/L (ref 101–111)
Creatinine, Ser: 1.73 mg/dL — ABNORMAL HIGH (ref 0.44–1.00)
GFR calc Af Amer: 30 mL/min — ABNORMAL LOW (ref >60)
GFR, EST NON AFRICAN AMERICAN: 26 mL/min — AB (ref >60)
GLUCOSE: 99 mg/dL (ref 65–99)
POTASSIUM: 3.5 mmol/L (ref 3.5–5.1)
Sodium: 143 mmol/L (ref 135–145)

## 2017-03-22 MED ORDER — TORSEMIDE 20 MG PO TABS: 20.0000 mg | ORAL_TABLET | Freq: Every day | ORAL | 1 refills | Status: DC

## 2017-03-22 MED ORDER — ISOSORBIDE MONONITRATE ER 60 MG PO TB24: 60.0000 mg | ORAL_TABLET | Freq: Every day | ORAL | 2 refills | Status: DC

## 2017-03-22 NOTE — Progress Notes (Signed)
Careteam: Patient Care Team: Sharon Seller, NP as PCP - General (Geriatric Medicine)  Advanced Directive information Does Patient Have a Medical Advance Directive?: No, Would patient like information on creating a medical advance directive?: Yes (MAU/Ambulatory/Procedural Areas - Information given)  No Known Allergies  Chief Complaint  Patient presents with  . Follow-up    Patient is being seen for a 2 week follow up on blood pressure and chest pain.      HPI: Patient is a 83 y.o. female seen in the office today for 2 week CHF/BP follow up. Pt states that she has been taking her medications as prescribed and has decreased her fluid intake to 4 glasses of water per day, down from 2L per day. Her blood pressure is stable today. Her weight is down approximately 4 pounds since her last visit. She is reporting intermittent ongoing shortness of breat at rest and intermittent, chronic chest pain, 5/10 in severity, states the IMDUR which was started at last visit helped some. She is not currently having CP in the office today. She denies palpitations, back pain, or numbness and tingling in her arms/hands. She has known cardiac disease and is being medically managed by Dr. [place] for her care.   She has additional concerns for Parkinson's disease due to essential tremor present in bilateral hands. She has been dx with this in the past, was on medication, however was taken off of her medication after changing PCP's who then stated that she did not have Parkinson's. [place] this [place], therefore, there are no records of this at this time.   Review of Systems:  Review of Systems  Constitutional: Negative for activity change, appetite change, chills, diaphoresis, fatigue, fever and unexpected weight change.  Respiratory: Negative for cough, chest tightness, shortness of breath and wheezing.   Cardiovascular: Positive for leg swelling. Negative for chest pain and  palpitations.  Gastrointestinal: Negative for abdominal pain, constipation, diarrhea, nausea and vomiting.  Psychiatric/Behavioral: Negative for agitation, behavioral problems and confusion. The patient is not nervous/anxious.     Past Medical History:  Diagnosis Date  . CHF (congestive heart failure) (HCC)   . CKD (chronic kidney disease)   . COPD (chronic obstructive pulmonary disease) (HCC)   . Hyperlipidemia   . Hypertension   . LBBB (left bundle branch block)   . OSA (obstructive sleep apnea)    Past Surgical History:  Procedure Laterality Date  . ABDOMINAL HYSTERECTOMY  1969  . APPENDECTOMY  1940  . CARDIAC CATHETERIZATION N/A 01/06/2017   Procedure: Right/Left Heart Cath and Coronary Angiography;  Surgeon: Marykay Lex, MD;  Location: [facility] INVASIVE CV LAB;  Service: Cardiovascular;  Laterality: N/A;  . carpal tunnel Right   . COLONOSCOPY  2008   Negative  . lower back Right    bone spur   Social History:   reports that she has quit smoking. She has never used smokeless tobacco. She reports that she does not drink alcohol or use drugs.  Family History  Problem Relation Age of Onset  . Hypertension Father   . Stroke Father   . Kidney failure Mother   . Dementia Brother   . COPD Neg Hx   . Heart disease Neg Hx   . Diabetes Neg Hx     Medications: Patient's Medications  New Prescriptions   No medications on file  Previous Medications   ADVAIR DISKUS 500-50 MCG/DOSE AEPB    INHALE 1 PUFF  INTO THE LUNGS 2 (TWO) TIMES DAILY.   ASPIRIN 325 MG TABLET    Take 325 mg by mouth daily.   BISOPROLOL (ZEBETA) 5 MG TABLET    TAKE 1 TABLET (5 MG TOTAL) BY MOUTH DAILY.   CHOLECALCIFEROL (VITAMIN D3) 1000 UNITS CAPS    Take 1,000 Units by mouth daily.   FERROUS SULFATE 325 (65 FE) MG EC TABLET    Take 325 mg by mouth 3 (three) times daily with meals.   INCRUSE ELLIPTA 62.5 MCG/INH AEPB    INHALE 1 PUFF INTO THE LUNGS DAILY.   ISOSORBIDE MONONITRATE (IMDUR) 30 MG 24 HR TABLET     Take 1 tablet (30 mg total) by mouth daily.   POTASSIUM CHLORIDE SA (KLOR-CON M20) 20 MEQ TABLET    Take 1 tablet (20 mEq total) by mouth daily.   PRAVASTATIN (PRAVACHOL) 40 MG TABLET    Take 1 tablet (40 mg total) by mouth daily.   SERTRALINE (ZOLOFT) 25 MG TABLET    Take 1 tablet (25 mg total) by mouth daily.   TORSEMIDE (DEMADEX) 10 MG TABLET    Take 1 tablet (10 mg total) by mouth daily.   VITAMIN B-12 (CYANOCOBALAMIN) 1000 MCG TABLET    Take 1,000 mcg by mouth daily.  Modified Medications   No medications on file  Discontinued Medications   IPRATROPIUM-ALBUTEROL (DUONEB) 0.5-2.5 (3) MG/3ML SOLN    Take 3 mLs by nebulization every 6 (six) hours as needed (wheezing and shortness of breath).     Physical Exam:  Vitals:   03/22/17 1020  BP: 134/78  Pulse: 82  Resp: 19  Temp: 98 F (36.7 C)  TempSrc: Oral  SpO2: 95%  Weight: 180 lb 12.8 oz (82 kg)  Height: 5\' 7"  (1.702 m)   Body mass index is 28.32 kg/m.  Physical Exam  Constitutional: She is oriented to person, place, and time. She appears well-developed and well-nourished. No distress.  HENT:  Head: Normocephalic and atraumatic.  Mouth/Throat: Oropharynx is clear and moist.  Eyes: Conjunctivae and EOM are normal. Pupils are equal, round, and reactive to light.  Neck: JVD present.  Cardiovascular: Normal rate, regular rhythm, normal heart sounds and intact distal pulses.  PMI is not displaced.   No murmur heard. Pulses:      Radial pulses are 2+ on the right side, and 2+ on the left side.       Dorsalis pedis pulses are 1+ on the right side, and 1+ on the left side.  Pulmonary/Chest: Effort normal and breath sounds normal. No respiratory distress. She has no wheezes. She exhibits no tenderness.  Musculoskeletal: She exhibits edema.  1+ bilateral lower extremity edema  Neurological: She is alert and oriented to person, place, and time.  Bilateral upper extremity essential tremor noted.   Skin: Skin is warm and dry. She  is not diaphoretic. No erythema.  Psychiatric: She has a normal mood and affect. Her behavior is normal. Judgment and thought content normal.  Flat affect    Labs reviewed: Basic Metabolic Panel:  Recent Labs  40/98/11 1609 01/02/17 0226  01/07/17 0405  01/27/17 02/04/17 1120 03/01/17 1344  NA  --  142  < > 139  < > 148* 143 144  K  --  2.9*  < > 4.1  < > 3.5 3.6 4.0  CL  --  104  < > 105  --   --  101 106  CO2  --  27  < > 27  --   --  23 25  GLUCOSE  --  152*  < > 122*  --   --  91 106*  BUN  --  30*  < > 54*  < > 35* 64* 30*  CREATININE  --  1.39*  < > 1.29*  < > 1.5* 1.89* 1.37*  CALCIUM  --  8.9  < > 8.8*  --   --  9.9 9.1  MG  --  2.1  --   --   --   --   --   --   PHOS 4.5  --   --   --   --   --   --   --   < > = values in this interval not displayed. Liver Function Tests:  Recent Labs  01/01/17 1046 02/07/17 1033  AST 44* 19  ALT 57* 19  ALKPHOS 82 73  BILITOT 1.1 0.5  PROT 8.2* 7.0  ALBUMIN 3.9 3.9   No results for input(s): LIPASE, AMYLASE in the last 8760 hours. No results for input(s): AMMONIA in the last 8760 hours. CBC:  Recent Labs  01/01/17 1046  01/06/17 1244 01/08/17 0422 02/04/17 1120  WBC 20.8*  < > 15.7* 10.8* 10.7  NEUTROABS 9.5*  --   --   --  6,955  HGB 10.4*  < > 9.1* 8.8* 11.7  HCT 33.6*  < > 28.7* 27.3* 36.8  MCV 94.1  < > 91.4 91.0 93.6  PLT 232  < > 219 225 267  < > = values in this interval not displayed. Lipid Panel:  Recent Labs  02/07/17 1033  CHOL 195  HDL 66  LDLCALC 106*  TRIG 113  CHOLHDL 3.0   TSH: No results for input(s): TSH in the last 8760 hours. A1C: Lab Results  Component Value Date   HGBA1C 5.5 01/01/2017     Assessment/Plan 1. Tremor -Neurology consult to evaluate for Parkinson's  2. Hypertensive heart disease with heart failure (HCC) -Stable today. Continue current regimen -Will refer her to CHF clinic for follow up maintenance  3. Coronary artery disease of native artery of native  heart with stable angina pectoris (HCC) -EKG completed and sent to cardiologist office for final evaluation,  -BMP and troponin completed  -Call back from Cards office to send patient to ED due to changes in EKG. -Imdur increased from 30mg  to 60mg  -Will call pt on home phone with results and Cards interpretation  -She may need to follow up with them before her May 24th appointment. -If CP worsens or continues, she will need to go to the ED for further evaluation.   Janene Harvey. Biagio Borg  [facility] [phone] 8 am - 5 pm) [phone] (after hours)

## 2017-03-22 NOTE — Telephone Encounter (Signed)
I spoke with Lorelle Formosa at [facility] ED to let her know that patient was coming to the ED by private vehicle and should arrive in about 30 minutes.   Lorelle Formosa was informed that patient had an ekg today and that after reviewing ekg, her cardiologist office recommended that patient be evaluated at ED. Lorelle Formosa verbalized acknowledgment.

## 2017-03-22 NOTE — H&P (Signed)
[facility] History and Physical  Kelly [name] UYE:[id number] DOB: [birth year] DOA: 03/22/2017  Referring physician: Dr Lita Mains PCP: Lauree Chandler, NP   Chief Complaint: Chest pain  HPI: Kelly [name] is a 83 y.o. female with SOB.  Pt went to see PCP today and reported SOB at rest and w/ exertion, also off and chest pains.  Was here in Jan w/ CHF and NSTEMI , had full w/u with clear coronaries by cath and low EF 35-40%, high filling pressures/ LVH and LBBB.  DC'd on bisoprolol and torsemide. Last 2-3 wks pt reports DOE primarily, no cough , no orthopnea, +ankle edema. Hasn't been weighing herself.  Doesn't drink excess fluids per dtr in law, but does eat a lot of fruit and has soup frequently.  No fevers, no abd or urinary c/o's.  Has CKD and is aware of that too.  No DM.      Old chart: Jan 18 - here visiting from [place] came in w/ SOB, hx COPD/ CKD/ LBBB/ HTN.  +NSTEMI w trop 4.9, also acute pulm edema, Kelly [name].  Acute resp failure felt due to COPD exac + pulm edema. Improved w IV lasix, treated emp abx.  dc'd on po pred, cont Advair and Ellipta.  7L negative after diuresis. EF 35-40%. NICM.   Heart cath showed normal coronaries, mod elevated LVEDP and pulm pressures.  Sig LVH on echo c/w restrictive physiology.  Prob demand ischemia.   Home meds > asa, advair, bisoprolol, fe, incruse ellipta, Imdur, KCl, sertrlaline, torsemide 20/d, vitmains    ROS  denies CP  no joint pain   no HA  no blurry vision  no rash  no diarrhea  no nausea/ vomiting  no dysuria  no difficulty voiding  no change in urine color    Past Medical History  Past Medical History:  Diagnosis Date  . CHF (congestive heart failure) ([place])   . CKD (chronic kidney disease)   . COPD (chronic obstructive pulmonary disease) ([place])   . Hyperlipidemia   . Hypertension   . LBBB (left bundle branch block)   . OSA (obstructive sleep apnea)    Past Surgical History  Past Surgical History:  Procedure  Laterality Date  . ABDOMINAL HYSTERECTOMY  1969  . APPENDECTOMY  1940  . CARDIAC CATHETERIZATION N/A 01/06/2017   Procedure: Right/Left Heart Cath and Coronary Angiography;  Surgeon: Leonie Man, MD;  Location: [place] CV LAB;  Service: Cardiovascular;  Laterality: N/A;  . carpal tunnel Right   . COLONOSCOPY  2008   Negative  . lower back Right    bone spur   Family History  Family History  Problem Relation Age of Onset  . Hypertension Father   . Stroke Father   . Kidney failure Mother   . Dementia Brother   . COPD Neg Hx   . Heart disease Neg Hx   . Diabetes Neg Hx    Social History  reports that she has quit smoking. She has never used smokeless tobacco. She reports that she does not drink alcohol or use drugs. Allergies No Known Allergies Home medications Prior to Admission medications   Medication Sig Start Date End Date Taking? Authorizing Provider  ADVAIR DISKUS 500-50 MCG/DOSE AEPB INHALE 1 PUFF INTO THE LUNGS 2 (TWO) TIMES DAILY. 02/28/17   Lauree Chandler, NP  aspirin 325 MG tablet Take 325 mg by mouth daily.    Historical Provider, MD  bisoprolol (ZEBETA) 5 MG tablet TAKE 1 TABLET (5 MG  TOTAL) BY MOUTH DAILY. 03/07/17   Lauree Chandler, NP  Cholecalciferol (VITAMIN D3) 1000 units CAPS Take 1,000 Units by mouth daily.    Historical Provider, MD  ferrous sulfate 325 (65 FE) MG EC tablet Take 325 mg by mouth 3 (three) times daily with meals.    Historical Provider, MD  INCRUSE ELLIPTA 62.5 MCG/INH AEPB INHALE 1 PUFF INTO THE LUNGS DAILY. 02/28/17   Lauree Chandler, NP  isosorbide mononitrate (IMDUR) 60 MG 24 hr tablet Take 1 tablet (60 mg total) by mouth daily. 03/22/17   Lauree Chandler, NP  potassium chloride SA (KLOR-CON M20) 20 MEQ tablet Take 1 tablet (20 mEq total) by mouth daily. 03/01/17   Lauree Chandler, NP  pravastatin (PRAVACHOL) 40 MG tablet Take 1 tablet (40 mg total) by mouth daily. 01/27/17   Lauree Chandler, NP  sertraline (ZOLOFT) 25 MG tablet  Take 1 tablet (25 mg total) by mouth daily. 02/07/17   Lauree Chandler, NP  torsemide (DEMADEX) 20 MG tablet Take 1 tablet (20 mg total) by mouth daily. 03/22/17   Lauree Chandler, NP  vitamin B-12 (CYANOCOBALAMIN) 1000 MCG tablet Take 1,000 mcg by mouth daily.    Historical Provider, MD   Liver Function Tests No results for input(s): AST, ALT, ALKPHOS, BILITOT, PROT, ALBUMIN in the last 168 hours. No results for input(s): LIPASE, AMYLASE in the last 168 hours. CBC  Recent Labs Lab 03/22/17 1453  WBC 8.6  HGB 10.5*  HCT 33.4*  MCV 93.3  PLT 789   Basic Metabolic Panel  Recent Labs Lab 03/22/17 1453  NA 143  K 3.5  CL 103  CO2 24  GLUCOSE 99  BUN 35*  CREATININE 1.73*  CALCIUM 9.3     Vitals:   03/22/17 1715 03/22/17 1730 03/22/17 1745 03/22/17 1800  BP: (!) 152/63 (!) 145/62 (!) 150/80 (!) 146/62  Pulse: (!) 57 (!) 57 (!) 58 (!) 57  Resp: 15 19 (!) 21 15  Temp:      TempSrc:      SpO2: 100% 99% 100% 98%  Weight:      Height:       Exam: Gen alert, no distress, calm, quiet No rash, cyanosis or gangrene Sclera anicteric, throat clear  No jvd or bruits Chest occ basilar crackles, mostly clear RRR no MRG Abd soft ntnd no mass or ascites +bs GU defer MS no joint effusions or deformity Ext 1+ pitting pretib edema / no wounds or ulcers Neuro is alert, Ox 3 , nf   Na 143 K 3.5  Cr 1.73  BUN 35  CO2 24   eGFR 30   Trop 0.76 WBC 8  Hb 10.5  ptl 192 Baseline creat 1.3- 1.6  EKG (independ reviewed) >  Sinus bradycardia with Premature atrial complexes Left axis deviation Left bundle branch block NO change from prior EKG in Jan 2018  Home meds > asa, advair, bisoprolol 5/d, fe, incruse ellipta, Imdur, KCl, sertrlaline, torsemide 20/d, vitmains  CXR (independ reviewed) > CM w vasc/ IS prominence, suspected early CHF   Assessment: 1. Dyspnea/decomp systolic + diast CHF - plan admit, IV lasix, diurese 2. Chest pain - normal cor's by recent cath, prob due  to #1 3. Acute / CKD3 - may improve w diuresis 4. HTN  5. COPD - cont inhalers 6. LBBB  Plan - as above     [place] D [facility] Pager [phone]   If 7PM-7AM, please contact night-coverage [email or web address]  Password [email or web address] 03/22/2017, 6:51 PM

## 2017-03-22 NOTE — Addendum Note (Signed)
Addended by: Chriss Driver on: 03/22/2017 02:48 PM   Modules accepted: Orders

## 2017-03-22 NOTE — Telephone Encounter (Signed)
Received call from Shanda Bumps NP at [facility]. Care-states she faxed over EKG for Dr. Duke Salvia to review and Dr. Duke Salvia recommended ER eval (see telephone note).    Reports patient was not currently having CP, CP is intermittent with SOB.  Report increasing her Imdur to 60mg  as this medication has helped her CP since starting it.  Wondering if she needs sooner f/u than May.    Spoke with Dr. Duke Salvia who still recommends ER evaluation at this time.    Attempt to contact patient-no answer, lmtcb.  Called husband (ok per DPR)-not avlaiable.    Called Shanda Bumps NP-made aware and will try to contact patient.  Advised patient was in this morning and is not still in office.   Will continue to try to contact patient to inform of recommendations.

## 2017-03-22 NOTE — ED Provider Notes (Signed)
[facility]-EMERGENCY DEPT Provider Note   CSN: [id number] Arrival date & time: 03/22/17  1442     History   Chief Complaint Chief Complaint  Patient presents with  . Chest Pain    HPI Kelly [name] is a 83 y.o. female.  HPI   Patient is a 83 year old female with history of CKD, left bundle branch block, CHF, hypertension, hyperlipidemia, COPD who presents to the ED from PCP office due to complaints of chest pain. Patient reports she has been having intermittent episodes of sharp left-sided chest pain for the past month. She has the chest pain lasts only a few seconds and denies any aggravating or alleviating factors. Denies radiation. Patient reports she has tried taking nitroglycerin at home without relief. She notes her last episode of chest pain occurred last night around 8 PM while she was laying in bed. Denies having any episodes of chest pain today. Patient states she was seen at her PCP office today for follow-up and was noted to have changes in her EKG that were reviewed by cardiology resulting in the patient being sent to the ED for further evaluation. Patient also reports having mild shortness of breath over the past few months. She also reports having swelling to her lower legs which is chronic. Patient states she has been taking all of her home medications as prescribed. Denies fever, chills, headache, lightheadedness, dizziness, cough, wheezing, palpitations, diaphoresis, abdominal pain, nausea, vomiting, diarrhea, urinary symptoms, numbness, tingling, weakness.  PCP- Abbey Chatters, NP Cardiologist- Dr. [place]  Past Medical History:  Diagnosis Date  . CHF (congestive heart failure) (HCC)   . CKD (chronic kidney disease)   . COPD (chronic obstructive pulmonary disease) (HCC)   . Hyperlipidemia   . Hypertension   . LBBB (left bundle branch block)   . OSA (obstructive sleep apnea)     Patient Active Problem List   Diagnosis Date Noted  . Acute on chronic combined  systolic and diastolic CHF (congestive heart failure) (HCC) 02/04/2017  . Sleep apnea 01/11/2017  . Nonischemic congestive cardiomyopathy (HCC) 01/07/2017  . Acute renal failure superimposed on stage 3 chronic kidney disease (HCC) 01/07/2017  . Congestive dilated cardiomyopathy (HCC)   . Hypertensive heart disease 01/03/2017  . NSTEMI (non-ST elevated myocardial infarction) (HCC)   . COPD with acute exacerbation (HCC) 01/01/2017  . LBBB (left bundle branch block) 01/01/2017  . Acute respiratory failure with hypercapnia (HCC) 01/01/2017  . Troponin I above reference range     Past Surgical History:  Procedure Laterality Date  . ABDOMINAL HYSTERECTOMY  1969  . APPENDECTOMY  1940  . CARDIAC CATHETERIZATION N/A 01/06/2017   Procedure: Right/Left Heart Cath and Coronary Angiography;  Surgeon: Marykay Lex, MD;  Location: [facility] INVASIVE CV LAB;  Service: Cardiovascular;  Laterality: N/A;  . carpal tunnel Right   . COLONOSCOPY  2008   Negative  . lower back Right    bone spur    OB History    No data available       Home Medications    Prior to Admission medications   Medication Sig Start Date End Date Taking? Authorizing Provider  ADVAIR DISKUS 500-50 MCG/DOSE AEPB INHALE 1 PUFF INTO THE LUNGS 2 (TWO) TIMES DAILY. 02/28/17   Sharon Seller, NP  aspirin 325 MG tablet Take 325 mg by mouth daily.    Historical Provider, MD  bisoprolol (ZEBETA) 5 MG tablet TAKE 1 TABLET (5 MG TOTAL) BY MOUTH DAILY. 03/07/17   Sharon Seller, NP  Cholecalciferol (VITAMIN D3) 1000 units CAPS Take 1,000 Units by mouth daily.    Historical Provider, MD  ferrous sulfate 325 (65 FE) MG EC tablet Take 325 mg by mouth 3 (three) times daily with meals.    Historical Provider, MD  INCRUSE ELLIPTA 62.5 MCG/INH AEPB INHALE 1 PUFF INTO THE LUNGS DAILY. 02/28/17   Sharon Seller, NP  isosorbide mononitrate (IMDUR) 60 MG 24 hr tablet Take 1 tablet (60 mg total) by mouth daily. 03/22/17   Sharon Seller, NP    potassium chloride SA (KLOR-CON M20) 20 MEQ tablet Take 1 tablet (20 mEq total) by mouth daily. 03/01/17   Sharon Seller, NP  pravastatin (PRAVACHOL) 40 MG tablet Take 1 tablet (40 mg total) by mouth daily. 01/27/17   Sharon Seller, NP  sertraline (ZOLOFT) 25 MG tablet Take 1 tablet (25 mg total) by mouth daily. 02/07/17   Sharon Seller, NP  torsemide (DEMADEX) 20 MG tablet Take 1 tablet (20 mg total) by mouth daily. 03/22/17   Sharon Seller, NP  vitamin B-12 (CYANOCOBALAMIN) 1000 MCG tablet Take 1,000 mcg by mouth daily.    Historical Provider, MD    Family History Family History  Problem Relation Age of Onset  . Hypertension Father   . Stroke Father   . Kidney failure Mother   . Dementia Brother   . COPD Neg Hx   . Heart disease Neg Hx   . Diabetes Neg Hx     Social History Social History  Substance Use Topics  . Smoking status: Former Games developer  . Smokeless tobacco: Never Used  . Alcohol use No     Allergies   Patient has no known allergies.   Review of Systems Review of Systems  Respiratory: Positive for shortness of breath.   Cardiovascular: Positive for chest pain (intermittent) and leg swelling (chronic).  All other systems reviewed and are negative.    Physical Exam Updated Vital Signs BP (!) 146/62   Pulse (!) 57   Temp 98.3 F (36.8 C) (Oral)   Resp 15   Ht 5\' 6"  (1.676 m)   Wt 81.6 kg   SpO2 98%   BMI 29.05 kg/m   Physical Exam  Constitutional: She is oriented to person, place, and time. She appears well-developed and well-nourished.  HENT:  Head: Normocephalic and atraumatic.  Mouth/Throat: Oropharynx is clear and moist. No oropharyngeal exudate.  Eyes: Conjunctivae and EOM are normal. Pupils are equal, round, and reactive to light. Right eye exhibits no discharge. Left eye exhibits no discharge. No scleral icterus.  Neck: Normal range of motion. Neck supple.  Cardiovascular: Normal rate, regular rhythm, normal heart sounds and intact  distal pulses.   Pulmonary/Chest: Effort normal. No respiratory distress. She has no wheezes. She has rales. She exhibits no tenderness.  Mild crackles present to bibasilar lobes  Abdominal: Soft. Bowel sounds are normal. She exhibits no distension and no mass. There is no tenderness. There is no rebound and no guarding. No hernia.  Musculoskeletal: Normal range of motion. She exhibits edema. She exhibits no tenderness.  1+ pitting edema noted to BLE extending up to mid-shin, which pt reports is chronic.  Neurological: She is alert and oriented to person, place, and time.  Skin: Skin is warm and dry.  Nursing note and vitals reviewed.    ED Treatments / Results  Labs (all labs ordered are listed, but only abnormal results are displayed) Labs Reviewed  BASIC METABOLIC PANEL - Abnormal; Notable  for the following:       Result Value   BUN 35 (*)    Creatinine, Ser 1.73 (*)    GFR calc non Af Amer 26 (*)    GFR calc Af Amer 30 (*)    Anion gap 16 (*)    All other components within normal limits  CBC - Abnormal; Notable for the following:    RBC 3.58 (*)    Hemoglobin 10.5 (*)    HCT 33.4 (*)    RDW 16.2 (*)    All other components within normal limits  I-STAT TROPOININ, ED - Abnormal; Notable for the following:    Troponin i, poc 0.76 (*)    All other components within normal limits  BRAIN NATRIURETIC PEPTIDE    EKG  EKG Interpretation  Date/Time:  Tuesday March 22 2017 14:47:29 EDT Ventricular Rate:  56 PR Interval:  178 QRS Duration: 126 QT Interval:  490 QTC Calculation: 472 R Axis:   -61 Text Interpretation:  Sinus bradycardia with Premature atrial complexes Left axis deviation Left bundle branch block Abnormal ECG Confirmed by YELVERTON  MD, DAVID ([id number]) on 03/22/2017 4:36:37 PM       Radiology Dg Chest 2 View  Result Date: 03/22/2017 CLINICAL DATA:  Chest pain, chronic kidney disease, history of CHF, hypertension, hyperlipidemia and COPD EXAM: CHEST  2 VIEW  COMPARISON:  None available FINDINGS: Marked cardiomegaly with central vascular congestion and diffuse interstitial prominence suggesting volume overload versus developing early edema. Streaky bibasilar bronchovascular opacities, suspect atelectasis but difficult to exclude mild basilar bronchopneumonia. These changes are worse in the left lower lobe. No large effusion or pneumothorax. Atherosclerosis noted of the aorta. Trachea is midline. Diffuse thoracic spondylosis. No acute compression fracture. IMPRESSION: Cardiomegaly with vascular and interstitial prominence suggesting mild volume overload versus early developing edema. Mild streaky bibasilar bronchovascular opacities, worse in the left lower lobe may represent atelectasis versus basilar bronchopneumonia. No significant effusion or pneumothorax Thoracic aortic atherosclerosis Electronically Signed   By: Judie Petit.  Shick M.D.   On: 03/22/2017 15:51    Procedures Procedures (including critical care time)  Medications Ordered in ED Medications - No data to display   Initial Impression / Assessment and Plan / ED Course  I have reviewed the triage vital signs and the nursing notes.  Pertinent labs & imaging results that were available during my care of the patient were reviewed by me and considered in my medical decision making (see chart for details).     Patient is a 83 year old female who presents the ED with complaint of intermittent chest pain and shortness of breath for the past few weeks. PMH of CKD, left bundle branch block, CHF, hypertension, hyperlipidemia, COPD. Denies fever, cough. Pt sent from PCP office due to concern for new changes in EKG performed in office PTA. VSS. On exam patient is alert, nontoxic appearing and appears to be in no acute distress. Lung exam revealed mild crackles present in by basilar lobes, patient without signs of increased sugar breathing or restrictive distress. 1+ pitting edema present to bilateral lower  extremities which patient reports is chronic and unchanged. Remaining exam unremarkable.  Chart review shows patient was admitted to the ED on 01/01/17 for acute respiratory failure with hypoxia secondary to CHF. Patient was noted to have NSTEMI with elevated troponin up to 4. Echo showed EF 35-40% with severe LVH and restrictive parameters. Cardiac cath showed pulmonary hypertension with elevated filling pressures, normal coronary anatomy. Patient was started on bisporolol and  torsemide.  EKG showed sinus bradycardia, HR 56, with left axis deviation and LBBB, no acute ischemic changes and no significant changes from prior. Trop 0.76. BUN 35, Cr 1.73. CXR showed cardiomegaly with vascular and interstitial prominence suggesting mild volume overload. Discussed pt with Dr. Ranae Palms who also evaluated pt in the ED. Consulted cardiology. Dr. Mayford Knife recommended admitting pt to medicine for atypical CP with plan of delta trops and echo. Consulted hospitalist who agrees to admission. Discussed results and plan for admission with pt and family.  Final Clinical Impressions(s) / ED Diagnoses   Final diagnoses:  Atypical chest pain    New Prescriptions New Prescriptions   No medications on file     Barrett Henle, PA-C 03/22/17 1855    Loren Racer, MD 03/24/17 [phone]

## 2017-03-22 NOTE — Telephone Encounter (Signed)
Dr Duke Salvia reviewed EKG and recommended patient go to ED if currently having chest pains for evaluation, advised Aurther Loft

## 2017-03-22 NOTE — Telephone Encounter (Signed)
I spoke with Kelly [name] at [name] regarding EKG done today while patient's OV. The EKG was faxed to DR. Brook at [phone] for review.   Awaiting response.

## 2017-03-22 NOTE — Telephone Encounter (Signed)
I received a call from [facility] from [facility] about the EKG that was faxed. Melissa stated that Dr. Duke Salvia recommended that patient go to ED for evaluation due to chest pain and abnormal EKG.   I called patient and she stated that she would go to ED when her son arrived. She stated that she did not want me to call the hospital triage line because she has no idea what time she will be going.

## 2017-03-22 NOTE — Telephone Encounter (Signed)
Received message from [place] NP-patient aware of recommendations and son will take to ER.

## 2017-03-22 NOTE — Telephone Encounter (Signed)
New message      Calling to fax over an EKG for the doctor to look at while the pt is still at their office.  Pt c/o persistent intermediate chest pain for the last 2 weeks.  They will keep pt there until they hear from you

## 2017-03-22 NOTE — Patient Instructions (Addendum)
Increase IMDUR to 60 mg daily Will refer you to neurology due to possible parkinson's Cont Demadex 20 mg daily

## 2017-03-22 NOTE — ED Triage Notes (Signed)
Pt presents to the ed with complaints of chest pain for 2 weeks. She was at her primary doctor for a routine visit today and had the same complaints. They did an ekg and sent it to her cardiologist who told her to come here. Pt denies pain or any other symptoms at this time.

## 2017-03-22 NOTE — ED Notes (Signed)
ED Provider at bedside. 

## 2017-03-22 NOTE — ED Notes (Signed)
Positive Troponin reported by phlebotomy. Pt to move to next available open room.

## 2017-03-23 ENCOUNTER — Telehealth: Payer: Self-pay | Admitting: *Deleted

## 2017-03-23 ENCOUNTER — Encounter (HOSPITAL_COMMUNITY): Payer: Self-pay | Admitting: General Practice

## 2017-03-23 LAB — BASIC METABOLIC PANEL
Anion gap: 15 (ref 5–15)
BUN: 31 mg/dL — AB (ref 6–20)
CHLORIDE: 104 mmol/L (ref 101–111)
CO2: 25 mmol/L (ref 22–32)
Calcium: 9.2 mg/dL (ref 8.9–10.3)
Creatinine, Ser: 1.36 mg/dL — ABNORMAL HIGH (ref 0.44–1.00)
GFR calc non Af Amer: 35 mL/min — ABNORMAL LOW (ref >60)
GFR, EST AFRICAN AMERICAN: 40 mL/min — AB (ref >60)
Glucose, Bld: 94 mg/dL (ref 65–99)
POTASSIUM: 3 mmol/L — AB (ref 3.5–5.1)
SODIUM: 144 mmol/L (ref 135–145)

## 2017-03-23 LAB — CBC
HEMATOCRIT: 32.5 % — AB (ref 36.0–46.0)
HEMOGLOBIN: 10.3 g/dL — AB (ref 12.0–15.0)
MCH: 29.7 pg (ref 26.0–34.0)
MCHC: 31.7 g/dL (ref 30.0–36.0)
MCV: 93.7 fL (ref 78.0–100.0)
Platelets: 155 10*3/uL (ref 150–400)
RBC: 3.47 MIL/uL — AB (ref 3.87–5.11)
RDW: 16.4 % — ABNORMAL HIGH (ref 11.5–15.5)
WBC: 8.2 10*3/uL (ref 4.0–10.5)

## 2017-03-23 LAB — TROPONIN I: TROPONIN I: 5.52 ng/mL — AB (ref <=0.05)

## 2017-03-23 LAB — TSH: TSH: 0.669 u[IU]/mL (ref 0.350–4.500)

## 2017-03-23 MED ORDER — ISOSORBIDE MONONITRATE ER 60 MG PO TB24
60.0000 mg | ORAL_TABLET | Freq: Every day | ORAL | Status: DC
  Administered 2017-03-23: 60 mg via ORAL
  Filled 2017-03-23: qty 1

## 2017-03-23 MED ORDER — ENOXAPARIN SODIUM 40 MG/0.4ML ~~LOC~~ SOLN
40.0000 mg | SUBCUTANEOUS | Status: DC
  Administered 2017-03-23 – 2017-03-24 (×2): 40 mg via SUBCUTANEOUS
  Filled 2017-03-23 (×2): qty 0.4

## 2017-03-23 MED ORDER — ASPIRIN 325 MG PO TABS
325.0000 mg | ORAL_TABLET | Freq: Every day | ORAL | Status: DC
  Administered 2017-03-23 – 2017-03-25 (×3): 325 mg via ORAL
  Filled 2017-03-23 (×3): qty 1

## 2017-03-23 MED ORDER — VITAMIN B-12 1000 MCG PO TABS
1000.0000 ug | ORAL_TABLET | Freq: Every day | ORAL | Status: DC
  Administered 2017-03-23 – 2017-03-25 (×3): 1000 ug via ORAL
  Filled 2017-03-23 (×3): qty 1

## 2017-03-23 MED ORDER — UMECLIDINIUM BROMIDE 62.5 MCG/INH IN AEPB
1.0000 | INHALATION_SPRAY | Freq: Every day | RESPIRATORY_TRACT | Status: DC
Start: 2017-03-23 — End: 2017-03-25
  Administered 2017-03-23 – 2017-03-25 (×3): 1 via RESPIRATORY_TRACT
  Filled 2017-03-23: qty 7

## 2017-03-23 MED ORDER — SERTRALINE HCL 50 MG PO TABS
25.0000 mg | ORAL_TABLET | Freq: Every day | ORAL | Status: DC
  Administered 2017-03-23 – 2017-03-25 (×3): 25 mg via ORAL
  Filled 2017-03-23 (×3): qty 1

## 2017-03-23 MED ORDER — POTASSIUM CHLORIDE CRYS ER 20 MEQ PO TBCR
20.0000 meq | EXTENDED_RELEASE_TABLET | Freq: Every day | ORAL | Status: DC
  Administered 2017-03-23: 20 meq via ORAL
  Filled 2017-03-23: qty 1

## 2017-03-23 MED ORDER — HYDRALAZINE HCL 50 MG PO TABS
50.0000 mg | ORAL_TABLET | Freq: Three times a day (TID) | ORAL | Status: DC
  Administered 2017-03-23 – 2017-03-24 (×3): 50 mg via ORAL
  Filled 2017-03-23 (×3): qty 1

## 2017-03-23 MED ORDER — POTASSIUM CHLORIDE CRYS ER 20 MEQ PO TBCR
40.0000 meq | EXTENDED_RELEASE_TABLET | Freq: Every day | ORAL | Status: DC
  Filled 2017-03-23: qty 2

## 2017-03-23 MED ORDER — FUROSEMIDE 10 MG/ML IJ SOLN
40.0000 mg | Freq: Two times a day (BID) | INTRAMUSCULAR | Status: DC
Start: 2017-03-23 — End: 2017-03-24
  Administered 2017-03-23 – 2017-03-24 (×3): 40 mg via INTRAVENOUS
  Filled 2017-03-23 (×3): qty 4

## 2017-03-23 MED ORDER — MOMETASONE FURO-FORMOTEROL FUM 200-5 MCG/ACT IN AERO
2.0000 | INHALATION_SPRAY | Freq: Two times a day (BID) | RESPIRATORY_TRACT | Status: DC
  Administered 2017-03-23 – 2017-03-25 (×4): 2 via RESPIRATORY_TRACT
  Filled 2017-03-23: qty 8.8

## 2017-03-23 MED ORDER — VITAMIN D 1000 UNITS PO TABS
1000.0000 [IU] | ORAL_TABLET | Freq: Every day | ORAL | Status: DC
  Administered 2017-03-23 – 2017-03-25 (×3): 1000 [IU] via ORAL
  Filled 2017-03-23 (×3): qty 1

## 2017-03-23 MED ORDER — PRAVASTATIN SODIUM 40 MG PO TABS
40.0000 mg | ORAL_TABLET | Freq: Every day | ORAL | Status: DC
  Administered 2017-03-23 – 2017-03-24 (×2): 40 mg via ORAL
  Filled 2017-03-23 (×2): qty 1

## 2017-03-23 MED ORDER — BISOPROLOL FUMARATE 5 MG PO TABS
5.0000 mg | ORAL_TABLET | Freq: Every day | ORAL | Status: DC
  Administered 2017-03-23: 5 mg via ORAL
  Filled 2017-03-23: qty 1

## 2017-03-23 MED ORDER — FERROUS SULFATE 325 (65 FE) MG PO TABS
325.0000 mg | ORAL_TABLET | Freq: Every day | ORAL | Status: DC
  Administered 2017-03-23 – 2017-03-25 (×3): 325 mg via ORAL
  Filled 2017-03-23 (×3): qty 1

## 2017-03-23 MED ORDER — SODIUM CHLORIDE 0.9% FLUSH
3.0000 mL | Freq: Two times a day (BID) | INTRAVENOUS | Status: DC
  Administered 2017-03-23 – 2017-03-25 (×6): 3 mL via INTRAVENOUS

## 2017-03-23 MED ORDER — ENOXAPARIN SODIUM 30 MG/0.3ML ~~LOC~~ SOLN: 30.0000 mg | SUBCUTANEOUS | Status: DC

## 2017-03-23 MED ORDER — POTASSIUM CHLORIDE CRYS ER 20 MEQ PO TBCR
40.0000 meq | EXTENDED_RELEASE_TABLET | Freq: Four times a day (QID) | ORAL | Status: AC
  Administered 2017-03-23 (×2): 40 meq via ORAL
  Filled 2017-03-23 (×2): qty 2

## 2017-03-23 MED ORDER — FERROUS SULFATE 325 (65 FE) MG PO TABS: 325.0000 mg | ORAL_TABLET | Freq: Every day | ORAL | Status: DC

## 2017-03-23 NOTE — Progress Notes (Signed)
Pt. Arrived to the unit from ED via stretcher.Pt. Alert and oriented and in stable condition.  No s/s of distress or discomfort noted. Pt. Denies pain. Pt. Placed on telemetry. CCMD notified. Call light placed within reach. RN will continue to monitor pt. For changes in condition. Kelly [name], Cheryll Dessert

## 2017-03-23 NOTE — Telephone Encounter (Signed)
Kelly [name] with [facility] called with Lab Alert on patient's Troponin Level of 5.52.  Patient was sent to hospital yesterday and admitted.

## 2017-03-23 NOTE — Evaluation (Signed)
Physical Therapy Evaluation Patient Details Name: Kelly [name] MRN: [id number] DOB: 11-Jul-1931 Today's Date: 03/23/2017   History of Present Illness  Kelly [name] is a 83 y.o. female with SOB.  Pt went to see PCP today and reported SOB at rest and w/ exertion, also off and chest pains.   Clinical Impression  Patient evaluated by Physical Therapy with no further acute PT needs identified. All education has been completed and the patient has no further questions. Pt ambulated 300' with RW mod I, requests RW for home use, PT agreed would be helpful to her. O2 sats 95% on RA, HR low 60's with ambulation, 49 bpm sinus brady at rest. Advised pt to ambulate in hallway 3-5x/ day until d/c home.  See below for any follow-up Physical Therapy or equipment needs. PT is signing off. Thank you for this referral.     Follow Up Recommendations No PT follow up    Equipment Recommendations  Rolling walker with 5" wheels    Recommendations for Other Services       Precautions / Restrictions Precautions Precautions: None Restrictions Weight Bearing Restrictions: No      Mobility  Bed Mobility Overal bed mobility: Modified Independent                Transfers Overall transfer level: Modified independent Equipment used: Rolling walker (2 wheeled)             General transfer comment: vc's for safe use of RW since she does not use at home. She requests one for home for says when her energy level is lower  Ambulation/Gait Ambulation/Gait assistance: Modified independent (Device/Increase time) Ambulation Distance (Feet): 300 Feet Assistive device: Rolling walker (2 wheeled) Gait Pattern/deviations: Step-through pattern   Gait velocity interpretation: at or above normal speed for age/gender General Gait Details: safe ambulation with RW  Stairs            Wheelchair Mobility    Modified Rankin (Stroke Patients Only)       Balance Overall balance assessment: No  apparent balance deficits (not formally assessed)                                           Pertinent Vitals/Pain Pain Assessment: No/denies pain    Home Living Family/patient expects to be discharged to:: Private residence Living Arrangements: Children Available Help at Discharge: Family;Available PRN/intermittently Type of Home: House Home Access: Level entry     Home Layout: Two level;Bed/bath upstairs Home Equipment: Cane - single point Additional Comments: pt reports that the stairs wear her out but she is waiting to get into assisted living and does not know how long that will be    Prior Function Level of Independence: Independent with assistive device(s)         Comments: uses [facility], family cooks and cleans but she cares for herself     Hand Dominance   Dominant Hand: Right    Extremity/Trunk Assessment   Upper Extremity Assessment Upper Extremity Assessment: Overall WFL for tasks assessed    Lower Extremity Assessment Lower Extremity Assessment: Overall WFL for tasks assessed    Cervical / Trunk Assessment Cervical / Trunk Assessment: Normal  Communication   Communication: No difficulties  Cognition Arousal/Alertness: Awake/alert Behavior During Therapy: WFL for tasks assessed/performed Overall Cognitive Status: Within Functional Limits for tasks assessed  General Comments General comments (skin integrity, edema, etc.): advised pt to ambulate in hall 3-5x/ day with RW until d/c home    Exercises     Assessment/Plan    PT Assessment Patent does not need any further PT services  PT Problem List         PT Treatment Interventions      PT Goals (Current goals can be found in the Care Plan section)  Acute Rehab PT Goals Patient Stated Goal: return home PT Goal Formulation: All assessment and education complete, DC therapy    Frequency     Barriers to discharge         Co-evaluation               End of Session Equipment Utilized During Treatment: Gait belt Activity Tolerance: Patient tolerated treatment well Patient left: in chair;with call bell/phone within reach Nurse Communication: Mobility status PT Visit Diagnosis: Muscle weakness (generalized) (M62.81)    Time: 7106-2694 PT Time Calculation (min) (ACUTE ONLY): 16 min   Charges:   PT Evaluation $PT Eval Low Complexity: 1 Procedure     PT G Codes:        Lyanne Co, PT  Acute Rehab Services  [phone]   [place] L Marlyn Rabine 03/23/2017, 12:38 PM

## 2017-03-23 NOTE — Telephone Encounter (Signed)
Aware. 

## 2017-03-23 NOTE — Progress Notes (Addendum)
PROGRESS NOTE                                                                                                                                                                                                             Patient Demographics:    Kelly [name], is a 83 y.o. female, DOB - 1931-01-10, FBP:[id number]  Admit date - 03/22/2017   Admitting Physician Delano Metz, MD  Outpatient Primary MD for the patient is Sharon Seller, NP  LOS - 1  Chief Complaint  Patient presents with  . Chest Pain       Brief Narrative   Kelly [name] is a 83 y.o. female with SOB.  Pt went to see PCP today and reported SOB at rest and w/ exertion, also off and chest pains.  Was here in Jan w/ CHF and NSTEMI , had full w/u with clear coronaries by cath and low EF 35-40%, high filling pressures/ LVH and LBBB, Was admitted for CHF and mild bradycardia.   Subjective:    Kelly [name] today has, No headache, No chest pain, No abdominal pain - No Nausea, No new weakness tingling or numbness, No Cough, improved SOB.     Assessment  & Plan :     1.Acute on chronic combined systolic and diastolic heart failure EF around 40%. Continue IV Lasix along with Imdur, unfortunately beta blocker was discontinued today as she had episodes of bradycardia into low 40s multiple times. We'll request cardiology to evaluate and whether to see if beta blocker is okay to continue in the light of moderate bradycardia. Placed on fluid restriction along with salt restriction, continue monitoring daily weight, intake output and BMP.  2. ARF and CK D3. Improving with diuresis due to CHF decompensation.  3. COPD. At baseline now wheezing, continue supportive care.  4. History of left bundle branch block. Stable.  5. Hypertension. Currently on Imdur and diuretic, will add Hydralazine.  6.Hypokalemia. Replaced monitor.    Diet : Diet Heart Room  service appropriate? Yes; Fluid consistency: Thin    Family Communication  :  None  Code Status :  Full  Disposition Plan  :  TBD  Consults  :  Cards  Procedures  :    DVT Prophylaxis  :  Lovenox    Lab Results  Component Value Date  PLT 155 03/23/2017    Inpatient Medications  Scheduled Meds: . aspirin  325 mg Oral Daily  . bisoprolol  5 mg Oral Daily  . cholecalciferol  1,000 Units Oral Daily  . enoxaparin (LOVENOX) injection  40 mg Subcutaneous Q24H  . ferrous sulfate  325 mg Oral Q breakfast  . furosemide  40 mg Intravenous Q12H  . isosorbide mononitrate  60 mg Oral Daily  . mometasone-formoterol  2 puff Inhalation BID  . potassium chloride SA  20 mEq Oral Daily  . pravastatin  40 mg Oral Daily  . sertraline  25 mg Oral Daily  . sodium chloride flush  3 mL Intravenous Q12H  . umeclidinium bromide  1 puff Inhalation Daily  . vitamin B-12  1,000 mcg Oral Daily   Continuous Infusions: PRN Meds:.  Antibiotics  :    Anti-infectives    None         Objective:   Vitals:   03/23/17 0137 03/23/17 0703 03/23/17 0851 03/23/17 1041  BP: (!) 157/61 (!) 129/50  (!) 140/52  Pulse: (!) 52 (!) 45 60 (!) 46  Resp: (!) 22 18 16    Temp: 98.1 F (36.7 C) 97.3 F (36.3 C)    TempSrc: Oral Oral    SpO2: 100% 99% 96%   Weight: 79.5 kg (175 lb 3.2 oz)     Height: 5\' 7"  (1.702 m)       Wt Readings from Last 3 Encounters:  03/23/17 79.5 kg (175 lb 3.2 oz)  03/22/17 82 kg (180 lb 12.8 oz)  03/01/17 83.4 kg (183 lb 12.8 oz)     Intake/Output Summary (Last 24 hours) at 03/23/17 1043 Last data filed at 03/23/17 0900  Gross per 24 hour  Intake              220 ml  Output              800 ml  Net             -580 ml     Physical Exam  Awake Alert, Oriented X 3, No new F.N deficits, Normal affect [place].AT,PERRAL Supple Neck,No JVD, No cervical lymphadenopathy appriciated.  Symmetrical Chest wall movement, Good air movement bilaterally, few rales RRR,No  Gallops,Rubs or new Murmurs, No Parasternal Heave +ve B.Sounds, Abd Soft, No tenderness, No organomegaly appriciated, No rebound - guarding or rigidity. No Cyanosis, Clubbing , trace edema, No new Rash or bruise       Data Review:    CBC  Recent Labs Lab 03/22/17 1453 03/23/17 0305  WBC 8.6 8.2  HGB 10.5* 10.3*  HCT 33.4* 32.5*  PLT 192 155  MCV 93.3 93.7  MCH 29.3 29.7  MCHC 31.4 31.7  RDW 16.2* 16.4*    Chemistries   Recent Labs Lab 03/22/17 1123 03/22/17 1453 03/23/17 0305  NA 145 143 144  K 3.6 3.5 3.0*  CL 107 103 104  CO2 22 24 25   GLUCOSE 108* 99 94  BUN 36* 35* 31*  CREATININE 1.76* 1.73* 1.36*  CALCIUM 9.0 9.3 9.2   ------------------------------------------------------------------------------------------------------------------ No results for input(s): CHOL, HDL, LDLCALC, TRIG, CHOLHDL, LDLDIRECT in the last 72 hours.  Lab Results  Component Value Date   HGBA1C 5.5 01/01/2017   ------------------------------------------------------------------------------------------------------------------ No results for input(s): TSH, T4TOTAL, T3FREE, THYROIDAB in the last 72 hours.  Invalid input(s): FREET3 ------------------------------------------------------------------------------------------------------------------ No results for input(s): VITAMINB12, FOLATE, FERRITIN, TIBC, IRON, RETICCTPCT in the last 72 hours.  Coagulation profile No results for input(s):  INR, PROTIME in the last 168 hours.  No results for input(s): DDIMER in the last 72 hours.  Cardiac Enzymes  Recent Labs Lab 03/22/17 1115  TROPONINI 5.52*   ------------------------------------------------------------------------------------------------------------------    Component Value Date/Time   BNP 500.8 (H) 02/04/2017 1120    Micro Results No results found for this or any previous visit (from the past 240 hour(s)).  Radiology Reports Dg Chest 2 View  Result Date:  03/22/2017 CLINICAL DATA:  Chest pain, chronic kidney disease, history of CHF, hypertension, hyperlipidemia and COPD EXAM: CHEST  2 VIEW COMPARISON:  None available FINDINGS: Marked cardiomegaly with central vascular congestion and diffuse interstitial prominence suggesting volume overload versus developing early edema. Streaky bibasilar bronchovascular opacities, suspect atelectasis but difficult to exclude mild basilar bronchopneumonia. These changes are worse in the left lower lobe. No large effusion or pneumothorax. Atherosclerosis noted of the aorta. Trachea is midline. Diffuse thoracic spondylosis. No acute compression fracture. IMPRESSION: Cardiomegaly with vascular and interstitial prominence suggesting mild volume overload versus early developing edema. Mild streaky bibasilar bronchovascular opacities, worse in the left lower lobe may represent atelectasis versus basilar bronchopneumonia. No significant effusion or pneumothorax Thoracic aortic atherosclerosis Electronically Signed   By: Judie Petit.  Shick M.D.   On: 03/22/2017 15:51    Time Spent in minutes  30   Susa Raring M.D on 03/23/2017 at 10:43 AM  Between 7am to 7pm - Pager - [phone] ( page via [facility], text pages only, please mention full 10 digit call back number).  After 7pm go to [email or web address] - password [facility]

## 2017-03-23 NOTE — Consult Note (Signed)
CARDIOLOGY CONSULT NOTE   Patient ID: Kelly [name] MRN: [id number] DOB/AGE: Nov 23, 1931 83 y.o.  Admit date: 03/22/2017  Requesting Physician: Dr. Thedore Mins, Demetrius Charity Primary Physician:   Sharon Seller, NP Primary Cardiologist:   Dr. [place], Demetrius Charity. Reason for Consultation:   Heat failure and bradycardia  HPI: Kelly [name] is a 83 y.o. female with a history of acute combined systolic and diastolic CHF, hx of COPD, apparent old LBBB, reported history of "3- heart attacks" in the past. Was living and followed in [place] for previous cardiac events. Recently admitted for acute respitratory failure with hypoxia secondary to acute combined systolic and diastolic CHF (1/20 to 01/08/2017). Recent cath on 01/06/2017 showed normal coronary arteries, pulmonary hypertension and elevated filling pressures. Echo showed EF 35-40%, severe LVH and restrictive parameters.   Kelly [name] is a 83 y.o. female who is being seen today for the evaluation of heart failure and bradycardia at the request of Dr.Singh, P  She presented to the ER on 4/10 with the complaint of shortness of breath after abnormal EKGl at PCP office.  ER EKG showed sinus bradycardia, HR 56, left axis deviation and LBBB. Trop poc 0.76. BUN 35, Cr 1.73. CXR showed cardiomegaly with vascular and interstitial prominence suggesting mild volume overload. Troponin I is 5.52 (4/10) no other values.  She has been receiving continue IV Lasix along with Imdur. Beta blocker discontinued todaydue to episodes of bradycardia into low 40s multiple times. Medicine would like advice on whether it is okay to continue beta blocker en lou of bradycardia.   Today she reports feeling significantly better. No CP or SOB. No LE edema     Past Medical History:  Diagnosis Date  . CHF (congestive heart failure) (HCC)   . CKD (chronic kidney disease)   . COPD (chronic obstructive pulmonary disease) (HCC)   . Hyperlipidemia   . Hypertension   . LBBB (left  bundle branch block)   . OSA (obstructive sleep apnea)      Past Surgical History:  Procedure Laterality Date  . ABDOMINAL HYSTERECTOMY  1969  . APPENDECTOMY  1940  . CARDIAC CATHETERIZATION N/A 01/06/2017   Procedure: Right/Left Heart Cath and Coronary Angiography;  Surgeon: Marykay Lex, MD;  Location: [facility] INVASIVE CV LAB;  Service: Cardiovascular;  Laterality: N/A;  . carpal tunnel Right   . COLONOSCOPY  2008   Negative  . lower back Right    bone spur    No Known Allergies  I have reviewed the patient's current medications . aspirin  325 mg Oral Daily  . cholecalciferol  1,000 Units Oral Daily  . enoxaparin (LOVENOX) injection  40 mg Subcutaneous Q24H  . ferrous sulfate  325 mg Oral Q breakfast  . furosemide  40 mg Intravenous Q12H  . hydrALAZINE  50 mg Oral Q8H  . isosorbide mononitrate  60 mg Oral Daily  . mometasone-formoterol  2 puff Inhalation BID  . [START ON 03/24/2017] potassium chloride SA  40 mEq Oral Daily  . potassium chloride  40 mEq Oral Q6H  . pravastatin  40 mg Oral Daily  . sertraline  25 mg Oral Daily  . sodium chloride flush  3 mL Intravenous Q12H  . umeclidinium bromide  1 puff Inhalation Daily  . vitamin B-12  1,000 mcg Oral Daily    Prior to Admission medications   Medication Sig Start Date End Date Taking? Authorizing Provider  acetaminophen (TYLENOL) 500 MG tablet Take 500 mg by mouth every  6 (six) hours as needed.   Yes Historical Provider, MD  ADVAIR DISKUS 500-50 MCG/DOSE AEPB INHALE 1 PUFF INTO THE LUNGS 2 (TWO) TIMES DAILY. 02/28/17  Yes Sharon Seller, NP  aspirin 325 MG tablet Take 325 mg by mouth daily.   Yes Historical Provider, MD  bisoprolol (ZEBETA) 5 MG tablet TAKE 1 TABLET (5 MG TOTAL) BY MOUTH DAILY. 03/07/17  Yes Sharon Seller, NP  Cholecalciferol (VITAMIN D3) 1000 units CAPS Take 1,000 Units by mouth daily.   Yes Historical Provider, MD  ferrous sulfate 325 (65 FE) MG EC tablet Take 325 mg by mouth daily with breakfast.     Yes Historical Provider, MD  INCRUSE ELLIPTA 62.5 MCG/INH AEPB INHALE 1 PUFF INTO THE LUNGS DAILY. 02/28/17  Yes Sharon Seller, NP  isosorbide mononitrate (IMDUR) 60 MG 24 hr tablet Take 1 tablet (60 mg total) by mouth daily. 03/22/17  Yes Sharon Seller, NP  potassium chloride SA (KLOR-CON M20) 20 MEQ tablet Take 1 tablet (20 mEq total) by mouth daily. 03/01/17  Yes Sharon Seller, NP  pravastatin (PRAVACHOL) 40 MG tablet Take 1 tablet (40 mg total) by mouth daily. 01/27/17  Yes Sharon Seller, NP  sertraline (ZOLOFT) 25 MG tablet Take 1 tablet (25 mg total) by mouth daily. 02/07/17  Yes Sharon Seller, NP  torsemide (DEMADEX) 20 MG tablet Take 1 tablet (20 mg total) by mouth daily. 03/22/17  Yes Sharon Seller, NP  vitamin B-12 (CYANOCOBALAMIN) 1000 MCG tablet Take 1,000 mcg by mouth daily.   Yes Historical Provider, MD     Social History   Social History  . Marital status: Widowed    Spouse name: N/A  . Number of children: N/A  . Years of education: N/A   Occupational History  . Not on file.   Social History Main Topics  . Smoking status: Former Games developer  . Smokeless tobacco: Never Used  . Alcohol use No  . Drug use: No  . Sexual activity: No   Other Topics Concern  . Not on file   Social History Narrative   Social History      Diet? never      Do you drink/eat things with caffeine? coffee      Marital status?        widow                            What year were you married? 1955      Do you live in a house, apartment, assisted living, condo, trailer, etc.? house      Is it one or more stories? 2      How many persons live in your home? 3      Do you have any pets in your home? (please list) no      Highest level of education completed? 12 grade      Current or past profession: no      Advanced Directives      Do you exercise?      no                                Type & how often?      Do you have a living will? no       Do you have a DNR  form?    no  If not, do you want to discuss one?      Do you have signed POA/HPOA for forms?  no      Functional Status      Do you have difficulty bathing or dressing yourself? no      Do you have difficulty preparing food or eating? no      Do you have difficulty managing your medications? no      Do you have difficulty managing your finances? no      Do you have difficulty affording your medications?             Family Status  Relation Status  . Father Deceased  . Mother Deceased  . Brother Alive  . Brother Alive  . Brother Alive  . Son Alive  . Son Alive  . Son Alive  . Daughter Alive  . Neg Hx    Family History  Problem Relation Age of Onset  . Hypertension Father   . Stroke Father   . Kidney failure Mother   . Dementia Brother   . COPD Neg Hx   . Heart disease Neg Hx   . Diabetes Neg Hx        ROS:  Full 14 point review of systems complete and found to be negative unless listed above.  Physical Exam: Blood pressure (!) 127/53, pulse (!) 45, temperature 98.4 F (36.9 C), temperature source Oral, resp. rate 18, height 5\' 7"  (1.702 m), weight 175 lb 3.2 oz (79.5 kg), SpO2 98 %.  General: Well developed, well nourished, female in no acute distress Head: Normocephalic and atraumatic, Lungs: normal effort and rate of breathing. Heart:: bradycardia, normal rhythm / pt using restroom during interview unable to assess JVD Neck: No carotid bruits. No lymphadenopathy. Abdomen: Bowel sounds present, abdomen soft and non-tender Msk:  Spontaneously moves all 4 extremities Extremities: No clubbing or cyanosis. No le edema  Neuro: Alert and oriented X 3. No focal deficits noted. Psych:  Good affect, responds appropriately Skin: No rashes or lesions noted.     Labs:  Lab Results  Component Value Date   WBC 8.2 03/23/2017   HGB 10.3 (L) 03/23/2017   HCT 32.5 (L) 03/23/2017   MCV 93.7 03/23/2017   PLT 155 03/23/2017    Recent  Labs Lab 03/23/17 0305  NA 144  K 3.0*  CL 104  CO2 25  BUN 31*  CREATININE 1.36*  CALCIUM 9.2  GLUCOSE 94   Magnesium  Date Value Ref Range Status  01/02/2017 2.1 1.7 - 2.4 mg/dL Final    Recent Labs  16/10/96 1115  TROPONINI 5.52*    Recent Labs  03/22/17 1526  TROPIPOC 0.76*   No results found for: PROBNP Lab Results  Component Value Date   CHOL 195 02/07/2017   HDL 66 02/07/2017   LDLCALC 106 (H) 02/07/2017   TRIG 113 02/07/2017    Echo   Right/Left Heart Cath and Coronary Angiography 01/06/2017   LV end diastolic pressure is moderately elevated consistent with moderately elevated mean pulmonary pressures.  Hemodynamic findings consistent with mild secondary pulmonary hypertension.  Angiographically normal coronary arteries with a right dominant system. Somewhat tortuous RCA and circumflex/ramus systems   Nonischemic Cardiomyopathy with moderate to severely reduced cardiac output/index. Persistently elevated filling pressures indicating continued volume overload  Plan:  Return to nursing unit for ongoing care and TR band removal. The brachial sheath was already removed in the Cath Lab  Per discussion with Dr. [place], we will  start her on oral Lasix 40 mg daily starting today.  Transthoracic Echocardiography 01/03/2017  Study Conclusions  - Left ventricle: The cavity size was normal. There was severe   concentric hypertrophy. Systolic function was moderately reduced.   The estimated ejection fraction was in the range of 35% to 40%.   Wall motion was normal; there were no regional wall motion   abnormalities. Features are consistent with a pseudonormal left   ventricular filling pattern, with concomitant abnormal relaxation   and increased filling pressure (grade 2 diastolic dysfunction).   Doppler parameters are consistent with elevated ventricular   end-diastolic filling pressure. - Aortic root: The aortic root was normal in size. - Mitral  valve: There was mild regurgitation. - Left atrium: The atrium was moderately dilated. - Right ventricle: Systolic function was normal. - Right atrium: The atrium was moderately dilated. - Tricuspid valve: There was moderate regurgitation. - Pulmonic valve: There was mild regurgitation. - Pulmonary arteries: PA peak pressure: 62 mm Hg (S). - Inferior vena cava: The vessel was dilated. The respirophasic   diameter changes were blunted (< 50%), consistent with elevated   central venous pressure. - Pericardium, extracardiac: There was no pericardial effusion.  Impressions:  - Severe concentric left ventricular hypertrophy with mildly   decreased LVEF and diffuse hypokinesis.   A cardiac MRI is recommended to evaluate for possible   infiltrative cardiomyopathy.  ECG:     HR 56, sinus bradycardia with premature atrial complexes, left axis deviation, LBBB. - personally reviewed.   Radiology:  Dg Chest 2 View Result Date: 03/22/2017 Cardiomegaly with vascular and interstitial prominence suggesting mild volume overload versus early developing edema. Mild streaky bibasilar bronchovascular opacities, worse in the left lower lobe may represent atelectasis versus basilar bronchopneumonia. No significant effusion or pneumothorax Thoracic aortic atherosclerosis      ASSESSMENT AND PLAN:      Active Problems:   Hypertensive heart disease   Acute renal failure superimposed on stage 3 chronic kidney disease (HCC)   Acute on chronic combined systolic and diastolic CHF (congestive heart failure) (HCC)   Essential hypertension   COPD without exacerbation (HCC)  1.Acute on chronic combined systolic and diastolic heart failure EF around 40% -- strict I/O's;       - 580 mL -- daily weights;   down 5 lbs -- Continue Lasix 40 mg IV BID, SCr 1.76 -->1.73 --> 1.36 -- Imdur 60 mg PO daily -- Zebeta 5 mg PO daily currently being withheld due to bradycardia into 40's. Per chart review HR is usually  in 80's. LBBB is not new from previous EKG on 01/01/2017 but question if she is having LBBB induced bradycardia.  --  temp pacemaker or cardiac resynchronization therapy ???   2. ARF and CK D3. Improving with diuresis due to CHF decompensation.  3. COPD. At baseline now wheezing, continue supportive care. -- IM to manage  4. History of left bundle branch block. Stable.  5. Hypertension. Currently on Imdur and diuretic and 50 mg PO Hydralazine.  6.Hypokalemia- 3.5 >>>3.0, replete potassium  Plan: Will discuss with MD, patient is diuresing well on 40 mg BID lasix continue this. Needs potassium repletion. Unfortunately due to bradycardia 45-50 HR, she cannot tolerate beta blocker at this time but consider reinitiating at a later time if heart rate can tolerate.   SignedHerma Ard 03/23/2017 12:50 PM  Attending Note:   The patient was seen and examined.  Agree with assessment and plan as noted above.  Changes made to the above note as needed.  Patient seen and independently examined with Marlon Pel, PA .   We discussed all aspects of the encounter. I agree with the assessment and plan as stated above.  1. Acute on chronic CHF:   With recent exacerbation .    Has responded well to diuretics. Agree with holding bisoprolol given her bradycardia.   2. Elevated troponin:   Curious pattern but almost identical to her presentation in Jan when her troponin was 4.9 and she had normal cors.    She is responding to lasix.   Continue to diurese.     I have spent a total of 40 minutes with patient reviewing hospital  notes , telemetry, EKGs, labs and examining patient as well as establishing an assessment and plan that was discussed with the patient. > 50% of time was spent in direct patient care.    Vesta Mixer, Montez Hageman., MD, [facility] 03/23/2017, 2:16 PM [address]  [address] Office [phone] Pager [phone]

## 2017-03-24 DIAGNOSIS — E875 Hyperkalemia: Secondary | ICD-10-CM

## 2017-03-24 DIAGNOSIS — I10 Essential (primary) hypertension: Secondary | ICD-10-CM

## 2017-03-24 DIAGNOSIS — I5043 Acute on chronic combined systolic (congestive) and diastolic (congestive) heart failure: Secondary | ICD-10-CM

## 2017-03-24 DIAGNOSIS — N179 Acute kidney failure, unspecified: Secondary | ICD-10-CM

## 2017-03-24 DIAGNOSIS — N183 Chronic kidney disease, stage 3 (moderate): Secondary | ICD-10-CM

## 2017-03-24 LAB — BASIC METABOLIC PANEL
Anion gap: 9 (ref 5–15)
BUN: 41 mg/dL — AB (ref 6–20)
CALCIUM: 9 mg/dL (ref 8.9–10.3)
CHLORIDE: 105 mmol/L (ref 101–111)
CO2: 26 mmol/L (ref 22–32)
CREATININE: 1.56 mg/dL — AB (ref 0.44–1.00)
GFR calc non Af Amer: 30 mL/min — ABNORMAL LOW (ref >60)
GFR, EST AFRICAN AMERICAN: 34 mL/min — AB (ref >60)
Glucose, Bld: 119 mg/dL — ABNORMAL HIGH (ref 65–99)
Potassium: 4 mmol/L (ref 3.5–5.1)
SODIUM: 140 mmol/L (ref 135–145)

## 2017-03-24 LAB — MAGNESIUM: Magnesium: 1.3 mg/dL — ABNORMAL LOW (ref 1.7–2.4)

## 2017-03-24 MED ORDER — TORSEMIDE 20 MG PO TABS
20.0000 mg | ORAL_TABLET | Freq: Every day | ORAL | Status: DC
  Administered 2017-03-24 – 2017-03-25 (×2): 20 mg via ORAL
  Filled 2017-03-24 (×2): qty 1

## 2017-03-24 MED ORDER — HYDRALAZINE HCL 50 MG PO TABS
75.0000 mg | ORAL_TABLET | Freq: Three times a day (TID) | ORAL | Status: DC
  Administered 2017-03-24 – 2017-03-25 (×3): 75 mg via ORAL
  Filled 2017-03-24 (×3): qty 1

## 2017-03-24 MED ORDER — ISOSORBIDE MONONITRATE ER 30 MG PO TB24
30.0000 mg | ORAL_TABLET | Freq: Every day | ORAL | Status: DC
  Administered 2017-03-24 – 2017-03-25 (×2): 30 mg via ORAL
  Filled 2017-03-24 (×2): qty 1

## 2017-03-24 NOTE — Progress Notes (Signed)
Progress Note  Patient Name: Kelly [name] Date of Encounter: 03/24/2017  Primary Cardiologist: Dr [place]  Patient Profile     83 y.o. female w/ hx S-D-CHF, COPD, LBBB, admit for resp failure 12/2016 w/ nl cors at cath, EF 35-40% w/ severe LVH. Admitted 04/10 for CP/SOB, cards saw 04/11 for CHF & bradycardia  Subjective   Breathing fine today, but has not been out of bed much. No chest pain or palpitations. Not light-headed or dizzy. Had severe HA last pm, still has but better now.   Inpatient Medications    Scheduled Meds: . aspirin  325 mg Oral Daily  . cholecalciferol  1,000 Units Oral Daily  . enoxaparin (LOVENOX) injection  40 mg Subcutaneous Q24H  . ferrous sulfate  325 mg Oral Q breakfast  . furosemide  40 mg Intravenous Q12H  . hydrALAZINE  50 mg Oral Q8H  . isosorbide mononitrate  60 mg Oral Daily  . mometasone-formoterol  2 puff Inhalation BID  . potassium chloride SA  40 mEq Oral Daily  . pravastatin  40 mg Oral Daily  . sertraline  25 mg Oral Daily  . sodium chloride flush  3 mL Intravenous Q12H  . umeclidinium bromide  1 puff Inhalation Daily  . vitamin B-12  1,000 mcg Oral Daily   Continuous Infusions:  PRN Meds:    Vital Signs    Vitals:   03/23/17 2025 03/23/17 2129 03/24/17 0540 03/24/17 0612  BP:  (!) 138/55 (!) 136/49   Pulse:  (!) 46 (!) 49   Resp:  17 16   Temp:  97.9 F (36.6 C) 98.8 F (37.1 C)   TempSrc:  Oral Oral   SpO2: 99% 97% 97%   Weight:    173 lb 3.2 oz (78.6 kg)  Height:        Intake/Output Summary (Last 24 hours) at 03/24/17 0850 Last data filed at 03/24/17 1898  Gross per 24 hour  Intake              940 ml  Output              950 ml  Net              -10 ml   Filed Weights   03/22/17 1452 03/23/17 0137 03/24/17 0612  Weight: 180 lb (81.6 kg) 175 lb 3.2 oz (79.5 kg) 173 lb 3.2 oz (78.6 kg)    Telemetry    Sinus brady, rare PVCs - Personally Reviewed  ECG    n/a - Personally Reviewed  Physical Exam    General: Well developed, well nourished, female appearing in no acute distress. Head: Normocephalic, atraumatic.  Neck: Supple without bruits, JVD not elevated. Lungs:  Resp regular and unlabored, rales bases Heart: RRR , S1, S2, no S3, S4, soft murmur; no rub. Abdomen: Soft, non-tender, non-distended with normoactive bowel sounds. No hepatomegaly. No rebound/guarding. No obvious abdominal masses. Extremities: No clubbing, cyanosis, no edema. Distal pedal pulses are 2+ bilaterally. Neuro: Alert and oriented X 3. Moves all extremities spontaneously. Psych: Normal affect.  Labs    Hematology Recent Labs Lab 03/22/17 1453 03/23/17 0305  WBC 8.6 8.2  RBC 3.58* 3.47*  HGB 10.5* 10.3*  HCT 33.4* 32.5*  MCV 93.3 93.7  MCH 29.3 29.7  MCHC 31.4 31.7  RDW 16.2* 16.4*  PLT 192 155    Chemistry Recent Labs Lab 03/22/17 1453 03/23/17 0305 03/24/17 0305  NA 143 144 140  K 3.5 3.0* 4.0  CL 103 104 105  CO2 24 25 26   GLUCOSE 99 94 119*  BUN 35* 31* 41*  CREATININE 1.73* 1.36* 1.56*  CALCIUM 9.3 9.2 9.0  GFRNONAA 26* 35* 30*  GFRAA 30* 40* 34*  ANIONGAP 16* 15 9     Cardiac Enzymes Recent Labs Lab 03/22/17 1115  TROPONINI 5.52*    Recent Labs Lab 03/22/17 1526  TROPIPOC 0.76*     Radiology    Dg Chest 2 View  Result Date: 03/22/2017 CLINICAL DATA:  Chest pain, chronic kidney disease, history of CHF, hypertension, hyperlipidemia and COPD EXAM: CHEST  2 VIEW COMPARISON:  None available FINDINGS: Marked cardiomegaly with central vascular congestion and diffuse interstitial prominence suggesting volume overload versus developing early edema. Streaky bibasilar bronchovascular opacities, suspect atelectasis but difficult to exclude mild basilar bronchopneumonia. These changes are worse in the left lower lobe. No large effusion or pneumothorax. Atherosclerosis noted of the aorta. Trachea is midline. Diffuse thoracic spondylosis. No acute compression fracture. IMPRESSION:  Cardiomegaly with vascular and interstitial prominence suggesting mild volume overload versus early developing edema. Mild streaky bibasilar bronchovascular opacities, worse in the left lower lobe may represent atelectasis versus basilar bronchopneumonia. No significant effusion or pneumothorax Thoracic aortic atherosclerosis Electronically Signed   By: Judie Petit.  Shick M.D.   On: 03/22/2017 15:51     Cardiac Studies   None this admit  Patient Profile     83 y.o. female w/ hx S-D-CHF, COPD, LBBB, admit for resp failure 12/2016 w/ nl cors at cath, EF 35-40% w/ severe LVH. Admitted 04/10 for CP/SOB, elevated troponin, cards saw 04/11 for CHF & bradycardia  Assessment & Plan    1.Acute on chronic combined systolic and diastolic heart failure EF around 40% -- strict I/O's;  she is - 810 mL -- daily weights;   down 8 lbs since admit -- Continue Lasix 40 mg IV BID, SCr 1.76 -->1.73 --> 1.36-->1.56 -- Imdur 60 mg PO daily>>decrease to 30 mg qd to help HA -- Zebeta 5 mg PO daily currently being held due to bradycardia into 40's. Per chart review HR is usually in 80's. LBBB is not new from previous EKG  - believe her volume is at baseline, will change Lasix to po Demadex 20 mg qd (home dose)  2.ARF and CK D3. Improving with diuresis but starting to trend up again, change Lasix to po Demadex  3.COPD. At baseline now minimal wheezing, continue supportive care.  -- IM to manage  4.History of left bundle branch block. Stable.  5.Hypertension. Currently on Imdur and diuretic and 50 mg PO Hydralazine. - decreasing Imdur, changing Lasix>>Demadex, so will increase hydralazine to 75 mg qd.  6.Hypokalemia- 3.5 >>>3.0>>4.0, repleted.  - Not on K+ at home  Active Problems:   Hypertensive heart disease   Acute renal failure superimposed on stage 3 chronic kidney disease (HCC)   Acute on chronic combined systolic and diastolic CHF (congestive heart failure) (HCC)   Essential hypertension   COPD  without exacerbation (HCC)    Signed, Leanna Battles 8:50 AM 03/24/2017 Pager: [phone]  Attending Note:   The patient was seen and examined.  Agree with assessment and plan as noted above.  Changes made to the above note as needed.  Patient seen and independently examined with Theodore Demark, PA .   We discussed all aspects of the encounter. I agree with the assessment and plan as stated above.  1.  Acute on chronic combined CHF Doing well. Feels back to baseline.  Has diuresed some and has improved.   3. Sinus bradycardia:   Bisoprolol has been held.   I would continue to hold this for now.  She is close to her baseline .  Will sign off Call for questions    I have spent a total of 20 minutes with patient reviewing hospital  notes , telemetry, EKGs, labs and examining patient as well as establishing an assessment and plan that was discussed with the patient. > 50% of time was spent in direct patient care.    Vesta Mixer, Montez Hageman., MD, [facility] 03/24/2017, 11:45 AM [address],  [address] Office [phone] Pager [phone]

## 2017-03-24 NOTE — Progress Notes (Signed)
PROGRESS NOTE    Kelly [name]  IDH:[id number] DOB: November 23, 1931 DOA: 03/22/2017 PCP: Sharon Seller, NP     Brief Narrative:  83 yo female with systolic heart failure, presents with dyspnea and chest pain. On the initial evaluation found volume overload, started on diuresis.  b blocker discontinued due to bradycardia.    Assessment & Plan:   Active Problems:   Hypertensive heart disease   Acute renal failure superimposed on stage 3 chronic kidney disease (HCC)   Acute on chronic combined systolic and diastolic CHF (congestive heart failure) (HCC)   Essential hypertension   COPD without exacerbation (HCC)   1. Acute on chronic systolic heart failure decompensation, EF 35 to 40%. Volume status improving, will continue diuresis with torsemide, urine output over last 24 hours at 950cc, continue torsemide for diuresis. Blood pressure control with afterload reducing agents hydralazine and isosorbide. Out of bed as tolerated, ambulation, physical therapy ealuation. Suspected non ischemic cardiomyopathy.   2. AKI on CKD stage 3. Patient tolerating well aggressive diuresis with torsemide, continue to target negative fluid balance, renal function with cr at 1,56 from 1,36. K at 4 and serum bicarbonate at 26.   3. COPD. Will continue oxymetry monitoring and supplemental 02 per [place] as needed. Bronchodilator therapy. Continue dulera.   4. HTN. Blood pressure control with hydralazine and isosorbide.   5. Hypokalemia. K corrected to 4. Will follow on renal panel in am, hold k supplements for now.     6. Dyslipidemia. Continue pravastatin.   DVT prophylaxis: enoxaparin  Code Status: full  Family Communication: No family at the bedside  Disposition Plan: home    Consultants:   Cardiology   Procedures:   Antimicrobials:   Subjective: Dyspnea has been improving, no chest pain, no nausea or vomiting. Tolerating po well.   Objective: Vitals:   03/23/17 2025 03/23/17 2129  03/24/17 0540 03/24/17 0612  BP:  (!) 138/55 (!) 136/49   Pulse:  (!) 46 (!) 49   Resp:  17 16   Temp:  97.9 F (36.6 C) 98.8 F (37.1 C)   TempSrc:  Oral Oral   SpO2: 99% 97% 97%   Weight:    78.6 kg (173 lb 3.2 oz)  Height:        Intake/Output Summary (Last 24 hours) at 03/24/17 0928 Last data filed at 03/24/17 0612  Gross per 24 hour  Intake              720 ml  Output              950 ml  Net             -230 ml   Filed Weights   03/22/17 1452 03/23/17 0137 03/24/17 0612  Weight: 81.6 kg (180 lb) 79.5 kg (175 lb 3.2 oz) 78.6 kg (173 lb 3.2 oz)    Examination:  General exam: deconditioned E ENT: mild pallor, oral mucosa moist. Respiratory system: Clear to auscultation. Respiratory effort normal. No wheezing, rales or rhonchi.  Cardiovascular system: S1 & S2 heard, RRR. No JVD, murmurs, rubs, gallops or clicks. Trace pedal edema. Gastrointestinal system: Abdomen is nondistended, soft and nontender. No organomegaly or masses felt. Normal bowel sounds heard. Central nervous system: Alert and oriented. No focal neurological deficits. Extremities: Symmetric 5 x 5 power. Skin: No rashes, lesions or ulcers     Data Reviewed: I have personally reviewed following labs and imaging studies  CBC:  Recent Labs Lab 03/22/17 1453 03/23/17 0305  WBC 8.6 8.2  HGB 10.5* 10.3*  HCT 33.4* 32.5*  MCV 93.3 93.7  PLT 192 155   Basic Metabolic Panel:  Recent Labs Lab 03/22/17 1123 03/22/17 1453 03/23/17 0305 03/24/17 0305  NA 145 143 144 140  K 3.6 3.5 3.0* 4.0  CL 107 103 104 105  CO2 22 24 25 26   GLUCOSE 108* 99 94 119*  BUN 36* 35* 31* 41*  CREATININE 1.76* 1.73* 1.36* 1.56*  CALCIUM 9.0 9.3 9.2 9.0  MG  --   --   --  1.3*   GFR: Estimated Creatinine Clearance: 29.5 mL/min (A) (by C-G formula based on SCr of 1.56 mg/dL (H)). Liver Function Tests: No results for input(s): AST, ALT, ALKPHOS, BILITOT, PROT, ALBUMIN in the last 168 hours. No results for input(s):  LIPASE, AMYLASE in the last 168 hours. No results for input(s): AMMONIA in the last 168 hours. Coagulation Profile: No results for input(s): INR, PROTIME in the last 168 hours. Cardiac Enzymes:  Recent Labs Lab 03/22/17 1115  TROPONINI 5.52*   BNP (last 3 results) No results for input(s): PROBNP in the last 8760 hours. HbA1C: No results for input(s): HGBA1C in the last 72 hours. CBG: No results for input(s): GLUCAP in the last 168 hours. Lipid Profile: No results for input(s): CHOL, HDL, LDLCALC, TRIG, CHOLHDL, LDLDIRECT in the last 72 hours. Thyroid Function Tests:  Recent Labs  03/23/17 1347  TSH 0.669   Anemia Panel: No results for input(s): VITAMINB12, FOLATE, FERRITIN, TIBC, IRON, RETICCTPCT in the last 72 hours. Sepsis Labs: No results for input(s): PROCALCITON, LATICACIDVEN in the last 168 hours.  No results found for this or any previous visit (from the past 240 hour(s)).       Radiology Studies: Dg Chest 2 View  Result Date: 03/22/2017 CLINICAL DATA:  Chest pain, chronic kidney disease, history of CHF, hypertension, hyperlipidemia and COPD EXAM: CHEST  2 VIEW COMPARISON:  None available FINDINGS: Marked cardiomegaly with central vascular congestion and diffuse interstitial prominence suggesting volume overload versus developing early edema. Streaky bibasilar bronchovascular opacities, suspect atelectasis but difficult to exclude mild basilar bronchopneumonia. These changes are worse in the left lower lobe. No large effusion or pneumothorax. Atherosclerosis noted of the aorta. Trachea is midline. Diffuse thoracic spondylosis. No acute compression fracture. IMPRESSION: Cardiomegaly with vascular and interstitial prominence suggesting mild volume overload versus early developing edema. Mild streaky bibasilar bronchovascular opacities, worse in the left lower lobe may represent atelectasis versus basilar bronchopneumonia. No significant effusion or pneumothorax Thoracic  aortic atherosclerosis Electronically Signed   By: Judie Petit.  Shick M.D.   On: 03/22/2017 15:51        Scheduled Meds: . aspirin  325 mg Oral Daily  . cholecalciferol  1,000 Units Oral Daily  . enoxaparin (LOVENOX) injection  40 mg Subcutaneous Q24H  . ferrous sulfate  325 mg Oral Q breakfast  . hydrALAZINE  75 mg Oral Q8H  . isosorbide mononitrate  30 mg Oral Daily  . mometasone-formoterol  2 puff Inhalation BID  . potassium chloride SA  40 mEq Oral Daily  . pravastatin  40 mg Oral Daily  . sertraline  25 mg Oral Daily  . sodium chloride flush  3 mL Intravenous Q12H  . torsemide  20 mg Oral Daily  . umeclidinium bromide  1 puff Inhalation Daily  . vitamin B-12  1,000 mcg Oral Daily   Continuous Infusions:   LOS: 2 days     Loyalty Brashier Annett Gula, MD [facility] Pager [phone]  If 7PM-7AM, please contact night-coverage [email or web address] Password [facility] 03/24/2017, 9:28 AM

## 2017-03-24 NOTE — Progress Notes (Signed)
Tech offered Pt a bath. PT stated she has already did bath.

## 2017-03-24 NOTE — Care Management Note (Addendum)
Case Management Note  Patient Details  Name: Kelly [name] MRN: [id number] Date of Birth: March 11, 1931  Subjective/Objective:    Admitted with CHF               Action/Plan: Patient lives with her son; she is from [place] but plans to stay with her son for now; PCP: Sharon Seller, NP; has private insurance with Medicare ; she use a cane at home but needs a rolling walker at discharge; patient could benefit from [facility] services but patient is refusing at this time; CM will continue to follow for DCP.  03/25/2017 11;50 am- patient has changed her mind and now want HHC services provided by [facility] at home; Mary with [facility] called for arrangements. Abelino Derrick RN  Expected Discharge Date:    possibly 03/25/2017              Expected Discharge Plan:  Home w Home Health Services  Discharge planning Services  CM Consult   Choice offered to:  Patient  HH Arranged:  Patient Refused HH:     Status of Service:  In process, will continue to follow  Kelly [name] [phone] 03/24/2017, 10:00 AM

## 2017-03-25 DIAGNOSIS — J449 Chronic obstructive pulmonary disease, unspecified: Secondary | ICD-10-CM

## 2017-03-25 LAB — BASIC METABOLIC PANEL
Anion gap: 11 (ref 5–15)
BUN: 36 mg/dL — ABNORMAL HIGH (ref 6–20)
CALCIUM: 8.9 mg/dL (ref 8.9–10.3)
CHLORIDE: 102 mmol/L (ref 101–111)
CO2: 27 mmol/L (ref 22–32)
CREATININE: 1.46 mg/dL — AB (ref 0.44–1.00)
GFR calc Af Amer: 37 mL/min — ABNORMAL LOW (ref >60)
GFR calc non Af Amer: 32 mL/min — ABNORMAL LOW (ref >60)
GLUCOSE: 111 mg/dL — AB (ref 65–99)
Potassium: 3.1 mmol/L — ABNORMAL LOW (ref 3.5–5.1)
Sodium: 140 mmol/L (ref 135–145)

## 2017-03-25 MED ORDER — ISOSORBIDE MONONITRATE ER 30 MG PO TB24: 30.0000 mg | ORAL_TABLET | Freq: Every day | ORAL | 0 refills | Status: DC

## 2017-03-25 MED ORDER — POTASSIUM CHLORIDE CRYS ER 20 MEQ PO TBCR
40.0000 meq | EXTENDED_RELEASE_TABLET | Freq: Once | ORAL | Status: AC
  Administered 2017-03-25: 40 meq via ORAL
  Filled 2017-03-25: qty 2

## 2017-03-25 MED ORDER — HYDRALAZINE HCL 25 MG PO TABS: 75.0000 mg | ORAL_TABLET | Freq: Three times a day (TID) | ORAL | 0 refills | Status: DC

## 2017-03-25 NOTE — Care Management Important Message (Signed)
Important Message  Patient Details  Name: Kelly [name] MRN: [id number] Date of Birth: 06/20/1931   Medicare Important Message Given:       Dorena Bodo 03/25/2017, 2:07 PM

## 2017-03-25 NOTE — Discharge Summary (Signed)
Physician Discharge Summary  Kelly [name] XLK:[id number] DOB: [birth year] DOA: 03/22/2017  PCP: Sharon Seller, NP  Admit date: 03/22/2017 Discharge date: 03/25/2017  Admitted From: Home  Disposition:  Home   Recommendations for Outpatient Follow-up:  1. Follow up with PCP in 1- week. 2. Patient was placed on hydralazine TID. 3. Bisoprolol was discontinued due to bradycardia. 4. Isosorbide was decreased to 30 mg daily.    Home Health: Yes  Equipment/Devices: walker   Discharge Condition: Stable  CODE STATUS: Full  Diet recommendation: Heart Healthy  Brief/Interim Summary: This is a 83 yo female who presented to the hospital with the chief complain of chest pain and dyspnea. It was seen by her primary care provider the day of admission, she was found to be dyspneic, worsening symptoms on exertion, associated with ankle edema. Question about compliance with a sodium restricted diet. On initial physical examination blood pressure 152/63, heart rate 57, respiratory rate 15, oxygen saturation 99% on supplemental oxygen. Positive bibasilar rales. Heart S1-S2 present and rhythmic. 1+ pitting edema at the lower extremities. Sodium 143, potassium 3.5, chloride 103, bicarbonate 24, glucose 99, BUN 35, creatinine 1.73, calcium 9.3, white count 8.6, hemoglobin 10.5, hematocrit 33.4, platelets 192. Chest x-ray showed cardiomegaly, cephalization of the vasculature, increase interstitial infiltrates bilaterally, fluid in fissure on the right. EKG showed sinus rhythm with a left axis deviation and left bundle branch block, positive PACs.   The patient was admitted to the hospital with working diagnosis of acute on chronic decompensated systolic heart failure complicated by acute kidney injury.  1. Acute on chronic systolic heart failure decompensation, ejection fraction 35-40%. Nonischemic cardiomyopathy. Patient was admitted to the medical floor with a remote telemetry monitor, diuresis with  intravenous furosemide, negative fluid balance was achieved, -2,120 ml since admission. Patient was successfully transitioned to po torsemide 20 mg daily, she was continued on afterload reducing agents with hydralazine and isosorbide, beta blockade was held due to bradycardia. Holding ACE inhibitor due to acute kidney injury, patient may be candidate to this agent once GFR more stable.   2. Acute kidney injury chronic kidney disease stage 3. Patient tolerated well diuresis with IV furosemide, currently changed to by mouth torsemide, his discharge creatinine is 1.46, potassium 3.1, sodium 140, serum bicarbonate 27. Patient will received potassium chloride orally before discharge, 40 mEq. Patient will continue potassium supplements at home, 20 meq daily. Her serum potassium has ranged between 3 and 4. Close follow-up on kidney function and electrolytes, patient is at risk of developing hyperkalemia.   3. COPD. Stable. Patient received bronchodilators and dulera during this hospitalization, she will continue Advair as an outpatient.  4. Hypertension. Continue blood pressure control with hydralazine/isosorbide.  5. Dyslipidemia. Continue atorvastatin  6. Depression. Continue sertraline.     Discharge Diagnoses:  Active Problems:   Hypertensive heart disease   Acute renal failure superimposed on stage 3 chronic kidney disease (HCC)   Acute on chronic combined systolic and diastolic CHF (congestive heart failure) (HCC)   Essential hypertension   COPD without exacerbation (HCC)    Discharge Instructions   Allergies as of 03/25/2017   No Known Allergies     Medication List    STOP taking these medications   bisoprolol 5 MG tablet Commonly known as:  ZEBETA     TAKE these medications   acetaminophen 500 MG tablet Commonly known as:  TYLENOL Take 500 mg by mouth every 6 (six) hours as needed.   ADVAIR DISKUS 500-50 MCG/DOSE Aepb Generic  drug:  Fluticasone-Salmeterol INHALE 1 PUFF  INTO THE LUNGS 2 (TWO) TIMES DAILY.   aspirin 325 MG tablet Take 325 mg by mouth daily.   ferrous sulfate 325 (65 FE) MG EC tablet Take 325 mg by mouth daily with breakfast.   hydrALAZINE 25 MG tablet Commonly known as:  APRESOLINE Take 3 tablets (75 mg total) by mouth every 8 (eight) hours.   INCRUSE ELLIPTA 62.5 MCG/INH Aepb Generic drug:  umeclidinium bromide INHALE 1 PUFF INTO THE LUNGS DAILY.   isosorbide mononitrate 30 MG 24 hr tablet Commonly known as:  IMDUR Take 1 tablet (30 mg total) by mouth daily. Start taking on:  03/26/2017 What changed:  medication strength  how much to take   potassium chloride SA 20 MEQ tablet Commonly known as:  KLOR-CON M20 Take 1 tablet (20 mEq total) by mouth daily.   pravastatin 40 MG tablet Commonly known as:  PRAVACHOL Take 1 tablet (40 mg total) by mouth daily.   sertraline 25 MG tablet Commonly known as:  ZOLOFT Take 1 tablet (25 mg total) by mouth daily.   torsemide 20 MG tablet Commonly known as:  DEMADEX Take 1 tablet (20 mg total) by mouth daily.   vitamin B-12 1000 MCG tablet Commonly known as:  CYANOCOBALAMIN Take 1,000 mcg by mouth daily.   Vitamin D3 1000 units Caps Take 1,000 Units by mouth daily.            Durable Medical Equipment        Start     Ordered   03/24/17 1005  For home use only DME Walker rolling  Once    Question:  Patient needs a walker to treat with the following condition  Answer:  CHF (congestive heart failure) (HCC)   03/24/17 1005     Follow-up Information    Azalee Course, PA Follow up on 04/04/2017.   Specialties:  Cardiology, Radiology Why:  Please arrive at 2:15 pm for a 2:30 pm appt. Contact information: [address] [place] [address] [phone]          No Known Allergies  Consultations:  Cardiology    Procedures/Studies: Dg Chest 2 View  Result Date: 03/22/2017 CLINICAL DATA:  Chest pain, chronic kidney disease, history of CHF,  hypertension, hyperlipidemia and COPD EXAM: CHEST  2 VIEW COMPARISON:  None available FINDINGS: Marked cardiomegaly with central vascular congestion and diffuse interstitial prominence suggesting volume overload versus developing early edema. Streaky bibasilar bronchovascular opacities, suspect atelectasis but difficult to exclude mild basilar bronchopneumonia. These changes are worse in the left lower lobe. No large effusion or pneumothorax. Atherosclerosis noted of the aorta. Trachea is midline. Diffuse thoracic spondylosis. No acute compression fracture. IMPRESSION: Cardiomegaly with vascular and interstitial prominence suggesting mild volume overload versus early developing edema. Mild streaky bibasilar bronchovascular opacities, worse in the left lower lobe may represent atelectasis versus basilar bronchopneumonia. No significant effusion or pneumothorax Thoracic aortic atherosclerosis Electronically Signed   By: Judie Petit.  Shick M.D.   On: 03/22/2017 15:51       Subjective: Patient feeling better, no nausea or vomiting. Dyspnea improved, no edema. Tolerating po well.   Discharge Exam: Vitals:   03/25/17 0619 03/25/17 0832  BP: (!) 126/41 130/62  Pulse: 63 (!) 51  Resp: 18 20  Temp: 98.3 F (36.8 C) 98.6 F (37 C)   Vitals:   03/24/17 1446 03/24/17 2005 03/25/17 0619 03/25/17 0832  BP: (!) 117/46 (!) 132/44 (!) 126/41 130/62  Pulse: (!) 49 Marland Kitchen)  50 63 (!) 51  Resp:  18 18 20   Temp:  98.3 F (36.8 C) 98.3 F (36.8 C) 98.6 F (37 C)  TempSrc:  Oral Oral Oral  SpO2:  100% 100% 100%  Weight:   78.6 kg (173 lb 3.2 oz)   Height:        General: Pt is alert, awake, not in acute distress Cardiovascular: RRR, S1/S2 +, no rubs, no gallops Respiratory: CTA bilaterally, no wheezing, no rhonchi Abdominal: Soft, NT, ND, bowel sounds + Extremities: no edema, no cyanosis    The results of significant diagnostics from this hospitalization (including imaging, microbiology, ancillary and  laboratory) are listed below for reference.     Microbiology: No results found for this or any previous visit (from the past 240 hour(s)).   Labs: BNP (last 3 results)  Recent Labs  01/01/17 1046 01/03/17 0906 02/04/17 1120  BNP 658.7* 458.4* 500.8*   Basic Metabolic Panel:  Recent Labs Lab 03/22/17 1123 03/22/17 1453 03/23/17 0305 03/24/17 0305 03/25/17 0324  NA 145 143 144 140 140  K 3.6 3.5 3.0* 4.0 3.1*  CL 107 103 104 105 102  CO2 22 24 25 26 27   GLUCOSE 108* 99 94 119* 111*  BUN 36* 35* 31* 41* 36*  CREATININE 1.76* 1.73* 1.36* 1.56* 1.46*  CALCIUM 9.0 9.3 9.2 9.0 8.9  MG  --   --   --  1.3*  --    Liver Function Tests: No results for input(s): AST, ALT, ALKPHOS, BILITOT, PROT, ALBUMIN in the last 168 hours. No results for input(s): LIPASE, AMYLASE in the last 168 hours. No results for input(s): AMMONIA in the last 168 hours. CBC:  Recent Labs Lab 03/22/17 1453 03/23/17 0305  WBC 8.6 8.2  HGB 10.5* 10.3*  HCT 33.4* 32.5*  MCV 93.3 93.7  PLT 192 155   Cardiac Enzymes:  Recent Labs Lab 03/22/17 1115  TROPONINI 5.52*   BNP: Invalid input(s): POCBNP CBG: No results for input(s): GLUCAP in the last 168 hours. D-Dimer No results for input(s): DDIMER in the last 72 hours. Hgb A1c No results for input(s): HGBA1C in the last 72 hours. Lipid Profile No results for input(s): CHOL, HDL, LDLCALC, TRIG, CHOLHDL, LDLDIRECT in the last 72 hours. Thyroid function studies  Recent Labs  03/23/17 1347  TSH 0.669   Anemia work up No results for input(s): VITAMINB12, FOLATE, FERRITIN, TIBC, IRON, RETICCTPCT in the last 72 hours. Urinalysis    Component Value Date/Time   COLORURINE YELLOW 01/01/2017 2352   APPEARANCEUR CLEAR 01/01/2017 2352   LABSPEC 1.008 01/01/2017 2352   PHURINE 5.0 01/01/2017 2352   GLUCOSEU NEGATIVE 01/01/2017 2352   HGBUR SMALL (A) 01/01/2017 2352   BILIRUBINUR NEGATIVE 01/01/2017 2352   KETONESUR NEGATIVE 01/01/2017 2352    PROTEINUR NEGATIVE 01/01/2017 2352   NITRITE NEGATIVE 01/01/2017 2352   LEUKOCYTESUR NEGATIVE 01/01/2017 2352   Sepsis Labs Invalid input(s): PROCALCITONIN,  WBC,  LACTICIDVEN Microbiology No results found for this or any previous visit (from the past 240 hour(s)).   Time coordinating discharge: 45 minutes  SIGNED:   Coralie Keens, MD  [facility] 03/25/2017, 11:00 AM Pager   If 7PM-7AM, please contact night-coverage [email or web address] Password [email or web address]

## 2017-03-25 NOTE — Progress Notes (Signed)
Reviewed all discharge instructions with patient, including f/u appointments, medications, prescription called into [facility] on [address], diet, activity, and heart failure special instructions.  Patient stated understanding.  Patient dressed and called her son for transportation home.  Patient asked to call for me when he arrives.  Patient had no voiced complaints.

## 2017-03-25 NOTE — Progress Notes (Signed)
Pt slept well during the night, Vitals stable, no any sign of SOB and distress noted, no any complain of pain, will continue to monitor the patient. 

## 2017-03-26 DIAGNOSIS — I5043 Acute on chronic combined systolic (congestive) and diastolic (congestive) heart failure: Secondary | ICD-10-CM | POA: Diagnosis not present

## 2017-03-26 DIAGNOSIS — I429 Cardiomyopathy, unspecified: Secondary | ICD-10-CM | POA: Diagnosis not present

## 2017-03-26 DIAGNOSIS — J449 Chronic obstructive pulmonary disease, unspecified: Secondary | ICD-10-CM | POA: Diagnosis not present

## 2017-03-26 DIAGNOSIS — I252 Old myocardial infarction: Secondary | ICD-10-CM | POA: Diagnosis not present

## 2017-03-26 DIAGNOSIS — N183 Chronic kidney disease, stage 3 (moderate): Secondary | ICD-10-CM | POA: Diagnosis not present

## 2017-03-26 DIAGNOSIS — I130 Hypertensive heart and chronic kidney disease with heart failure and stage 1 through stage 4 chronic kidney disease, or unspecified chronic kidney disease: Secondary | ICD-10-CM | POA: Diagnosis not present

## 2017-03-26 DIAGNOSIS — I447 Left bundle-branch block, unspecified: Secondary | ICD-10-CM | POA: Diagnosis not present

## 2017-03-28 DIAGNOSIS — J449 Chronic obstructive pulmonary disease, unspecified: Secondary | ICD-10-CM | POA: Diagnosis not present

## 2017-03-28 DIAGNOSIS — I429 Cardiomyopathy, unspecified: Secondary | ICD-10-CM | POA: Diagnosis not present

## 2017-03-28 DIAGNOSIS — I130 Hypertensive heart and chronic kidney disease with heart failure and stage 1 through stage 4 chronic kidney disease, or unspecified chronic kidney disease: Secondary | ICD-10-CM | POA: Diagnosis not present

## 2017-03-28 DIAGNOSIS — N183 Chronic kidney disease, stage 3 (moderate): Secondary | ICD-10-CM | POA: Diagnosis not present

## 2017-03-28 DIAGNOSIS — I5043 Acute on chronic combined systolic (congestive) and diastolic (congestive) heart failure: Secondary | ICD-10-CM | POA: Diagnosis not present

## 2017-03-28 DIAGNOSIS — I447 Left bundle-branch block, unspecified: Secondary | ICD-10-CM | POA: Diagnosis not present

## 2017-03-29 ENCOUNTER — Encounter: Payer: Self-pay | Admitting: Nurse Practitioner

## 2017-03-29 ENCOUNTER — Telehealth: Payer: Self-pay | Admitting: Nurse Practitioner

## 2017-03-29 ENCOUNTER — Telehealth: Payer: Self-pay

## 2017-03-29 ENCOUNTER — Ambulatory Visit (INDEPENDENT_AMBULATORY_CARE_PROVIDER_SITE_OTHER): Payer: Medicare Other | Admitting: Nurse Practitioner

## 2017-03-29 VITALS — BP 134/78 | HR 78 | Temp 98.5°F | Resp 18 | Ht 66.0 in | Wt 173.6 lb

## 2017-03-29 DIAGNOSIS — I25118 Atherosclerotic heart disease of native coronary artery with other forms of angina pectoris: Secondary | ICD-10-CM | POA: Diagnosis not present

## 2017-03-29 DIAGNOSIS — I110 Hypertensive heart disease with heart failure: Secondary | ICD-10-CM | POA: Diagnosis not present

## 2017-03-29 DIAGNOSIS — M25572 Pain in left ankle and joints of left foot: Secondary | ICD-10-CM | POA: Diagnosis not present

## 2017-03-29 LAB — BASIC METABOLIC PANEL WITH GFR
BUN: 32 mg/dL — ABNORMAL HIGH (ref 7–25)
CHLORIDE: 101 mmol/L (ref 98–110)
CO2: 24 mmol/L (ref 20–31)
CREATININE: 1.43 mg/dL — AB (ref 0.60–0.88)
Calcium: 9.9 mg/dL (ref 8.6–10.4)
GFR, Est African American: 39 mL/min — ABNORMAL LOW (ref >=60)
GFR, Est Non African American: 34 mL/min — ABNORMAL LOW (ref >=60)
GLUCOSE: 87 mg/dL (ref 65–99)
Potassium: 3.9 mmol/L (ref 3.5–5.3)
SODIUM: 140 mmol/L (ref 135–146)

## 2017-03-29 MED ORDER — PREDNISONE 20 MG PO TABS: 20.0000 mg | ORAL_TABLET | Freq: Two times a day (BID) | ORAL | 0 refills | Status: DC

## 2017-03-29 NOTE — Telephone Encounter (Signed)
LM for pt regarding [facility] transportation, [place]

## 2017-03-29 NOTE — Progress Notes (Signed)
Careteam: Patient Care Team: Lauree Chandler, NP as PCP - General (Geriatric Medicine)  Advanced Directive information Does Patient Have a Medical Advance Directive?: No  No Known Allergies  Chief Complaint  Patient presents with  . Transitions Of Care    Pt was hospitalized at [facility] from 03/22/17 until 03/25/17. Pt c/o pain and swelling in left ankle/foot since this am. no known injury     HPI: Patient is a 83 y.o. female seen in the office today to follow up hospitalization. Pt here today with daughter in law.  Pt was sent to the hospitalized after changes in EKG, with chest pains and dyspneic.  The patient was admitted to the hospital with working diagnosis of acute on chronic decompensated systolic heart failure complicated by acute kidney injury.  She is currently doing well at home. No increase shortness of breath.  Pain and swelling to left foot.  Pt reports yesterday morning her ankle started to bother her when she would stand on it. Today is painful and more swollen. Pain of 8/10. Throbbing pain under her foot and ankle. No injury. No hx of gout. No redness or heat.   Daughter in law concerned about diet and her CHF, finding "hidden water" would like RD referral.   Would like handicap sticker   Review of Systems:  Review of Systems  Constitutional: Negative for activity change, appetite change, chills, diaphoresis, fatigue, fever and unexpected weight change.  Respiratory: Negative for cough, chest tightness, shortness of breath and wheezing.   Cardiovascular: Negative for chest pain, palpitations and leg swelling.  Gastrointestinal: Negative for abdominal pain, constipation, diarrhea, nausea and vomiting.  Genitourinary: Negative for difficulty urinating.  Musculoskeletal: Positive for joint swelling (to left ankle).  Skin: Negative for color change, rash and wound.  Neurological: Negative for dizziness and tremors.  Psychiatric/Behavioral: Negative for agitation,  behavioral problems and confusion. The patient is not nervous/anxious.     Past Medical History:  Diagnosis Date  . Anemia   . Anginal pain ([place])   . Arthritis    "legs, hands" (03/23/2017)  . CHF (congestive heart failure) ([place])   . CKD (chronic kidney disease), stage III   . COPD (chronic obstructive pulmonary disease) ([place])   . Depression   . GERD (gastroesophageal reflux disease)   . Heart murmur   . Hyperlipidemia   . Hypertension   . LBBB (left bundle branch block)   . NSTEMI (non-ST elevated myocardial infarction) ([place]) 1980s X 3; 12/2016   "light ones" (03/23/2017); Archie Endo 01/11/2017  . OSA on CPAP    "since 2013"  . Pneumonia 1980s X 1; 12/2016  . Type II diabetes mellitus ([place])    Past Surgical History:  Procedure Laterality Date  . ABDOMINAL HYSTERECTOMY  1969  . APPENDECTOMY  1940  . BACK SURGERY    . CARDIAC CATHETERIZATION N/A 01/06/2017   Procedure: Right/Left Heart Cath and Coronary Angiography;  Surgeon: Leonie Man, MD;  Location: [facility];  Service: Cardiovascular;  Laterality: N/A;  . CARPAL TUNNEL RELEASE Right   . CATARACT EXTRACTION W/ INTRAOCULAR LENS  IMPLANT, BILATERAL Bilateral   . COLONOSCOPY  2008   Negative  . DILATION AND CURETTAGE OF UTERUS  X 3  . LUMBAR LAMINECTOMY Right ?2000 - 2003 X 2   "bone spurs both times"  . TONSILLECTOMY     Social History:   reports that she has never smoked. She has never used smokeless tobacco. She reports that she does  not drink alcohol or use drugs.  Family History  Problem Relation Age of Onset  . Hypertension Father   . Stroke Father   . Kidney failure Mother   . Dementia Brother   . COPD Neg Hx   . Heart disease Neg Hx   . Diabetes Neg Hx     Medications: Patient's Medications  New Prescriptions   No medications on file  Previous Medications   ACETAMINOPHEN (TYLENOL) 500 MG TABLET    Take 500 mg by mouth every 6 (six) hours as needed.   ADVAIR DISKUS 500-50 MCG/DOSE AEPB    INHALE  1 PUFF INTO THE LUNGS 2 (TWO) TIMES DAILY.   ASPIRIN 325 MG TABLET    Take 325 mg by mouth daily.   CHOLECALCIFEROL (VITAMIN D3) 1000 UNITS CAPS    Take 1,000 Units by mouth daily.   FERROUS SULFATE 325 (65 FE) MG EC TABLET    Take 325 mg by mouth daily with breakfast.    HYDRALAZINE (APRESOLINE) 25 MG TABLET    Take 3 tablets (75 mg total) by mouth every 8 (eight) hours.   INCRUSE ELLIPTA 62.5 MCG/INH AEPB    INHALE 1 PUFF INTO THE LUNGS DAILY.   ISOSORBIDE MONONITRATE (IMDUR) 30 MG 24 HR TABLET    Take 1 tablet (30 mg total) by mouth daily.   POTASSIUM CHLORIDE SA (KLOR-CON M20) 20 MEQ TABLET    Take 1 tablet (20 mEq total) by mouth daily.   PRAVASTATIN (PRAVACHOL) 40 MG TABLET    Take 1 tablet (40 mg total) by mouth daily.   SERTRALINE (ZOLOFT) 25 MG TABLET    Take 1 tablet (25 mg total) by mouth daily.   TORSEMIDE (DEMADEX) 20 MG TABLET    Take 1 tablet (20 mg total) by mouth daily.   VITAMIN B-12 (CYANOCOBALAMIN) 1000 MCG TABLET    Take 1,000 mcg by mouth daily.  Modified Medications   No medications on file  Discontinued Medications   No medications on file     Physical Exam:  Vitals:   03/29/17 1441  BP: 134/78  Pulse: 78  Resp: 18  Temp: 98.5 F (36.9 C)  TempSrc: Oral  SpO2: 97%  Weight: 173 lb 9.6 oz (78.7 kg)  Height: 5' 6"  (1.676 m)   Body mass index is 28.02 kg/m.  Physical Exam  Constitutional: She is oriented to person, place, and time. She appears well-developed and well-nourished. No distress.  HENT:  Head: Normocephalic and atraumatic.  Mouth/Throat: Oropharynx is clear and moist.  Eyes: Conjunctivae and EOM are normal. Pupils are equal, round, and reactive to light.  Neck: Neck supple. No JVD present.  Cardiovascular: Normal rate, regular rhythm, normal heart sounds and intact distal pulses.  PMI is not displaced.   No murmur heard. Pulses:      Radial pulses are 2+ on the right side, and 2+ on the left side.       Dorsalis pedis pulses are 1+ on the  right side, and 1+ on the left side.  Pulmonary/Chest: Effort normal and breath sounds normal. No respiratory distress. She has no wheezes. She exhibits no tenderness.  Musculoskeletal: She exhibits no edema.       Left ankle: She exhibits decreased range of motion and swelling. She exhibits no ecchymosis and no deformity. Tenderness. Achilles tendon normal.  Neurological: She is alert and oriented to person, place, and time.  Skin: Skin is warm and dry. She is not diaphoretic. No erythema.  Psychiatric: She  has a normal mood and affect. Her behavior is normal. Judgment and thought content normal.  Flat affect    Labs reviewed: Basic Metabolic Panel:  Recent Labs  01/01/17 1609 01/02/17 0226  03/23/17 0305 03/23/17 1347 03/24/17 0305 03/25/17 0324  NA  --  142  < > 144  --  140 140  K  --  2.9*  < > 3.0*  --  4.0 3.1*  CL  --  104  < > 104  --  105 102  CO2  --  27  < > 25  --  26 27  GLUCOSE  --  152*  < > 94  --  119* 111*  BUN  --  30*  < > 31*  --  41* 36*  CREATININE  --  1.39*  < > 1.36*  --  1.56* 1.46*  CALCIUM  --  8.9  < > 9.2  --  9.0 8.9  MG  --  2.1  --   --   --  1.3*  --   PHOS 4.5  --   --   --   --   --   --   TSH  --   --   --   --  0.669  --   --   < > = values in this interval not displayed. Liver Function Tests:  Recent Labs  01/01/17 1046 02/07/17 1033  AST 44* 19  ALT 57* 19  ALKPHOS 82 73  BILITOT 1.1 0.5  PROT 8.2* 7.0  ALBUMIN 3.9 3.9   No results for input(s): LIPASE, AMYLASE in the last 8760 hours. No results for input(s): AMMONIA in the last 8760 hours. CBC:  Recent Labs  01/01/17 1046  02/04/17 1120 03/22/17 1453 03/23/17 0305  WBC 20.8*  < > 10.7 8.6 8.2  NEUTROABS 9.5*  --  6,955  --   --   HGB 10.4*  < > 11.7 10.5* 10.3*  HCT 33.6*  < > 36.8 33.4* 32.5*  MCV 94.1  < > 93.6 93.3 93.7  PLT 232  < > 267 192 155  < > = values in this interval not displayed. Lipid Panel:  Recent Labs  02/07/17 1033  CHOL 195  HDL 66    LDLCALC 106*  TRIG 113  CHOLHDL 3.0   TSH:  Recent Labs  03/23/17 1347  TSH 0.669   A1C: Lab Results  Component Value Date   HGBA1C 5.5 01/01/2017     Assessment/Plan 1. Hypertensive heart disease with heart failure (HCC) Blood pressure stable at this time. No bradycardia today. ACE on hold due to ARI during hospitalization. Pt diureses during hospitalization, and euvolemic at this time. To cont torsemide 20 mg daily  - Amb ref to Medical Nutrition Therapy-MNT to help with diet.   2. Coronary artery disease of native artery of native heart with stable angina pectoris ([place]) Without chest pains at this time. Will cont current regimen, has follow up with cardiologist scheduled.   3. Acute left ankle pain Possible gout vs OA flare, no injury noted. - BMP with eGFR - Uric Acid - predniSONE (DELTASONE) 20 MG tablet; Take 1 tablet (20 mg total) by mouth 2 (two) times daily with a meal.  Dispense: 6 tablet; Refill: 0  Follow up in weeks with Dr Sharee Holster K. Harle Battiest  [facility] [phone] 8 am - 5 pm) [phone] (after hours)

## 2017-03-29 NOTE — Patient Instructions (Signed)
To use prednisone twice daily for 3 days Elevate ankle, may use ice or heat  Nutritionist consult placed for diet education   Follow up with Dr Renato Gails in 4 weeks for routine follow up

## 2017-03-29 NOTE — Telephone Encounter (Addendum)
I have made the 1st attempt to contact the patient or family member in charge, in order to follow up from recently being discharged from the hospital. I left a message on voicemail but I will make another attempt at a different time.   Transition Care Management Follow-Up Telephone Call   Date discharged and where: [facility] on 03/25/2017  How have you been since you were released from the hospital? Hasn't been feeling too good. Foot has been swollen and will talk to Shanda Bumps about it today.  Any patient concerns? Other than foot she has none.  Items Reviewed:   Meds:  Y  Allergies: Y  Dietary Changes Reviewed: Y  Functional Questionnaire:  Independent-I Dependent-D  ADLs:   Dressing- I    Eating- I   Maintaining continence- I   Transferring- Dependant with assistance of [employer]- Dependant, pt doesn't drive   Meal Prep- Dependant   Managing Meds- I  Confirmed importance and Date/Time of follow-up visits scheduled: Yes, 03/29/2017 at 2:45 pm   Confirmed with patient if condition worsens to call PCP or go to the Emergency Dept. Patient was given office number and encouraged to call back with questions or concerns: Yes

## 2017-03-30 ENCOUNTER — Other Ambulatory Visit: Payer: Self-pay | Admitting: Nurse Practitioner

## 2017-03-30 DIAGNOSIS — M10272 Drug-induced gout, left ankle and foot: Secondary | ICD-10-CM

## 2017-03-30 DIAGNOSIS — F329 Major depressive disorder, single episode, unspecified: Secondary | ICD-10-CM

## 2017-03-30 DIAGNOSIS — F32A Depression, unspecified: Secondary | ICD-10-CM

## 2017-03-30 LAB — URIC ACID: URIC ACID, SERUM: 12 mg/dL — AB (ref 2.5–7.0)

## 2017-03-30 MED ORDER — ALLOPURINOL 100 MG PO TABS: 100.0000 mg | ORAL_TABLET | Freq: Every day | ORAL | 6 refills | Status: DC

## 2017-03-31 DIAGNOSIS — J449 Chronic obstructive pulmonary disease, unspecified: Secondary | ICD-10-CM | POA: Diagnosis not present

## 2017-03-31 DIAGNOSIS — N183 Chronic kidney disease, stage 3 (moderate): Secondary | ICD-10-CM | POA: Diagnosis not present

## 2017-03-31 DIAGNOSIS — I429 Cardiomyopathy, unspecified: Secondary | ICD-10-CM | POA: Diagnosis not present

## 2017-03-31 DIAGNOSIS — I447 Left bundle-branch block, unspecified: Secondary | ICD-10-CM | POA: Diagnosis not present

## 2017-03-31 DIAGNOSIS — I130 Hypertensive heart and chronic kidney disease with heart failure and stage 1 through stage 4 chronic kidney disease, or unspecified chronic kidney disease: Secondary | ICD-10-CM | POA: Diagnosis not present

## 2017-03-31 DIAGNOSIS — I5043 Acute on chronic combined systolic (congestive) and diastolic (congestive) heart failure: Secondary | ICD-10-CM | POA: Diagnosis not present

## 2017-04-01 ENCOUNTER — Telehealth: Payer: Self-pay | Admitting: Cardiology

## 2017-04-01 DIAGNOSIS — I130 Hypertensive heart and chronic kidney disease with heart failure and stage 1 through stage 4 chronic kidney disease, or unspecified chronic kidney disease: Secondary | ICD-10-CM | POA: Diagnosis not present

## 2017-04-01 DIAGNOSIS — N183 Chronic kidney disease, stage 3 (moderate): Secondary | ICD-10-CM | POA: Diagnosis not present

## 2017-04-01 DIAGNOSIS — I429 Cardiomyopathy, unspecified: Secondary | ICD-10-CM | POA: Diagnosis not present

## 2017-04-01 DIAGNOSIS — I5043 Acute on chronic combined systolic (congestive) and diastolic (congestive) heart failure: Secondary | ICD-10-CM | POA: Diagnosis not present

## 2017-04-01 DIAGNOSIS — J449 Chronic obstructive pulmonary disease, unspecified: Secondary | ICD-10-CM | POA: Diagnosis not present

## 2017-04-01 DIAGNOSIS — I447 Left bundle-branch block, unspecified: Secondary | ICD-10-CM | POA: Diagnosis not present

## 2017-04-01 NOTE — Telephone Encounter (Signed)
New message   Per RN pt has a had a 5lb weight gain. Lungs clear, and no signs of shortness of breath. No complaints from patient. Just wanted to make Dr. Swaziland aware.

## 2017-04-01 NOTE — Telephone Encounter (Signed)
Returned the phone call to Marylene Land to inform her of Dr. Elvis Coil instruction to continue to monitor and then she can be assessed on Monday at her appointment. She verbalized her understanding.

## 2017-04-01 NOTE — Telephone Encounter (Signed)
Returned the phone call to Marylene Land from [facility]. Per Marylene Land the patient's weight has been:  4/18: 168.3 4/19: 165.8 4/20: 170.3  Marylene Land stated that the patient is asymptomatic with no edema or shortness of breath. Lung sounds are clear and oxygen is at 98%. The patient has an appointment on Monday with Azalee Course, PA.

## 2017-04-01 NOTE — Telephone Encounter (Signed)
Continue to monitor. Can assess at visit on Monday.  Peter Swaziland MD, [facility]

## 2017-04-04 ENCOUNTER — Ambulatory Visit (INDEPENDENT_AMBULATORY_CARE_PROVIDER_SITE_OTHER): Payer: Medicare Other | Admitting: Physician Assistant

## 2017-04-04 ENCOUNTER — Encounter: Payer: Self-pay | Admitting: Physician Assistant

## 2017-04-04 VITALS — BP 110/60 | HR 90 | Ht 67.0 in | Wt 170.8 lb

## 2017-04-04 DIAGNOSIS — I10 Essential (primary) hypertension: Secondary | ICD-10-CM | POA: Diagnosis not present

## 2017-04-04 DIAGNOSIS — E785 Hyperlipidemia, unspecified: Secondary | ICD-10-CM

## 2017-04-04 DIAGNOSIS — Z9989 Dependence on other enabling machines and devices: Secondary | ICD-10-CM | POA: Diagnosis not present

## 2017-04-04 DIAGNOSIS — G4733 Obstructive sleep apnea (adult) (pediatric): Secondary | ICD-10-CM | POA: Diagnosis not present

## 2017-04-04 DIAGNOSIS — I5022 Chronic systolic (congestive) heart failure: Secondary | ICD-10-CM

## 2017-04-04 DIAGNOSIS — E119 Type 2 diabetes mellitus without complications: Secondary | ICD-10-CM | POA: Diagnosis not present

## 2017-04-04 DIAGNOSIS — I25118 Atherosclerotic heart disease of native coronary artery with other forms of angina pectoris: Secondary | ICD-10-CM

## 2017-04-04 NOTE — Patient Instructions (Addendum)
Heart Failure Prevention Plan: 1. Avoid salt 2. Limit fluid intake to < 2 Liters daily 3. Weigh yourself every morning and keep a weight diary, you can take additional 20mg  Torsemide if weight increase by more than 3 lbs overnight or 5 lbs in a single week   Follow up with Dr. Swaziland as scheduled.

## 2017-04-04 NOTE — Progress Notes (Signed)
Cardiology Office Note    Date:  04/04/2017   ID:  Kelly [name], DOB Feb 10, 1931, MRN [id number]  PCP:  Sharon Seller, NP  Cardiologist:  Dr. Swaziland  Chief Complaint  Patient presents with  . Follow-up    post hosp    History of Present Illness:  Kelly [name] is a 83 y.o. female with PMH of CKD stage III, HTN, HLD, DM II, LBBB, OSA on CPAP and h/o NSTEMI w/ nor cor on cath. She was admitted in January 2018 for acute respiratory failure with hypoxia secondary to acute combined chronic systolic and diastolic heart failure. She had a reported history of "3 heart attacks" in the past. She was followed by a physician in [place] for prior cardiac events. Her cardiac enzyme was fluid in for an STEMI at the time, troponin peaked at 4.91. Echocardiogram showed EF 35-40% with severe LVH and a restrictive parameter. She underwent cardiac catheterization that showed pulmonary hypertension with elevated filling pressure, but normal coronaries. She was diuresed with improvement and started on bisoprolol, this dose was reduced due to bradycardia. ARB was held due to acute renal failure. Her last follow-up in the [facility] office was on 02/04/2017, her weight was up 168 pounds at this time. 3 month follow-up was recommended.  She returned to the ED on 03/22/2016 with complaint of shortness of breath after abnormal EKG at PCPs office. EKG shows sinus bradycardia, heart rate 56, left axis deviation and left bundle branch block. Point-of-care troponin was 0.76. Creatinine 1.73. She was again diagnosed with acute on chronic combined systolic and diastolic heart failure. She was placed on IV Lasix. Her the Zebeta was held due to bradycardia into the 40s.  She has been doing well since hospital hospitalization. She is actually back on the previous dose of torsemide 20 mg. On physical exam, she does not have jugular vein distention, she has no lower extremity edema. Her lung sounds clear. Will continue on  current medication. We did discuss heart failure prevention include avoidance of salt, limit total amount of fluid intake per day to less than 2 L, and also monitor her weight every single morning and to take an additional torsemide if her weight increases by more than 3 pounds overnight or 5 pounds in a single week.   Past Medical History:  Diagnosis Date  . Anemia   . Anginal pain (HCC)   . Arthritis    "legs, hands" (03/23/2017)  . CHF (congestive heart failure) (HCC)   . CKD (chronic kidney disease), stage III   . COPD (chronic obstructive pulmonary disease) (HCC)   . Depression   . GERD (gastroesophageal reflux disease)   . Heart murmur   . Hyperlipidemia   . Hypertension   . LBBB (left bundle branch block)   . NSTEMI (non-ST elevated myocardial infarction) (HCC) 1980s X 3; 12/2016   "light ones" (03/23/2017); [name] 01/11/2017  . OSA on CPAP    "since 2013"  . Pneumonia 1980s X 1; 12/2016  . Type II diabetes mellitus (HCC)     Past Surgical History:  Procedure Laterality Date  . ABDOMINAL HYSTERECTOMY  1969  . APPENDECTOMY  1940  . BACK SURGERY    . CARDIAC CATHETERIZATION N/A 01/06/2017   Procedure: Right/Left Heart Cath and Coronary Angiography;  Surgeon: Marykay Lex, MD;  Location: [facility] INVASIVE CV LAB;  Service: Cardiovascular;  Laterality: N/A;  . CARPAL TUNNEL RELEASE Right   . CATARACT EXTRACTION W/ INTRAOCULAR LENS  IMPLANT, BILATERAL Bilateral   .  COLONOSCOPY  2008   Negative  . DILATION AND CURETTAGE OF UTERUS  X 3  . LUMBAR LAMINECTOMY Right ?2000 - 2003 X 2   "bone spurs both times"  . TONSILLECTOMY      Current Medications: Outpatient Medications Prior to Visit  Medication Sig Dispense Refill  . acetaminophen (TYLENOL) 500 MG tablet Take 500 mg by mouth every 6 (six) hours as needed.    Marland Kitchen ADVAIR DISKUS 500-50 MCG/DOSE AEPB INHALE 1 PUFF INTO THE LUNGS 2 (TWO) TIMES DAILY. 60 each 1  . allopurinol (ZYLOPRIM) 100 MG tablet Take 1 tablet (100 mg total) by  mouth daily. 30 tablet 6  . aspirin 325 MG tablet Take 325 mg by mouth daily.    . Cholecalciferol (VITAMIN D3) 1000 units CAPS Take 1,000 Units by mouth daily.    . ferrous sulfate 325 (65 FE) MG EC tablet Take 325 mg by mouth daily with breakfast.     . hydrALAZINE (APRESOLINE) 25 MG tablet Take 3 tablets (75 mg total) by mouth every 8 (eight) hours. 90 tablet 0  . INCRUSE ELLIPTA 62.5 MCG/INH AEPB INHALE 1 PUFF INTO THE LUNGS DAILY. 30 each 1  . isosorbide mononitrate (IMDUR) 30 MG 24 hr tablet Take 1 tablet (30 mg total) by mouth daily. 30 tablet 0  . potassium chloride SA (KLOR-CON M20) 20 MEQ tablet Take 1 tablet (20 mEq total) by mouth daily. 30 tablet 1  . pravastatin (PRAVACHOL) 40 MG tablet Take 1 tablet (40 mg total) by mouth daily. 30 tablet 0  . sertraline (ZOLOFT) 25 MG tablet TAKE 1 TABLET BY MOUTH EVERY DAY 30 tablet 5  . torsemide (DEMADEX) 20 MG tablet Take 1 tablet (20 mg total) by mouth daily. 90 tablet 1  . vitamin B-12 (CYANOCOBALAMIN) 1000 MCG tablet Take 1,000 mcg by mouth daily.    . predniSONE (DELTASONE) 20 MG tablet Take 1 tablet (20 mg total) by mouth 2 (two) times daily with a meal. 6 tablet 0   No facility-administered medications prior to visit.      Allergies:   Patient has no known allergies.   Social History   Social History  . Marital status: Widowed    Spouse name: N/A  . Number of children: N/A  . Years of education: N/A   Social History Main Topics  . Smoking status: Never Smoker  . Smokeless tobacco: Never Used  . Alcohol use No  . Drug use: No  . Sexual activity: No   Other Topics Concern  . None   Social History Narrative   Social History      Diet? never      Do you drink/eat things with caffeine? coffee      Marital status?        widow                            What year were you married? 1955      Do you live in a house, apartment, assisted living, condo, trailer, etc.? house      Is it one or more stories? 2      How  many persons live in your home? 3      Do you have any pets in your home? (please list) no      Highest level of education completed? 12 grade      Current or past profession: no      Advanced Directives  Do you exercise?      no                                Type & how often?      Do you have a living will? no       Do you have a DNR form?    no                              If not, do you want to discuss one?      Do you have signed POA/HPOA for forms?  no      Functional Status      Do you have difficulty bathing or dressing yourself? no      Do you have difficulty preparing food or eating? no      Do you have difficulty managing your medications? no      Do you have difficulty managing your finances? no      Do you have difficulty affording your medications?              Family History:  The patient's family history includes Dementia in her brother; Hypertension in her father; Kidney failure in her mother; Stroke in her father.   ROS:   Please see the history of present illness.    ROS All other systems reviewed and are negative.   PHYSICAL EXAM:   VS:  BP 110/60   Pulse 90   Ht 5\' 7"  (1.702 m)   Wt 170 lb 12.8 oz (77.5 kg)   SpO2 98%   BMI 26.75 kg/m    GEN: Well nourished, well developed, in no acute distress  HEENT: normal  Neck: no JVD, carotid bruits, or masses Cardiac: RRR; no murmurs, rubs, or gallops,no edema  Respiratory:  clear to auscultation bilaterally, normal work of breathing GI: soft, nontender, nondistended, + BS MS: no deformity or atrophy  Skin: warm and dry, no rash Neuro:  Alert and Oriented x 3, Strength and sensation are intact Psych: euthymic mood, full affect  Wt Readings from Last 3 Encounters:  04/04/17 170 lb 12.8 oz (77.5 kg)  03/29/17 173 lb 9.6 oz (78.7 kg)  03/25/17 173 lb 3.2 oz (78.6 kg)      Studies/Labs Reviewed:   EKG:  EKG is not ordered today.    Recent Labs: 02/04/2017: Brain Natriuretic Peptide  500.8 02/07/2017: ALT 19 03/23/2017: Hemoglobin 10.3; Platelets 155; TSH 0.669 03/24/2017: Magnesium 1.3 03/29/2017: BUN 32; Creat 1.43; Potassium 3.9; Sodium 140   Lipid Panel    Component Value Date/Time   CHOL 195 02/07/2017 1033   TRIG 113 02/07/2017 1033   HDL 66 02/07/2017 1033   CHOLHDL 3.0 02/07/2017 1033   VLDL 23 02/07/2017 1033   LDLCALC 106 (H) 02/07/2017 1033    Additional studies/ records that were reviewed today include:    Echo 01/03/2017 LV EF: 35% -   40%  - Left ventricle: The cavity size was normal. There was severe   concentric hypertrophy. Systolic function was moderately reduced.   The estimated ejection fraction was in the range of 35% to 40%.   Wall motion was normal; there were no regional wall motion   abnormalities. Features are consistent with a pseudonormal left   ventricular filling pattern, with concomitant abnormal relaxation   and increased filling pressure (grade 2 diastolic dysfunction).  Doppler parameters are consistent with elevated ventricular   end-diastolic filling pressure. - Aortic root: The aortic root was normal in size. - Mitral valve: There was mild regurgitation. - Left atrium: The atrium was moderately dilated. - Right ventricle: Systolic function was normal. - Right atrium: The atrium was moderately dilated. - Tricuspid valve: There was moderate regurgitation. - Pulmonic valve: There was mild regurgitation. - Pulmonary arteries: PA peak pressure: 62 mm Hg (S). - Inferior vena cava: The vessel was dilated. The respirophasic   diameter changes were blunted (< 50%), consistent with elevated   central venous pressure. - Pericardium, extracardiac: There was no pericardial effusion.  Impressions:  - Severe concentric left ventricular hypertrophy with mildly   decreased LVEF and diffuse hypokinesis.   A cardiac MRI is recommended to evaluate for possible   infiltrative cardiomyopathy.     Cath 01/06/2017 Conclusion      LV end diastolic pressure is moderately elevated consistent with moderately elevated mean pulmonary pressures.  Hemodynamic findings consistent with mild secondary pulmonary hypertension.  Angiographically normal coronary arteries with a right dominant system. Somewhat tortuous RCA and circumflex/ramus systems    Nonischemic Cardiomyopathy with moderate to severely reduced cardiac output/index. Persistently elevated filling pressures indicating continued volume overload  Plan:  Return to nursing unit for ongoing care and TR band removal. The brachial sheath was already removed in the Cath Lab  Per discussion with Dr. Swaziland, we will start her on oral Lasix 40 mg daily starting today.  Further recommendations per Dr. Elvis Coil consultation.      ASSESSMENT:    1. Chronic systolic heart failure (HCC)   2. Essential hypertension   3. Hyperlipidemia, unspecified hyperlipidemia type   4. Controlled type 2 diabetes mellitus without complication, without long-term current use of insulin (HCC)   5. OSA on CPAP      PLAN:  In order of problems listed above:  1. Chronic systolic heart failure: Unclear what exactly triggers a recent episode. She seems to be back on the original dose of torsemide which was her prehospital dose. She is doing very well on the current dose, she does have a scale at home, I asked her to monitor her weight daily basis and take additional 20 mg torsemide if her weight increased by more than 3 pounds overnight or 5 pounds in a single week. We also discussed the need to avoid salt and limit total amount of fluid intake to less than 2 L per day.  2. Hypertension: Her blood pressure is very well controlled. Her Zebeta was recently discontinued as she was bradycardic down to the 40s in the hospital. Her heart rate has improved. She is currently on the Imdur/hydralazine combination for LV improvement  3. Hyperlipidemia: On Pravachol 40 mg daily  4. DM 2:  Per primary care physician, I do not see she is taking any medications. Her recent hemoglobin A1c in January was 5.5. Question if this diagnosis is accurate.    Medication Adjustments/Labs and Tests Ordered: Current medicines are reviewed at length with the patient today.  Concerns regarding medicines are outlined above.  Medication changes, Labs and Tests ordered today are listed in the Patient Instructions below. Patient Instructions  Heart Failure Prevention Plan: 1. Avoid salt 2. Limit fluid intake to < 2 Liters daily 3. Weigh yourself every morning and keep a weight diary, you can take additional 20mg  Torsemide if weight increase by more than 3 lbs overnight or 5 lbs in a single week   Follow  up with Dr. Swaziland as scheduled.    Ramond Dial, [place]  04/04/2017 10:35 PM    [facility] [address] [place], [place], [place]  [address] Phone: [phone]; Fax: [phone]

## 2017-04-05 DIAGNOSIS — I429 Cardiomyopathy, unspecified: Secondary | ICD-10-CM | POA: Diagnosis not present

## 2017-04-05 DIAGNOSIS — N183 Chronic kidney disease, stage 3 (moderate): Secondary | ICD-10-CM | POA: Diagnosis not present

## 2017-04-05 DIAGNOSIS — I130 Hypertensive heart and chronic kidney disease with heart failure and stage 1 through stage 4 chronic kidney disease, or unspecified chronic kidney disease: Secondary | ICD-10-CM | POA: Diagnosis not present

## 2017-04-05 DIAGNOSIS — I447 Left bundle-branch block, unspecified: Secondary | ICD-10-CM | POA: Diagnosis not present

## 2017-04-05 DIAGNOSIS — J449 Chronic obstructive pulmonary disease, unspecified: Secondary | ICD-10-CM | POA: Diagnosis not present

## 2017-04-05 DIAGNOSIS — I5043 Acute on chronic combined systolic (congestive) and diastolic (congestive) heart failure: Secondary | ICD-10-CM | POA: Diagnosis not present

## 2017-04-07 DIAGNOSIS — I130 Hypertensive heart and chronic kidney disease with heart failure and stage 1 through stage 4 chronic kidney disease, or unspecified chronic kidney disease: Secondary | ICD-10-CM | POA: Diagnosis not present

## 2017-04-07 DIAGNOSIS — I429 Cardiomyopathy, unspecified: Secondary | ICD-10-CM | POA: Diagnosis not present

## 2017-04-07 DIAGNOSIS — N183 Chronic kidney disease, stage 3 (moderate): Secondary | ICD-10-CM | POA: Diagnosis not present

## 2017-04-07 DIAGNOSIS — I5043 Acute on chronic combined systolic (congestive) and diastolic (congestive) heart failure: Secondary | ICD-10-CM | POA: Diagnosis not present

## 2017-04-07 DIAGNOSIS — I447 Left bundle-branch block, unspecified: Secondary | ICD-10-CM | POA: Diagnosis not present

## 2017-04-07 DIAGNOSIS — J449 Chronic obstructive pulmonary disease, unspecified: Secondary | ICD-10-CM | POA: Diagnosis not present

## 2017-04-08 DIAGNOSIS — J449 Chronic obstructive pulmonary disease, unspecified: Secondary | ICD-10-CM | POA: Diagnosis not present

## 2017-04-08 DIAGNOSIS — I130 Hypertensive heart and chronic kidney disease with heart failure and stage 1 through stage 4 chronic kidney disease, or unspecified chronic kidney disease: Secondary | ICD-10-CM | POA: Diagnosis not present

## 2017-04-08 DIAGNOSIS — I447 Left bundle-branch block, unspecified: Secondary | ICD-10-CM | POA: Diagnosis not present

## 2017-04-08 DIAGNOSIS — N183 Chronic kidney disease, stage 3 (moderate): Secondary | ICD-10-CM | POA: Diagnosis not present

## 2017-04-08 DIAGNOSIS — I5043 Acute on chronic combined systolic (congestive) and diastolic (congestive) heart failure: Secondary | ICD-10-CM | POA: Diagnosis not present

## 2017-04-08 DIAGNOSIS — I429 Cardiomyopathy, unspecified: Secondary | ICD-10-CM | POA: Diagnosis not present

## 2017-04-11 DIAGNOSIS — J449 Chronic obstructive pulmonary disease, unspecified: Secondary | ICD-10-CM | POA: Diagnosis not present

## 2017-04-11 DIAGNOSIS — I429 Cardiomyopathy, unspecified: Secondary | ICD-10-CM | POA: Diagnosis not present

## 2017-04-11 DIAGNOSIS — I130 Hypertensive heart and chronic kidney disease with heart failure and stage 1 through stage 4 chronic kidney disease, or unspecified chronic kidney disease: Secondary | ICD-10-CM | POA: Diagnosis not present

## 2017-04-11 DIAGNOSIS — N183 Chronic kidney disease, stage 3 (moderate): Secondary | ICD-10-CM | POA: Diagnosis not present

## 2017-04-11 DIAGNOSIS — I5043 Acute on chronic combined systolic (congestive) and diastolic (congestive) heart failure: Secondary | ICD-10-CM | POA: Diagnosis not present

## 2017-04-11 DIAGNOSIS — I447 Left bundle-branch block, unspecified: Secondary | ICD-10-CM | POA: Diagnosis not present

## 2017-04-12 DIAGNOSIS — I429 Cardiomyopathy, unspecified: Secondary | ICD-10-CM | POA: Diagnosis not present

## 2017-04-12 DIAGNOSIS — N183 Chronic kidney disease, stage 3 (moderate): Secondary | ICD-10-CM | POA: Diagnosis not present

## 2017-04-12 DIAGNOSIS — I130 Hypertensive heart and chronic kidney disease with heart failure and stage 1 through stage 4 chronic kidney disease, or unspecified chronic kidney disease: Secondary | ICD-10-CM | POA: Diagnosis not present

## 2017-04-12 DIAGNOSIS — I5043 Acute on chronic combined systolic (congestive) and diastolic (congestive) heart failure: Secondary | ICD-10-CM | POA: Diagnosis not present

## 2017-04-12 DIAGNOSIS — J449 Chronic obstructive pulmonary disease, unspecified: Secondary | ICD-10-CM | POA: Diagnosis not present

## 2017-04-12 DIAGNOSIS — I447 Left bundle-branch block, unspecified: Secondary | ICD-10-CM | POA: Diagnosis not present

## 2017-04-14 DIAGNOSIS — N183 Chronic kidney disease, stage 3 (moderate): Secondary | ICD-10-CM | POA: Diagnosis not present

## 2017-04-14 DIAGNOSIS — I447 Left bundle-branch block, unspecified: Secondary | ICD-10-CM | POA: Diagnosis not present

## 2017-04-14 DIAGNOSIS — I429 Cardiomyopathy, unspecified: Secondary | ICD-10-CM | POA: Diagnosis not present

## 2017-04-14 DIAGNOSIS — I130 Hypertensive heart and chronic kidney disease with heart failure and stage 1 through stage 4 chronic kidney disease, or unspecified chronic kidney disease: Secondary | ICD-10-CM | POA: Diagnosis not present

## 2017-04-14 DIAGNOSIS — I5043 Acute on chronic combined systolic (congestive) and diastolic (congestive) heart failure: Secondary | ICD-10-CM | POA: Diagnosis not present

## 2017-04-14 DIAGNOSIS — J449 Chronic obstructive pulmonary disease, unspecified: Secondary | ICD-10-CM | POA: Diagnosis not present

## 2017-04-15 NOTE — Progress Notes (Signed)
Kelly [name] was seen today in the movement disorders clinic for neurologic consultation at the request of Sharon Seller, NP.  The consultation is for the evaluation of tremor.   Pt reports that she saw a physician in 2010 in [place] and was told she had PD and was placed on medication (she doesn't know what medication).  She does state that the medication didn't help.  She wasn't even at the doctor for PD and was sent for sleep apnea and the doctor told her she had signs of PD by "the way she sat."   She does remember she took the medication one time a day. She was then seen in [facility] a year later and was told she did not have PD and was taken off of medication.  Tremor just started a year ago.    Tremor: Yes.     How long has it been going on? 1 year ago  At rest or with activation?  rest  Fam hx of tremor?  Yes.  , brother, dad, paternal GM  Located where?  Bilateral UE and both started at same time  Affected by caffeine:  No.  Affected by alcohol:  Doesn't drink EtOH  Affected by stress:  Yes.    Affected by fatigue:  No.  Spills soup if on spoon:  No.  Spills glass of liquid if full:  No.  Affects ADL's (tying shoes, brushing teeth, etc):  Yes.  , and trouble buttoning clothing (wondering if CTS)   Any other sx's: Voice: no change Sleep: sleeping well  Vivid Dreams:  No.  Acting out dreams:  No. Wet Pillows: No. Postural symptoms:  Yes.    Falls?  No. Bradykinesia symptoms: difficulty getting out of a chair (attributes to back surgery) Loss of smell:  No. Loss of taste:  No. Urinary Incontinence:  No. (urinary urgency) Difficulty Swallowing:  No. Handwriting, micrographia: Yes.   Depression:  Yes.   Memory changes:  No. Hallucinations:  No.  visual distortions: No. N/V:  No. Lightheaded:  No.  Syncope: No. Diplopia:  No. Dyskinesia:  No.  Neuroimaging has not previously been performed.    PREVIOUS MEDICATIONS: yes but cannot remember what (was a once per  day medication)  ALLERGIES:  No Known Allergies  CURRENT MEDICATIONS:  Outpatient Encounter Prescriptions as of 04/19/2017  Medication Sig  . acetaminophen (TYLENOL) 500 MG tablet Take 500 mg by mouth every 6 (six) hours as needed.  Marland Kitchen ADVAIR DISKUS 500-50 MCG/DOSE AEPB INHALE 1 PUFF INTO THE LUNGS 2 (TWO) TIMES DAILY.  Marland Kitchen allopurinol (ZYLOPRIM) 100 MG tablet Take 1 tablet (100 mg total) by mouth daily.  Marland Kitchen aspirin 325 MG tablet Take 325 mg by mouth daily.  . Cholecalciferol (VITAMIN D3) 1000 units CAPS Take 1,000 Units by mouth daily.  . ferrous sulfate 325 (65 FE) MG EC tablet Take 325 mg by mouth daily with breakfast.   . hydrALAZINE (APRESOLINE) 25 MG tablet Take 3 tablets (75 mg total) by mouth every 8 (eight) hours.  Marland Kitchen HYDROcodone-acetaminophen (NORCO/VICODIN) 5-325 MG tablet Take 1 tablet by mouth every 6 (six) hours as needed for moderate pain.  . INCRUSE ELLIPTA 62.5 MCG/INH AEPB INHALE 1 PUFF INTO THE LUNGS DAILY.  . isosorbide mononitrate (IMDUR) 30 MG 24 hr tablet Take 1 tablet (30 mg total) by mouth daily.  . potassium chloride SA (KLOR-CON M20) 20 MEQ tablet Take 1 tablet (20 mEq total) by mouth daily.  . pravastatin (PRAVACHOL) 40 MG tablet  Take 1 tablet (40 mg total) by mouth daily.  . sertraline (ZOLOFT) 25 MG tablet TAKE 1 TABLET BY MOUTH EVERY DAY  . torsemide (DEMADEX) 20 MG tablet Take 1 tablet (20 mg total) by mouth daily.  . vitamin B-12 (CYANOCOBALAMIN) 1000 MCG tablet Take 1,000 mcg by mouth daily.   No facility-administered encounter medications on file as of 04/19/2017.     PAST MEDICAL HISTORY:   Past Medical History:  Diagnosis Date  . Anemia   . Anginal pain (HCC)   . Arthritis    "legs, hands" (03/23/2017)  . CHF (congestive heart failure) (HCC)   . CKD (chronic kidney disease), stage III   . COPD (chronic obstructive pulmonary disease) (HCC)   . Depression   . GERD (gastroesophageal reflux disease)   . Heart murmur   . Hyperlipidemia   . Hypertension     . LBBB (left bundle branch block)   . NSTEMI (non-ST elevated myocardial infarction) (HCC) 1980s X 3; 12/2016   "light ones" (03/23/2017); [name] 01/11/2017  . OSA on CPAP    "since 2013"  . Pneumonia 1980s X 1; 12/2016  . Type II diabetes mellitus (HCC)     PAST SURGICAL HISTORY:   Past Surgical History:  Procedure Laterality Date  . ABDOMINAL HYSTERECTOMY  1969  . APPENDECTOMY  1940  . BACK SURGERY    . CARDIAC CATHETERIZATION N/A 01/06/2017   Procedure: Right/Left Heart Cath and Coronary Angiography;  Surgeon: Marykay Lex, MD;  Location: [facility] INVASIVE CV LAB;  Service: Cardiovascular;  Laterality: N/A;  . CARPAL TUNNEL RELEASE Right   . CATARACT EXTRACTION W/ INTRAOCULAR LENS  IMPLANT, BILATERAL Bilateral   . COLONOSCOPY  2008   Negative  . DILATION AND CURETTAGE OF UTERUS  X 3  . LUMBAR LAMINECTOMY Right ?2000 - 2003 X 2   "bone spurs both times"  . TONSILLECTOMY      SOCIAL HISTORY:   Social History   Social History  . Marital status: Widowed    Spouse name: N/A  . Number of children: N/A  . Years of education: N/A   Occupational History  . Not on file.   Social History Main Topics  . Smoking status: Never Smoker  . Smokeless tobacco: Never Used  . Alcohol use No  . Drug use: No  . Sexual activity: No   Other Topics Concern  . Not on file   Social History Narrative   Social History      Diet? never      Do you drink/eat things with caffeine? coffee      Marital status?        widow                            What year were you married? 1955      Do you live in a house, apartment, assisted living, condo, trailer, etc.? house      Is it one or more stories? 2      How many persons live in your home? 3      Do you have any pets in your home? (please list) no      Highest level of education completed? 12 grade      Current or past profession: no      Advanced Directives      Do you exercise?      no  Type & how  often?      Do you have a living will? no       Do you have a DNR form?    no                              If not, do you want to discuss one?      Do you have signed POA/HPOA for forms?  no      Functional Status      Do you have difficulty bathing or dressing yourself? no      Do you have difficulty preparing food or eating? no      Do you have difficulty managing your medications? no      Do you have difficulty managing your finances? no      Do you have difficulty affording your medications?             FAMILY HISTORY:   Family Status  Relation Status  . Father Deceased  . Mother Deceased  . Brother Alive  . Brother Alive  . Brother Alive  . Son Alive  . Son Alive  . Son Alive  . Daughter Alive  . Neg Hx     ROS:  Intermittent CP/SOB.  No lateralizing weakness/paresthesias.  Some LE edema.  A complete 10 system review of systems was obtained and was unremarkable apart from what is mentioned above.  PHYSICAL EXAMINATION:    VITALS:   Vitals:   04/19/17 0838  BP: 110/66  Pulse: 78  SpO2: 94%  Weight: 176 lb (79.8 kg)  Height: 5\' 7"  (1.702 m)    GEN:  The patient appears stated age and is in NAD. HEENT:  Normocephalic, atraumatic.  The mucous membranes are moist. The superficial temporal arteries are without ropiness or tenderness. CV:  RRR with 2/6 SEM Lungs:  CTAB Neck/HEME:  There are no carotid bruits bilaterally. Derm:  There is swelling of the top of the L foot  Neurological examination:  Orientation: The patient is alert and oriented x3. Fund of knowledge is appropriate.  Recent and remote memory are intact.  Attention and concentration are normal.    Able to name objects and repeat phrases. Cranial nerves: There is good facial symmetry. Pupils are equal round and reactive to light bilaterally. Fundoscopic exam reveals clear margins bilaterally. Extraocular muscles are intact. The visual fields are full to confrontational testing. The speech is  fluent and clear.  She is hypophonic Soft palate rises symmetrically and there is no tongue deviation. Hearing is intact to conversational tone. Sensation: Sensation is intact to light and pinprick throughout (facial, trunk, extremities). Vibration is absent at the bilateral big toe and knee. There is no extinction with double simultaneous stimulation. There is no sensory dermatomal level identified. Motor: Strength is 5/5 in the bilateral upper and lower extremities.   Shoulder shrug is equal and symmetric.  There is no pronator drift. Deep tendon reflexes: Deep tendon reflexes are 2/4 at the bilateral biceps, triceps, brachioradialis, patella and absent at the bilateral achilles. Plantar responses are downgoing bilaterally.  Movement examination: Tone: Pt has difficulty relaxing to assess tone but no notable increased tone Abnormal movements: no tremor noted even with distraction procedures.  No rest tremor.  No tremor of outstretched hands or with intention. Coordination:  There is no decremation with RAM's, with any form of RAMS, including alternating supination and pronation of the forearm, hand opening  and closing, finger taps, heel taps and toe taps. Gait and Station: The patient pushes off the chair to arise.  She is wide-based.  Her stride length is just slightly decreased.  She uses a cane to ambulate.  She is mildly unsteady, but does fairly well with the cane.    Labs:  Lab Results  Component Value Date   TSH 0.669 03/23/2017     Chemistry      Component Value Date/Time   NA 140 03/29/2017 1539   NA 148 (A) 01/27/2017   K 3.9 03/29/2017 1539   CL 101 03/29/2017 1539   CO2 24 03/29/2017 1539   BUN 32 (H) 03/29/2017 1539   BUN 35 (A) 01/27/2017   CREATININE 1.43 (H) 03/29/2017 1539   GLU 104 01/27/2017      Component Value Date/Time   CALCIUM 9.9 03/29/2017 1539   ALKPHOS 73 02/07/2017 1033   AST 19 02/07/2017 1033   ALT 19 02/07/2017 1033   BILITOT 0.5 02/07/2017 1033      No results found for: VITAMINB12   ASSESSMENT/PLAN:  1.  Tremor, by history  -I saw no tremor today.  The patient is most concerned by the possibility of Parkinson's disease.  She was apparently diagnosed with this in 2010, but the diagnosis was retracted by a different neurologist in 2011.  I do not see evidence of this today.  I provided reassurance.  She really does not want any medication for tremor.  I am certainly happy to see her in the future if this progresses and gets worse.  2.  Diabetic peripheral neuropathy  -The patient has clinical examination evidence of a diffuse peripheral neuropathy, which certainly can affect gait and balance.  We discussed safety associated with peripheral neuropathy.  We discussed balance therapy and the importance of ambulatory assistive device for balance assistance.  She is using a cane.  She does not wish to attend balance therapy right now.   3.  I will follow-up with her on an as-needed basis.  Greater than 50% to 45 minute visit was spent in counseling.  Cc:  Sharon Seller, NP

## 2017-04-16 DIAGNOSIS — I509 Heart failure, unspecified: Secondary | ICD-10-CM | POA: Diagnosis not present

## 2017-04-16 DIAGNOSIS — E119 Type 2 diabetes mellitus without complications: Secondary | ICD-10-CM | POA: Diagnosis not present

## 2017-04-16 DIAGNOSIS — I10 Essential (primary) hypertension: Secondary | ICD-10-CM | POA: Diagnosis not present

## 2017-04-16 DIAGNOSIS — M109 Gout, unspecified: Secondary | ICD-10-CM | POA: Diagnosis not present

## 2017-04-19 ENCOUNTER — Ambulatory Visit (INDEPENDENT_AMBULATORY_CARE_PROVIDER_SITE_OTHER): Payer: Medicare Other | Admitting: Neurology

## 2017-04-19 ENCOUNTER — Encounter: Payer: Self-pay | Admitting: Neurology

## 2017-04-19 VITALS — BP 110/66 | HR 78 | Ht 67.0 in | Wt 176.0 lb

## 2017-04-19 DIAGNOSIS — E1142 Type 2 diabetes mellitus with diabetic polyneuropathy: Secondary | ICD-10-CM | POA: Diagnosis not present

## 2017-04-19 DIAGNOSIS — R251 Tremor, unspecified: Secondary | ICD-10-CM

## 2017-04-19 DIAGNOSIS — I25118 Atherosclerotic heart disease of native coronary artery with other forms of angina pectoris: Secondary | ICD-10-CM | POA: Diagnosis not present

## 2017-04-20 DIAGNOSIS — N183 Chronic kidney disease, stage 3 (moderate): Secondary | ICD-10-CM | POA: Diagnosis not present

## 2017-04-20 DIAGNOSIS — I447 Left bundle-branch block, unspecified: Secondary | ICD-10-CM | POA: Diagnosis not present

## 2017-04-20 DIAGNOSIS — I429 Cardiomyopathy, unspecified: Secondary | ICD-10-CM | POA: Diagnosis not present

## 2017-04-20 DIAGNOSIS — J449 Chronic obstructive pulmonary disease, unspecified: Secondary | ICD-10-CM | POA: Diagnosis not present

## 2017-04-20 DIAGNOSIS — I5043 Acute on chronic combined systolic (congestive) and diastolic (congestive) heart failure: Secondary | ICD-10-CM | POA: Diagnosis not present

## 2017-04-20 DIAGNOSIS — I130 Hypertensive heart and chronic kidney disease with heart failure and stage 1 through stage 4 chronic kidney disease, or unspecified chronic kidney disease: Secondary | ICD-10-CM | POA: Diagnosis not present

## 2017-04-21 ENCOUNTER — Encounter: Payer: Self-pay | Admitting: Nurse Practitioner

## 2017-04-21 ENCOUNTER — Ambulatory Visit
Admission: RE | Admit: 2017-04-21 | Discharge: 2017-04-21 | Disposition: A | Discharge status: Home / Self Care | Payer: Medicare Other | Source: Ambulatory Visit | Attending: Nurse Practitioner | Admitting: Nurse Practitioner

## 2017-04-21 ENCOUNTER — Ambulatory Visit (INDEPENDENT_AMBULATORY_CARE_PROVIDER_SITE_OTHER): Payer: Medicare Other | Admitting: Nurse Practitioner

## 2017-04-21 VITALS — BP 114/72 | HR 76 | Temp 97.9°F | Resp 18 | Ht 67.0 in | Wt 175.8 lb

## 2017-04-21 DIAGNOSIS — M10272 Drug-induced gout, left ankle and foot: Secondary | ICD-10-CM | POA: Diagnosis not present

## 2017-04-21 DIAGNOSIS — I25118 Atherosclerotic heart disease of native coronary artery with other forms of angina pectoris: Secondary | ICD-10-CM | POA: Diagnosis not present

## 2017-04-21 MED ORDER — PREDNISONE 20 MG PO TABS: ORAL_TABLET | ORAL | 0 refills | Status: DC

## 2017-04-21 MED ORDER — FEBUXOSTAT 40 MG PO TABS: 40.0000 mg | ORAL_TABLET | Freq: Every day | ORAL | 1 refills | Status: DC

## 2017-04-21 NOTE — Progress Notes (Signed)
Careteam: Patient Care Team: Sharon Seller, NP as PCP - General (Geriatric Medicine)  Advanced Directive information Does Patient Have a Medical Advance Directive?: No  No Known Allergies  Chief Complaint  Patient presents with  . Acute Visit    Pt is being seen for increased ankle swelling. Pt went to Urgent Care due to pain on 04/16/17- unknown cause of swelling/pain.      HPI: Patient is a 83 y.o. female seen in the office today due to increase in ankle swelling. Pt was seen on 4/17 for hospital follow up and had increased pain and swelling to left ankle, she was treated with prednisone and found to have elevated uric acid level and therefore started on allopurinol.  ~10 days ago ankle started to hurt and then 7 days ago started to swell. She went to the urgent care and gave her a pain medication via injection.  Pt reports they did not address the issue of the swelling or cause but the pain itself. Gave her hydrocodone PRN, did not take one this morning because the pain is improving but still having pain;  Swelling has also improved but still swollen  Review of Systems:  Review of Systems  Constitutional: Negative for activity change, appetite change, chills, diaphoresis, fatigue, fever and unexpected weight change.  Respiratory: Negative for cough, chest tightness, shortness of breath and wheezing.   Cardiovascular: Negative for chest pain, palpitations and leg swelling.  Gastrointestinal: Negative for abdominal pain, constipation, diarrhea, nausea and vomiting.  Genitourinary: Negative for difficulty urinating.  Musculoskeletal: Positive for joint swelling (to left ankle).  Skin: Negative for color change, rash and wound.  Neurological: Negative for dizziness and tremors.  Psychiatric/Behavioral: Negative for agitation, behavioral problems and confusion. The patient is not nervous/anxious.     Past Medical History:  Diagnosis Date  . Anemia   . Anginal pain (HCC)     . Arthritis    "legs, hands" (03/23/2017)  . CHF (congestive heart failure) (HCC)   . CKD (chronic kidney disease), stage III   . COPD (chronic obstructive pulmonary disease) (HCC)   . Depression   . GERD (gastroesophageal reflux disease)   . Heart murmur   . Hyperlipidemia   . Hypertension   . LBBB (left bundle branch block)   . NSTEMI (non-ST elevated myocardial infarction) (HCC) 1980s X 3; 12/2016   "light ones" (03/23/2017); [name] 01/11/2017  . OSA on CPAP    "since 2013"  . Pneumonia 1980s X 1; 12/2016  . Type II diabetes mellitus (HCC)    Past Surgical History:  Procedure Laterality Date  . ABDOMINAL HYSTERECTOMY  1969  . APPENDECTOMY  1940  . BACK SURGERY    . CARDIAC CATHETERIZATION N/A 01/06/2017   Procedure: Right/Left Heart Cath and Coronary Angiography;  Surgeon: Marykay Lex, MD;  Location: [facility] INVASIVE CV LAB;  Service: Cardiovascular;  Laterality: N/A;  . CARPAL TUNNEL RELEASE Right   . CATARACT EXTRACTION W/ INTRAOCULAR LENS  IMPLANT, BILATERAL Bilateral   . COLONOSCOPY  2008   Negative  . DILATION AND CURETTAGE OF UTERUS  X 3  . LUMBAR LAMINECTOMY Right ?2000 - 2003 X 2   "bone spurs both times"  . TONSILLECTOMY     Social History:   reports that she has never smoked. She has never used smokeless tobacco. She reports that she does not drink alcohol or use drugs.  Family History  Problem Relation Age of Onset  . Hypertension Father   .  Stroke Father   . Kidney failure Mother   . Dementia Brother   . COPD Neg Hx   . Heart disease Neg Hx   . Diabetes Neg Hx     Medications: Patient's Medications  New Prescriptions   FEBUXOSTAT (ULORIC) 40 MG TABLET    Take 1 tablet (40 mg total) by mouth daily.   PREDNISONE (DELTASONE) 20 MG TABLET    Take 3 tablets today, 2 tablets tomorrow then 1 tablet daily for 5 days for a total of 7 days  Previous Medications   ACETAMINOPHEN (TYLENOL) 500 MG TABLET    Take 500 mg by mouth every 6 (six) hours as needed.    ADVAIR DISKUS 500-50 MCG/DOSE AEPB    INHALE 1 PUFF INTO THE LUNGS 2 (TWO) TIMES DAILY.   ASPIRIN 325 MG TABLET    Take 325 mg by mouth daily.   CHOLECALCIFEROL (VITAMIN D3) 1000 UNITS CAPS    Take 1,000 Units by mouth daily.   FERROUS SULFATE 325 (65 FE) MG EC TABLET    Take 325 mg by mouth daily with breakfast.    HYDRALAZINE (APRESOLINE) 25 MG TABLET    Take 3 tablets (75 mg total) by mouth every 8 (eight) hours.   HYDROCODONE-ACETAMINOPHEN (NORCO/VICODIN) 5-325 MG TABLET    Take 1 tablet by mouth every 6 (six) hours as needed for moderate pain.   INCRUSE ELLIPTA 62.5 MCG/INH AEPB    INHALE 1 PUFF INTO THE LUNGS DAILY.   ISOSORBIDE MONONITRATE (IMDUR) 30 MG 24 HR TABLET    Take 1 tablet (30 mg total) by mouth daily.   POTASSIUM CHLORIDE SA (KLOR-CON M20) 20 MEQ TABLET    Take 1 tablet (20 mEq total) by mouth daily.   PRAVASTATIN (PRAVACHOL) 40 MG TABLET    Take 1 tablet (40 mg total) by mouth daily.   SERTRALINE (ZOLOFT) 25 MG TABLET    TAKE 1 TABLET BY MOUTH EVERY DAY   TORSEMIDE (DEMADEX) 20 MG TABLET    Take 1 tablet (20 mg total) by mouth daily.   VITAMIN B-12 (CYANOCOBALAMIN) 1000 MCG TABLET    Take 1,000 mcg by mouth daily.  Modified Medications   No medications on file  Discontinued Medications   ALLOPURINOL (ZYLOPRIM) 100 MG TABLET    Take 1 tablet (100 mg total) by mouth daily.     Physical Exam:  Vitals:   04/21/17 1420  BP: 114/72  Pulse: 76  Resp: 18  Temp: 97.9 F (36.6 C)  TempSrc: Oral  SpO2: 96%  Weight: 175 lb 12.8 oz (79.7 kg)  Height: 5\' 7"  (1.702 m)   Body mass index is 27.53 kg/m.  Physical Exam  Constitutional: She is oriented to person, place, and time. She appears well-developed and well-nourished. No distress.  HENT:  Head: Normocephalic and atraumatic.  Mouth/Throat: Oropharynx is clear and moist.  Eyes: Conjunctivae and EOM are normal. Pupils are equal, round, and reactive to light.  Neck: Neck supple. No JVD present.  Cardiovascular: Normal  rate, regular rhythm, normal heart sounds and intact distal pulses.  PMI is not displaced.   No murmur heard. Pulses:      Radial pulses are 2+ on the right side, and 2+ on the left side.       Dorsalis pedis pulses are 1+ on the right side, and 1+ on the left side.  Pulmonary/Chest: Effort normal and breath sounds normal. No respiratory distress. She has no wheezes. She exhibits no tenderness.  Musculoskeletal: She exhibits no  edema.       Left ankle: She exhibits decreased range of motion and swelling. She exhibits no ecchymosis and no deformity. Tenderness. Achilles tendon normal.  Neurological: She is alert and oriented to person, place, and time.  Skin: Skin is warm and dry. She is not diaphoretic. No erythema.  Psychiatric: She has a normal mood and affect. Her behavior is normal. Judgment and thought content normal.  Flat affect    Labs reviewed: Basic Metabolic Panel:  Recent Labs  36/14/43 1609 01/02/17 0226  03/23/17 1347 03/24/17 0305 03/25/17 0324 03/29/17 1539  NA  --  142  < >  --  140 140 140  K  --  2.9*  < >  --  4.0 3.1* 3.9  CL  --  104  < >  --  105 102 101  CO2  --  27  < >  --  26 27 24   GLUCOSE  --  152*  < >  --  119* 111* 87  BUN  --  30*  < >  --  41* 36* 32*  CREATININE  --  1.39*  < >  --  1.56* 1.46* 1.43*  CALCIUM  --  8.9  < >  --  9.0 8.9 9.9  MG  --  2.1  --   --  1.3*  --   --   PHOS 4.5  --   --   --   --   --   --   TSH  --   --   --  0.669  --   --   --   < > = values in this interval not displayed. Liver Function Tests:  Recent Labs  01/01/17 1046 02/07/17 1033  AST 44* 19  ALT 57* 19  ALKPHOS 82 73  BILITOT 1.1 0.5  PROT 8.2* 7.0  ALBUMIN 3.9 3.9   No results for input(s): LIPASE, AMYLASE in the last 8760 hours. No results for input(s): AMMONIA in the last 8760 hours. CBC:  Recent Labs  01/01/17 1046  02/04/17 1120 03/22/17 1453 03/23/17 0305  WBC 20.8*  < > 10.7 8.6 8.2  NEUTROABS 9.5*  --  6,955  --   --   HGB  10.4*  < > 11.7 10.5* 10.3*  HCT 33.6*  < > 36.8 33.4* 32.5*  MCV 94.1  < > 93.6 93.3 93.7  PLT 232  < > 267 192 155  < > = values in this interval not displayed. Lipid Panel:  Recent Labs  02/07/17 1033  CHOL 195  HDL 66  LDLCALC 106*  TRIG 113  CHOLHDL 3.0   TSH:  Recent Labs  03/23/17 1347  TSH 0.669   A1C: Lab Results  Component Value Date   HGBA1C 5.5 01/01/2017     Assessment/Plan 1. Acute drug-induced gout of left ankle - febuxostat (ULORIC) 40 MG tablet; Take 1 tablet (40 mg total) by mouth daily.  Dispense: 30 tablet; Refill: 1 - predniSONE (DELTASONE) 20 MG tablet; Take 3 tablets today, 2 tablets tomorrow then 1 tablet daily for 5 days for a total of 7 days  Dispense: 10 tablet; Refill: 0 - DG Ankle Complete Left; Future  Follow up with Dr Renato Gails in 1 month, sooner if needed   Janene Harvey. Biagio Borg  [facility] [phone] 8 am - 5 pm) [phone] (after hours)

## 2017-04-21 NOTE — Patient Instructions (Addendum)
Will treat with prednisone 60 mg today, 40 mg tomorrow then to take 20 mg daily for 5 days  To start uloric 40 mg daily for gout Will get xray of ankle to make sure nothing else is going on

## 2017-04-26 DIAGNOSIS — J449 Chronic obstructive pulmonary disease, unspecified: Secondary | ICD-10-CM | POA: Diagnosis not present

## 2017-04-26 DIAGNOSIS — N183 Chronic kidney disease, stage 3 (moderate): Secondary | ICD-10-CM | POA: Diagnosis not present

## 2017-04-26 DIAGNOSIS — I5043 Acute on chronic combined systolic (congestive) and diastolic (congestive) heart failure: Secondary | ICD-10-CM | POA: Diagnosis not present

## 2017-04-26 DIAGNOSIS — I447 Left bundle-branch block, unspecified: Secondary | ICD-10-CM | POA: Diagnosis not present

## 2017-04-26 DIAGNOSIS — I130 Hypertensive heart and chronic kidney disease with heart failure and stage 1 through stage 4 chronic kidney disease, or unspecified chronic kidney disease: Secondary | ICD-10-CM | POA: Diagnosis not present

## 2017-04-26 DIAGNOSIS — I429 Cardiomyopathy, unspecified: Secondary | ICD-10-CM | POA: Diagnosis not present

## 2017-04-28 ENCOUNTER — Ambulatory Visit: Payer: Medicare Other | Admitting: Internal Medicine

## 2017-05-03 DIAGNOSIS — I429 Cardiomyopathy, unspecified: Secondary | ICD-10-CM | POA: Diagnosis not present

## 2017-05-03 DIAGNOSIS — I5043 Acute on chronic combined systolic (congestive) and diastolic (congestive) heart failure: Secondary | ICD-10-CM | POA: Diagnosis not present

## 2017-05-03 DIAGNOSIS — I447 Left bundle-branch block, unspecified: Secondary | ICD-10-CM | POA: Diagnosis not present

## 2017-05-03 DIAGNOSIS — N183 Chronic kidney disease, stage 3 (moderate): Secondary | ICD-10-CM | POA: Diagnosis not present

## 2017-05-03 DIAGNOSIS — J449 Chronic obstructive pulmonary disease, unspecified: Secondary | ICD-10-CM | POA: Diagnosis not present

## 2017-05-03 DIAGNOSIS — I130 Hypertensive heart and chronic kidney disease with heart failure and stage 1 through stage 4 chronic kidney disease, or unspecified chronic kidney disease: Secondary | ICD-10-CM | POA: Diagnosis not present

## 2017-05-03 NOTE — Progress Notes (Signed)
Cardiology Office Note    Date:  05/05/2017   ID:  Kelly [name], DOB [birth year], MRN [id number]  PCP:  Sharon Seller, NP  Cardiologist:  Allyanna Appleman [place], MD    History of Present Illness:  Kelly [name] is a 83 y.o. female seen for  follow up of CHF. She was admitted 1/20-1/27/18 for acute respiratory failure with hypoxia secondary to acute combined systolic and diastolic CHF. She has a history of COPD, apparent old left bundle branch block, and reported history of "3 heart attacks" in the past. States that she followed with a physician in [place] for prior cardiac events, does not indicate any prior cardiac catheterization or PCI. She presented with worsening shortness of breath  with coughing. Chest x-ray was concerning for possible associated pneumonia versus asymmetric edema. She was treated with  Antibiotics. Cardiac enzymes suggest NSTEMI with peak troponin 4.91. Echo showed EF 35-40% with severe LVH and restrictive parameters. Cardiac cath showed pulmonary HTN with elevated filling pressures. Normal coronary anatomy.  She was diuresed with improvement. Started on bisoprolol (dose reduced due to bradycardia). ARB held due to ARF.   She was admitted in April with CHF exacerbation. She was diuresed with IV lasix and transitioned back to torsemide po. Bystolic held due to bradycardia. Started on Imdur and hydralazine. When seen later in April she seemed to be doing well.   On follow up today she is doing OK. Denies any SOB, chest pain, orthopnea, PND. Weighs herself daily.  Weight is down 4 lbs since ast visit. She does note mild edema in her left ankle. It is a little sore.  She is following sodium restriction.      Past Medical History:  Diagnosis Date  . Anemia   . Anginal pain (HCC)   . Arthritis    "legs, hands" (03/23/2017)  . CHF (congestive heart failure) (HCC)   . CKD (chronic kidney disease), stage III   . COPD (chronic obstructive pulmonary disease) (HCC)   .  Depression   . GERD (gastroesophageal reflux disease)   . Heart murmur   . Hyperlipidemia   . Hypertension   . LBBB (left bundle branch block)   . NSTEMI (non-ST elevated myocardial infarction) (HCC) 1980s X 3; 12/2016   "light ones" (03/23/2017); [name] 01/11/2017  . OSA on CPAP    "since 2013"  . Pneumonia 1980s X 1; 12/2016  . Type II diabetes mellitus (HCC)     Past Surgical History:  Procedure Laterality Date  . ABDOMINAL HYSTERECTOMY  1969  . APPENDECTOMY  1940  . BACK SURGERY    . CARDIAC CATHETERIZATION N/A 01/06/2017   Procedure: Right/Left Heart Cath and Coronary Angiography;  Surgeon: Marykay Lex, MD;  Location: [facility] INVASIVE CV LAB;  Service: Cardiovascular;  Laterality: N/A;  . CARPAL TUNNEL RELEASE Right   . CATARACT EXTRACTION W/ INTRAOCULAR LENS  IMPLANT, BILATERAL Bilateral   . COLONOSCOPY  2008   Negative  . DILATION AND CURETTAGE OF UTERUS  X 3  . LUMBAR LAMINECTOMY Right ?2000 - 2003 X 2   "bone spurs both times"  . TONSILLECTOMY      Current Medications: Outpatient Medications Prior to Visit  Medication Sig Dispense Refill  . acetaminophen (TYLENOL) 500 MG tablet Take 500 mg by mouth every 6 (six) hours as needed.    Marland Kitchen ADVAIR DISKUS 500-50 MCG/DOSE AEPB INHALE 1 PUFF INTO THE LUNGS 2 (TWO) TIMES DAILY. 60 each 1  . aspirin 325 MG tablet Take 325 mg  by mouth daily.    . Cholecalciferol (VITAMIN D3) 1000 units CAPS Take 1,000 Units by mouth daily.    . febuxostat (ULORIC) 40 MG tablet Take 1 tablet (40 mg total) by mouth daily. 30 tablet 1  . ferrous sulfate 325 (65 FE) MG EC tablet Take 325 mg by mouth daily with breakfast.     . hydrALAZINE (APRESOLINE) 25 MG tablet Take 3 tablets (75 mg total) by mouth every 8 (eight) hours. 90 tablet 0  . potassium chloride SA (KLOR-CON M20) 20 MEQ tablet Take 1 tablet (20 mEq total) by mouth daily. 30 tablet 1  . pravastatin (PRAVACHOL) 40 MG tablet Take 1 tablet (40 mg total) by mouth daily. 30 tablet 0  . sertraline  (ZOLOFT) 25 MG tablet TAKE 1 TABLET BY MOUTH EVERY DAY 30 tablet 5  . torsemide (DEMADEX) 20 MG tablet Take 1 tablet (20 mg total) by mouth daily. 90 tablet 1  . vitamin B-12 (CYANOCOBALAMIN) 1000 MCG tablet Take 1,000 mcg by mouth daily.    . isosorbide mononitrate (IMDUR) 30 MG 24 hr tablet Take 1 tablet (30 mg total) by mouth daily. 30 tablet 0  . HYDROcodone-acetaminophen (NORCO/VICODIN) 5-325 MG tablet Take 1 tablet by mouth every 6 (six) hours as needed for moderate pain.    . INCRUSE ELLIPTA 62.5 MCG/INH AEPB INHALE 1 PUFF INTO THE LUNGS DAILY. 30 each 1  . predniSONE (DELTASONE) 20 MG tablet Take 3 tablets today, 2 tablets tomorrow then 1 tablet daily for 5 days for a total of 7 days 10 tablet 0   No facility-administered medications prior to visit.      Allergies:   Patient has no known allergies.   Social History   Social History  . Marital status: Widowed    Spouse name: N/A  . Number of children: N/A  . Years of education: N/A   Social History Main Topics  . Smoking status: Never Smoker  . Smokeless tobacco: Never Used  . Alcohol use No  . Drug use: No  . Sexual activity: No   Other Topics Concern  . None   Social History Narrative   Social History      Diet? never      Do you drink/eat things with caffeine? coffee      Marital status?        widow                            What year were you married? 1955      Do you live in a house, apartment, assisted living, condo, trailer, etc.? house      Is it one or more stories? 2      How many persons live in your home? 3      Do you have any pets in your home? (please list) no      Highest level of education completed? 12 grade      Current or past profession: no      Advanced Directives      Do you exercise?      no                                Type & how often?      Do you have a living will? no       Do you have a DNR form?  no                              If not, do you want to discuss one?       Do you have signed POA/HPOA for forms?  no      Functional Status      Do you have difficulty bathing or dressing yourself? no      Do you have difficulty preparing food or eating? no      Do you have difficulty managing your medications? no      Do you have difficulty managing your finances? no      Do you have difficulty affording your medications?              Family History:  The patient's family history includes Dementia in her brother; Hypertension in her father; Kidney failure in her mother; Stroke in her father.   ROS:   Please see the history of present illness.    ROS All other systems reviewed and are negative.   PHYSICAL EXAM:   VS:  BP (!) 146/70   Pulse (!) 56   Ht 5\' 7"  (1.702 m)   Wt 171 lb 9.6 oz (77.8 kg)   SpO2 97%   BMI 26.88 kg/m    GEN: Pleasant elderly BF, well developed, in no acute distress  HEENT: normal  Neck: no JVD, carotid bruits, or masses Cardiac: RRR; no murmurs, rubs, or gallops,. Normal S1-2. 1+ edema left ankle. Nontender.  Respiratory:  clear to auscultation bilaterally, normal work of breathing GI: soft, nontender, nondistended, + BS MS: no deformity or atrophy  Skin: warm and dry, no rash Neuro:  Alert and Oriented x 3, Strength and sensation are intact Psych: euthymic mood, full affect  Wt Readings from Last 3 Encounters:  05/05/17 171 lb 9.6 oz (77.8 kg)  04/21/17 175 lb 12.8 oz (79.7 kg)  04/19/17 176 lb (79.8 kg)      Studies/Labs Reviewed:   EKG:  EKG is not ordered today.  The ekg ordered today demonstrates N/A  Recent Labs: 02/04/2017: Brain Natriuretic Peptide 500.8 02/07/2017: ALT 19 03/23/2017: Hemoglobin 10.3; Platelets 155; TSH 0.669 03/24/2017: Magnesium 1.3 03/29/2017: BUN 32; Creat 1.43; Potassium 3.9; Sodium 140   Lipid Panel    Component Value Date/Time   CHOL 195 02/07/2017 1033   TRIG 113 02/07/2017 1033   HDL 66 02/07/2017 1033   CHOLHDL 3.0 02/07/2017 1033   VLDL 23 02/07/2017 1033    LDLCALC 106 (H) 02/07/2017 1033    Additional studies/ records that were reviewed today include:  Echo 01/03/17: Study Conclusions  - Left ventricle: The cavity size was normal. There was severe   concentric hypertrophy. Systolic function was moderately reduced.   The estimated ejection fraction was in the range of 35% to 40%.   Wall motion was normal; there were no regional wall motion   abnormalities. Features are consistent with a pseudonormal left   ventricular filling pattern, with concomitant abnormal relaxation   and increased filling pressure (grade 2 diastolic dysfunction).   Doppler parameters are consistent with elevated ventricular   end-diastolic filling pressure. - Aortic root: The aortic root was normal in size. - Mitral valve: There was mild regurgitation. - Left atrium: The atrium was moderately dilated. - Right ventricle: Systolic function was normal. - Right atrium: The atrium was moderately dilated. - Tricuspid valve: There was moderate regurgitation. - Pulmonic valve: There was  mild regurgitation. - Pulmonary arteries: PA peak pressure: 62 mm Hg (S). - Inferior vena cava: The vessel was dilated. The respirophasic   diameter changes were blunted (< 50%), consistent with elevated   central venous pressure. - Pericardium, extracardiac: There was no pericardial effusion.  Impressions:  - Severe concentric left ventricular hypertrophy with mildly   decreased LVEF and diffuse hypokinesis.   A cardiac MRI is recommended to evaluate for possible   infiltrative cardiomyopathy.  Cardiac cath 01/06/17: Conclusion     LV end diastolic pressure is moderately elevated consistent with moderately elevated mean pulmonary pressures.  Hemodynamic findings consistent with mild secondary pulmonary hypertension.  Angiographically normal coronary arteries with a right dominant system. Somewhat tortuous RCA and circumflex/ramus systems    Nonischemic Cardiomyopathy with  moderate to severely reduced cardiac output/index. Persistently elevated filling pressures indicating continued volume overload     ASSESSMENT:    1. Chronic systolic heart failure (HCC)   2. Essential hypertension   3. Hyperlipidemia, unspecified hyperlipidemia type   4. LBBB (left bundle branch block)      PLAN:  In order of problems listed above:  1. CHF appears to be well compensated. Will continue current therapy with nitrates, hydralazine, and torsemide. Weight is down an additional 4 lbs.  Beta blocker held due to bradycardia.  Will hold ACEi/ARB/ aldactone with CKD.  I will follow up in 4 months. 2. Hypertensive heart disease with CHF. BP under fairly good control 3. CKD stage 3    Medication Adjustments/Labs and Tests Ordered: Current medicines are reviewed at length with the patient today.  Concerns regarding medicines are outlined above.  Medication changes, Labs and Tests ordered today are listed in the Patient Instructions below. Patient Instructions  Continue your current medications and restrict salt intake  Keep left foot elevated when possible.   I will see you in 4 months      Signed, Maylene Crocker [place], MD  05/05/2017 10:26 AM    [facility] [address] [place], [place], [address] [phone]

## 2017-05-05 ENCOUNTER — Encounter: Payer: Self-pay | Admitting: Cardiology

## 2017-05-05 ENCOUNTER — Ambulatory Visit (INDEPENDENT_AMBULATORY_CARE_PROVIDER_SITE_OTHER): Payer: Medicare Other | Admitting: Cardiology

## 2017-05-05 VITALS — BP 146/70 | HR 56 | Ht 67.0 in | Wt 171.6 lb

## 2017-05-05 DIAGNOSIS — I447 Left bundle-branch block, unspecified: Secondary | ICD-10-CM | POA: Diagnosis not present

## 2017-05-05 DIAGNOSIS — I5022 Chronic systolic (congestive) heart failure: Secondary | ICD-10-CM | POA: Diagnosis not present

## 2017-05-05 DIAGNOSIS — E785 Hyperlipidemia, unspecified: Secondary | ICD-10-CM

## 2017-05-05 DIAGNOSIS — I25118 Atherosclerotic heart disease of native coronary artery with other forms of angina pectoris: Secondary | ICD-10-CM | POA: Diagnosis not present

## 2017-05-05 DIAGNOSIS — I10 Essential (primary) hypertension: Secondary | ICD-10-CM

## 2017-05-05 MED ORDER — ISOSORBIDE MONONITRATE ER 30 MG PO TB24: 30.0000 mg | ORAL_TABLET | Freq: Every day | ORAL | 11 refills | Status: DC

## 2017-05-05 NOTE — Patient Instructions (Addendum)
Continue your current medications and restrict salt intake  Keep left foot elevated when possible.   I will see you in 4 months

## 2017-05-10 DIAGNOSIS — I130 Hypertensive heart and chronic kidney disease with heart failure and stage 1 through stage 4 chronic kidney disease, or unspecified chronic kidney disease: Secondary | ICD-10-CM | POA: Diagnosis not present

## 2017-05-10 DIAGNOSIS — I5043 Acute on chronic combined systolic (congestive) and diastolic (congestive) heart failure: Secondary | ICD-10-CM | POA: Diagnosis not present

## 2017-05-10 DIAGNOSIS — I429 Cardiomyopathy, unspecified: Secondary | ICD-10-CM | POA: Diagnosis not present

## 2017-05-10 DIAGNOSIS — N183 Chronic kidney disease, stage 3 (moderate): Secondary | ICD-10-CM | POA: Diagnosis not present

## 2017-05-10 DIAGNOSIS — I447 Left bundle-branch block, unspecified: Secondary | ICD-10-CM | POA: Diagnosis not present

## 2017-05-10 DIAGNOSIS — J449 Chronic obstructive pulmonary disease, unspecified: Secondary | ICD-10-CM | POA: Diagnosis not present

## 2017-05-13 ENCOUNTER — Other Ambulatory Visit: Payer: Self-pay | Admitting: Nurse Practitioner

## 2017-05-13 ENCOUNTER — Encounter: Payer: Self-pay | Admitting: Nurse Practitioner

## 2017-05-17 ENCOUNTER — Encounter: Payer: Self-pay | Admitting: Nurse Practitioner

## 2017-05-17 ENCOUNTER — Ambulatory Visit (INDEPENDENT_AMBULATORY_CARE_PROVIDER_SITE_OTHER): Payer: Medicare Other | Admitting: Nurse Practitioner

## 2017-05-17 VITALS — BP 148/86 | HR 85 | Temp 98.0°F | Resp 18 | Ht 67.0 in | Wt 172.8 lb

## 2017-05-17 DIAGNOSIS — M10279 Drug-induced gout, unspecified ankle and foot: Secondary | ICD-10-CM

## 2017-05-17 DIAGNOSIS — J441 Chronic obstructive pulmonary disease with (acute) exacerbation: Secondary | ICD-10-CM | POA: Diagnosis not present

## 2017-05-17 DIAGNOSIS — I110 Hypertensive heart disease with heart failure: Secondary | ICD-10-CM

## 2017-05-17 DIAGNOSIS — I5042 Chronic combined systolic (congestive) and diastolic (congestive) heart failure: Secondary | ICD-10-CM | POA: Diagnosis not present

## 2017-05-17 DIAGNOSIS — J449 Chronic obstructive pulmonary disease, unspecified: Secondary | ICD-10-CM | POA: Diagnosis not present

## 2017-05-17 DIAGNOSIS — I25118 Atherosclerotic heart disease of native coronary artery with other forms of angina pectoris: Secondary | ICD-10-CM | POA: Diagnosis not present

## 2017-05-17 DIAGNOSIS — M10272 Drug-induced gout, left ankle and foot: Secondary | ICD-10-CM | POA: Diagnosis not present

## 2017-05-17 MED ORDER — PRAVASTATIN SODIUM 40 MG PO TABS: 40.0000 mg | ORAL_TABLET | Freq: Every day | ORAL | 0 refills | Status: DC

## 2017-05-17 MED ORDER — UMECLIDINIUM BROMIDE 62.5 MCG/INH IN AEPB: 1.0000 | INHALATION_SPRAY | Freq: Every day | RESPIRATORY_TRACT | 3 refills | Status: AC

## 2017-05-17 MED ORDER — HYDRALAZINE HCL 25 MG PO TABS: 75.0000 mg | ORAL_TABLET | Freq: Three times a day (TID) | ORAL | 0 refills | Status: DC

## 2017-05-17 MED ORDER — PREDNISONE 20 MG PO TABS: ORAL_TABLET | ORAL | 0 refills | Status: DC

## 2017-05-17 MED ORDER — COLCHICINE 0.6 MG PO TABS: ORAL_TABLET | ORAL | 0 refills | Status: DC

## 2017-05-17 MED ORDER — FEBUXOSTAT 40 MG PO TABS: 40.0000 mg | ORAL_TABLET | Freq: Every day | ORAL | 0 refills | Status: DC

## 2017-05-17 NOTE — Patient Instructions (Signed)
ACUTE GOUT - to start colchicine 0.6 MG tablet; Every 2 hours until diarrhea (up to 5 tablets in 24 hours) then to start twice daily to help control uric acid Restart  predniSONE 20 MG tablet; 3 tablets today, 2 tablets tomorrow, then 1 tablet daily for 5 days

## 2017-05-17 NOTE — Progress Notes (Signed)
Careteam: Patient Care Team: Sharon Seller, NP as PCP - General (Geriatric Medicine)  Advanced Directive information Does Patient Have a Medical Advance Directive?: No  No Known Allergies  Chief Complaint  Patient presents with  . Acute Visit    Pt is being seen due to swelling of both feet. Pt states that she cannot get a shoe on right foot since swelling began 3 days ago.       HPI: Patient is a 83 y.o. female seen in the office today for swelling of both ankles.  Increase pain and swelling to both ankle.  The left ankle has been going on for about a month but right ankle has been getting worse for 3 days.  Not able to get shoes on.  No injury but very painful.  She was seen last month for acute gout flare, was placed on prednisone and uloric. Originally got better but now getting worse again Now right ankle is more painful, hard to walk.  Uric acid level was 12.0   Has lots of questions regarding medication Needs refills on ellipta and advair Not taking norvasc (on medication list)  Review of Systems:  Review of Systems  Constitutional: Negative for chills, fever and weight loss.  HENT: Negative for tinnitus.   Respiratory: Negative for cough, sputum production and shortness of breath.   Cardiovascular: Negative for chest pain and palpitations.  Gastrointestinal: Negative for abdominal pain, constipation, diarrhea and heartburn.  Genitourinary: Negative for dysuria, frequency and urgency.  Musculoskeletal: Positive for joint pain. Negative for back pain, falls and myalgias.  Skin: Negative.   Neurological: Negative for dizziness and headaches.  Psychiatric/Behavioral: Negative for depression and memory loss. The patient does not have insomnia.    Past Medical History:  Diagnosis Date  . Anemia   . Anginal pain (HCC)   . Arthritis    "legs, hands" (03/23/2017)  . CHF (congestive heart failure) (HCC)   . CKD (chronic kidney disease), stage III   . COPD  (chronic obstructive pulmonary disease) (HCC)   . Depression   . GERD (gastroesophageal reflux disease)   . Heart murmur   . Hyperlipidemia   . Hypertension   . LBBB (left bundle branch block)   . NSTEMI (non-ST elevated myocardial infarction) (HCC) 1980s X 3; 12/2016   "light ones" (03/23/2017); [name] 01/11/2017  . OSA on CPAP    "since 2013"  . Pneumonia 1980s X 1; 12/2016  . Type II diabetes mellitus (HCC)    Past Surgical History:  Procedure Laterality Date  . ABDOMINAL HYSTERECTOMY  1969  . APPENDECTOMY  1940  . BACK SURGERY    . CARDIAC CATHETERIZATION N/A 01/06/2017   Procedure: Right/Left Heart Cath and Coronary Angiography;  Surgeon: Marykay Lex, MD;  Location: [facility] INVASIVE CV LAB;  Service: Cardiovascular;  Laterality: N/A;  . CARPAL TUNNEL RELEASE Right   . CATARACT EXTRACTION W/ INTRAOCULAR LENS  IMPLANT, BILATERAL Bilateral   . COLONOSCOPY  2008   Negative  . DILATION AND CURETTAGE OF UTERUS  X 3  . LUMBAR LAMINECTOMY Right ?2000 - 2003 X 2   "bone spurs both times"  . TONSILLECTOMY     Social History:   reports that she has never smoked. She has never used smokeless tobacco. She reports that she does not drink alcohol or use drugs.  Family History  Problem Relation Age of Onset  . Hypertension Father   . Stroke Father   . Kidney failure Mother   .  Dementia Brother   . COPD Neg Hx   . Heart disease Neg Hx   . Diabetes Neg Hx     Medications: Patient's Medications  New Prescriptions   No medications on file  Previous Medications   ACETAMINOPHEN (TYLENOL) 500 MG TABLET    Take 500 mg by mouth every 6 (six) hours as needed.   ADVAIR DISKUS 500-50 MCG/DOSE AEPB    INHALE 1 PUFF INTO THE LUNGS 2 (TWO) TIMES DAILY.   AMLODIPINE (NORVASC) 5 MG TABLET    TAKE 1 TABLET AT BEDTIME   ASPIRIN 325 MG TABLET    Take 325 mg by mouth daily.   CHOLECALCIFEROL (VITAMIN D3) 1000 UNITS CAPS    Take 1,000 Units by mouth daily.   FEBUXOSTAT (ULORIC) 40 MG TABLET    Take 1  tablet (40 mg total) by mouth daily.   FERROUS SULFATE 325 (65 FE) MG EC TABLET    Take 325 mg by mouth daily with breakfast.    HYDRALAZINE (APRESOLINE) 25 MG TABLET    Take 3 tablets (75 mg total) by mouth every 8 (eight) hours.   ISOSORBIDE MONONITRATE (IMDUR) 30 MG 24 HR TABLET    Take 1 tablet (30 mg total) by mouth daily.   POTASSIUM CHLORIDE SA (KLOR-CON M20) 20 MEQ TABLET    Take 1 tablet (20 mEq total) by mouth daily.   PRAVASTATIN (PRAVACHOL) 40 MG TABLET    Take 1 tablet (40 mg total) by mouth daily.   SERTRALINE (ZOLOFT) 25 MG TABLET    TAKE 1 TABLET BY MOUTH EVERY DAY   TORSEMIDE (DEMADEX) 20 MG TABLET    Take 1 tablet (20 mg total) by mouth daily.   VITAMIN B-12 (CYANOCOBALAMIN) 1000 MCG TABLET    Take 1,000 mcg by mouth daily.  Modified Medications   No medications on file  Discontinued Medications   No medications on file     Physical Exam:  Vitals:   05/17/17 1411  BP: (!) 148/86  Pulse: 85  Resp: 18  Temp: 98 F (36.7 C)  TempSrc: Oral  SpO2: 98%  Weight: 172 lb 12.8 oz (78.4 kg)  Height: 5\' 7"  (1.702 m)   Body mass index is 27.06 kg/m.  Physical Exam  Constitutional: She is oriented to person, place, and time. She appears well-developed and well-nourished. No distress.  HENT:  Head: Normocephalic and atraumatic.  Mouth/Throat: Oropharynx is clear and moist.  Eyes: Conjunctivae and EOM are normal. Pupils are equal, round, and reactive to light.  Neck: Neck supple. No JVD present.  Cardiovascular: Normal rate, regular rhythm, normal heart sounds and intact distal pulses.  PMI is not displaced.   No murmur heard. Pulses:      Radial pulses are 2+ on the right side, and 2+ on the left side.       Dorsalis pedis pulses are 1+ on the right side, and 1+ on the left side.  Pulmonary/Chest: Effort normal and breath sounds normal. No respiratory distress. She has no wheezes. She exhibits no tenderness.  Musculoskeletal: She exhibits edema.       Right ankle:  She exhibits decreased range of motion and swelling. She exhibits no ecchymosis. Tenderness.       Left ankle: She exhibits decreased range of motion and swelling. She exhibits no ecchymosis and no deformity. Tenderness. Achilles tendon normal.  Neurological: She is alert and oriented to person, place, and time.  Skin: Skin is warm and dry. She is not diaphoretic. No erythema.  Psychiatric: She has a normal mood and affect. Her behavior is normal. Judgment and thought content normal.  Flat affect    Labs reviewed: Basic Metabolic Panel:  Recent Labs  71/16/57 1609 01/02/17 0226  03/23/17 1347 03/24/17 0305 03/25/17 0324 03/29/17 1539  NA  --  142  < >  --  140 140 140  K  --  2.9*  < >  --  4.0 3.1* 3.9  CL  --  104  < >  --  105 102 101  CO2  --  27  < >  --  26 27 24   GLUCOSE  --  152*  < >  --  119* 111* 87  BUN  --  30*  < >  --  41* 36* 32*  CREATININE  --  1.39*  < >  --  1.56* 1.46* 1.43*  CALCIUM  --  8.9  < >  --  9.0 8.9 9.9  MG  --  2.1  --   --  1.3*  --   --   PHOS 4.5  --   --   --   --   --   --   TSH  --   --   --  0.669  --   --   --   < > = values in this interval not displayed.  CrCl cannot be calculated (Patient's most recent lab result is older than the maximum 21 days allowed.).    Liver Function Tests:  Recent Labs  01/01/17 1046 02/07/17 1033  AST 44* 19  ALT 57* 19  ALKPHOS 82 73  BILITOT 1.1 0.5  PROT 8.2* 7.0  ALBUMIN 3.9 3.9   No results for input(s): LIPASE, AMYLASE in the last 8760 hours. No results for input(s): AMMONIA in the last 8760 hours. CBC:  Recent Labs  01/01/17 1046  02/04/17 1120 03/22/17 1453 03/23/17 0305  WBC 20.8*  < > 10.7 8.6 8.2  NEUTROABS 9.5*  --  6,955  --   --   HGB 10.4*  < > 11.7 10.5* 10.3*  HCT 33.6*  < > 36.8 33.4* 32.5*  MCV 94.1  < > 93.6 93.3 93.7  PLT 232  < > 267 192 155  < > = values in this interval not displayed. Lipid Panel:  Recent Labs  02/07/17 1033  CHOL 195  HDL 66  LDLCALC  106*  TRIG 113  CHOLHDL 3.0   TSH:  Recent Labs  03/23/17 1347  TSH 0.669   A1C: Lab Results  Component Value Date   HGBA1C 5.5 01/01/2017     Assessment/Plan 1. Acute drug-induced gout of ankle, unspecified laterality Worse, will restart prednisone taper along with colchicine.  To cont uloric  - colchicine 0.6 MG tablet; Every 2 hours until diarrhea (up to 5 tablets) then cut back to twice daily  Dispense: 90 tablet; Refill: 0 - predniSONE (DELTASONE) 20 MG tablet; 3 tablets today, 2 tablets tomorrow, then 1 tablet daily for 5 days  Dispense: 10 tablet; Refill: 0  2. COPD without exacerbation (HCC) Stable. - umeclidinium bromide (INCRUSE ELLIPTA) 62.5 MCG/INH AEPB; Inhale 1 puff into the lungs daily.  Dispense: 30 each; Refill: 3  3. Hypertensive heart disease with chronic combined systolic and diastolic congestive heart failure (HCC) Following with cardiology, without signs of fluid overload, no shortness of breath or leg edema.  - hydrALAZINE (APRESOLINE) 25 MG tablet; Take 3 tablets (75 mg total) by mouth every 8 (eight)  hours.  Dispense: 90 tablet; Refill: 0  To keep follow up with Dr Renato Gails as schedule. Follow up sooner if needed   Laurene Melendrez K. Biagio Borg  [facility] [phone] 8 am - 5 pm) [phone] (after hours)

## 2017-05-19 ENCOUNTER — Telehealth: Payer: Self-pay

## 2017-05-19 NOTE — Telephone Encounter (Signed)
I called patient to see how she was feeling since her office visit on Tuesday. I left a message asking that patient call the office.

## 2017-05-20 MED ORDER — FLUTICASONE-SALMETEROL 500-50 MCG/DOSE IN AEPB: 1.0000 | INHALATION_SPRAY | Freq: Two times a day (BID) | RESPIRATORY_TRACT | 3 refills | Status: AC

## 2017-05-25 ENCOUNTER — Other Ambulatory Visit: Payer: Self-pay | Admitting: Nurse Practitioner

## 2017-05-25 DIAGNOSIS — I110 Hypertensive heart disease with heart failure: Secondary | ICD-10-CM

## 2017-05-25 DIAGNOSIS — I25118 Atherosclerotic heart disease of native coronary artery with other forms of angina pectoris: Secondary | ICD-10-CM

## 2017-06-02 ENCOUNTER — Ambulatory Visit (INDEPENDENT_AMBULATORY_CARE_PROVIDER_SITE_OTHER): Payer: Medicare Other | Admitting: Internal Medicine

## 2017-06-02 ENCOUNTER — Encounter: Payer: Self-pay | Admitting: Internal Medicine

## 2017-06-02 VITALS — BP 128/70 | HR 68 | Temp 98.4°F | Wt 169.0 lb

## 2017-06-02 DIAGNOSIS — I25118 Atherosclerotic heart disease of native coronary artery with other forms of angina pectoris: Secondary | ICD-10-CM | POA: Diagnosis not present

## 2017-06-02 DIAGNOSIS — I5042 Chronic combined systolic (congestive) and diastolic (congestive) heart failure: Secondary | ICD-10-CM | POA: Diagnosis not present

## 2017-06-02 DIAGNOSIS — E1122 Type 2 diabetes mellitus with diabetic chronic kidney disease: Secondary | ICD-10-CM

## 2017-06-02 DIAGNOSIS — I420 Dilated cardiomyopathy: Secondary | ICD-10-CM | POA: Diagnosis not present

## 2017-06-02 DIAGNOSIS — R634 Abnormal weight loss: Secondary | ICD-10-CM

## 2017-06-02 DIAGNOSIS — N183 Chronic kidney disease, stage 3 unspecified: Secondary | ICD-10-CM

## 2017-06-02 DIAGNOSIS — I110 Hypertensive heart disease with heart failure: Secondary | ICD-10-CM

## 2017-06-02 DIAGNOSIS — M779 Enthesopathy, unspecified: Secondary | ICD-10-CM | POA: Diagnosis not present

## 2017-06-02 DIAGNOSIS — M7750 Other enthesopathy of unspecified foot: Secondary | ICD-10-CM | POA: Insufficient documentation

## 2017-06-02 DIAGNOSIS — J449 Chronic obstructive pulmonary disease, unspecified: Secondary | ICD-10-CM | POA: Diagnosis not present

## 2017-06-02 DIAGNOSIS — M1A2720 Drug-induced chronic gout, left ankle and foot, without tophus (tophi): Secondary | ICD-10-CM

## 2017-06-02 NOTE — Patient Instructions (Signed)
Please start eating a bedtime snack like peanut butter crackers to make sure your sugar does not drop overnight and into the morning.

## 2017-06-02 NOTE — Progress Notes (Signed)
Location:  [facility] clinic Provider:  Christofer Shen L. Renato Gails, D.O., C.M.D.  Goals of Care:  Advanced Directives 06/02/2017  Does Patient Have a Medical Advance Directive? No  Type of Advance Directive -  Would patient like information on creating a medical advance directive? No - Patient declined     Chief Complaint  Patient presents with  . Medical Management of Chronic Issues    4 week follow-up    HPI: Patient is a 83 y.o. Kelly [name] seen today for medical management of chronic diseases--appt was originally scheduled to f/u on her gout in her ankles.  Last uric acid was 12.    Left ankle and foot still painful, but better than they were.  Has a bone spur on the heel and calcification of achilles tendon.    Left leg stays numb in the morning.  Wakes up over the day.  Takes about 15 minutes to wake up a bit.  She's had a few back surgeries.    Sugars have been usually 100 something, but for a week, in the 80s-90s.  She has been done with the prednisone taper.    Appetite is good.  Eats three meals per day.  Has not had a snack before.  She has 4 lbs.  She is trying to lose--eating a healthier diet.  Does exercise some.      Past Medical History:  Diagnosis Date  . Anemia   . Anginal pain (HCC)   . Arthritis    "legs, hands" (03/23/2017)  . CHF (congestive heart failure) (HCC)   . CKD (chronic kidney disease), stage III   . COPD (chronic obstructive pulmonary disease) (HCC)   . Depression   . GERD (gastroesophageal reflux disease)   . Heart murmur   . Hyperlipidemia   . Hypertension   . LBBB (left bundle branch block)   . NSTEMI (non-ST elevated myocardial infarction) (HCC) 1980s X 3; 12/2016   "light ones" (03/23/2017); [name] 01/11/2017  . OSA on CPAP    "since 2013"  . Pneumonia 1980s X 1; 12/2016  . Type II diabetes mellitus (HCC)     Past Surgical History:  Procedure Laterality Date  . ABDOMINAL HYSTERECTOMY  1969  . APPENDECTOMY  1940  . BACK SURGERY    . CARDIAC  CATHETERIZATION N/A 01/06/2017   Procedure: Right/Left Heart Cath and Coronary Angiography;  Surgeon: Marykay Lex, MD;  Location: [facility] INVASIVE CV LAB;  Service: Cardiovascular;  Laterality: N/A;  . CARPAL TUNNEL RELEASE Right   . CATARACT EXTRACTION W/ INTRAOCULAR LENS  IMPLANT, BILATERAL Bilateral   . COLONOSCOPY  2008   Negative  . DILATION AND CURETTAGE OF UTERUS  X 3  . LUMBAR LAMINECTOMY Right ?2000 - 2003 X 2   "bone spurs both times"  . TONSILLECTOMY      No Known Allergies  Allergies as of 06/02/2017   No Known Allergies     Medication List       Accurate as of 06/02/17  8:09 AM. Always use your most recent med list.          acetaminophen 500 MG tablet Commonly known as:  TYLENOL Take 500 mg by mouth every 6 (six) hours as needed.   aspirin 325 MG tablet Take 325 mg by mouth daily.   colchicine 0.6 MG tablet Every 2 hours until diarrhea (up to 5 tablets) then cut back to twice daily   febuxostat 40 MG tablet Commonly known as:  ULORIC Take 1 tablet (40  mg total) by mouth daily.   ferrous sulfate 325 (65 FE) MG EC tablet Take 325 mg by mouth daily with breakfast.   Fluticasone-Salmeterol 500-50 MCG/DOSE Aepb Commonly known as:  ADVAIR DISKUS Inhale 1 puff into the lungs 2 (two) times daily.   hydrALAZINE 25 MG tablet Commonly known as:  APRESOLINE Take 3 tablets (75 mg total) by mouth every 8 (eight) hours.   isosorbide mononitrate 30 MG 24 hr tablet Commonly known as:  IMDUR Take 1 tablet (30 mg total) by mouth daily.   potassium chloride SA 20 MEQ tablet Commonly known as:  KLOR-CON M20 Take 1 tablet (20 mEq total) by mouth daily.   pravastatin 40 MG tablet Commonly known as:  PRAVACHOL Take 1 tablet (40 mg total) by mouth daily.   sertraline 25 MG tablet Commonly known as:  ZOLOFT TAKE 1 TABLET BY MOUTH EVERY DAY   torsemide 20 MG tablet Commonly known as:  DEMADEX Take 1 tablet (20 mg total) by mouth daily.   torsemide 10 MG  tablet Commonly known as:  DEMADEX TAKE 1 TABLET (10 MG TOTAL) BY MOUTH DAILY.   umeclidinium bromide 62.5 MCG/INH Aepb Commonly known as:  INCRUSE ELLIPTA Inhale 1 puff into the lungs daily.   vitamin B-12 1000 MCG tablet Commonly known as:  CYANOCOBALAMIN Take 1,000 mcg by mouth daily.   Vitamin D3 1000 units Caps Take 1,000 Units by mouth daily.       Review of Systems:  Review of Systems  Constitutional: Positive for weight loss. Negative for chills and fever.  HENT: Negative for congestion.   Eyes: Negative for blurred vision.       Glasses  Respiratory: Negative for shortness of breath.   Cardiovascular: Negative for chest pain, palpitations and leg swelling.  Gastrointestinal: Negative for abdominal pain.  Genitourinary: Negative for dysuria.  Musculoskeletal: Positive for joint pain. Negative for falls.  Skin: Negative for itching and rash.  Neurological: Negative for dizziness.  Psychiatric/Behavioral: Negative for depression.    Health Maintenance  Topic Date Due  . FOOT EXAM  06/27/1941  . OPHTHALMOLOGY EXAM  06/27/1941  . URINE MICROALBUMIN  06/27/1941  . DEXA SCAN  06/26/1998  . HEMOGLOBIN A1C  07/01/2017  . INFLUENZA VACCINE  07/13/2017  . PNA vac Low Risk Adult (2 of 2 - PCV13) 09/13/2017  . TETANUS/TDAP  12/14/2025    Physical Exam: Vitals:   06/02/17 0808  BP: 128/70  Pulse: 68  Temp: 98.4 F (36.9 C)  TempSrc: Oral  SpO2: 96%  Weight: 169 lb (76.7 kg)   Body mass index is 26.47 kg/m. Physical Exam  Constitutional: She is oriented to person, place, and time. She appears well-developed and well-nourished. No distress.  Cardiovascular: Normal rate, regular rhythm, normal heart sounds and intact distal pulses.   Pulmonary/Chest: Effort normal and breath sounds normal. No respiratory distress.  Abdominal: Soft. Bowel sounds are normal.  Musculoskeletal: Normal range of motion.  Tenderness of dorsum of bilateral feet near ankles, not over  achilles or ankles themselves, mild persistent edema left foot  Neurological: She is alert and oriented to person, place, and time.  Skin: Skin is warm and dry. Capillary refill takes less than 2 seconds.  Psychiatric: She has a normal mood and affect.    Labs reviewed: Basic Metabolic Panel:  Recent Labs  16/10/96 1609 01/02/17 0226  03/23/17 1347 03/24/17 0305 03/25/17 0324 03/29/17 1539  NA  --  142  < >  --  140 140 140  K  --  2.9*  < >  --  4.0 3.1* 3.9  CL  --  104  < >  --  105 102 101  CO2  --  27  < >  --  26 27 24   GLUCOSE  --  152*  < >  --  119* 111* 87  BUN  --  30*  < >  --  41* 36* 32*  CREATININE  --  1.39*  < >  --  1.56* 1.46* 1.43*  CALCIUM  --  8.9  < >  --  9.0 8.9 9.9  MG  --  2.1  --   --  1.3*  --   --   PHOS 4.5  --   --   --   --   --   --   TSH  --   --   --  0.669  --   --   --   < > = values in this interval not displayed. Liver Function Tests:  Recent Labs  01/01/17 1046 02/07/17 1033  AST 44* 19  ALT 57* 19  ALKPHOS Kelly 73  BILITOT 1.1 0.5  PROT 8.2* 7.0  ALBUMIN 3.9 3.9   No results for input(s): LIPASE, AMYLASE in the last 8760 hours. No results for input(s): AMMONIA in the last 8760 hours. CBC:  Recent Labs  01/01/17 1046  02/04/17 1120 03/22/17 1453 03/23/17 0305  WBC 20.8*  < > 10.7 8.6 8.2  NEUTROABS 9.5*  --  6,955  --   --   HGB 10.4*  < > 11.7 10.5* 10.3*  HCT 33.6*  < > 36.8 33.4* 32.5*  MCV 94.1  < > 93.6 93.3 93.7  PLT 232  < > 267 192 155  < > = values in this interval not displayed. Lipid Panel:  Recent Labs  02/07/17 1033  CHOL 195  HDL 66  LDLCALC 106*  TRIG 113  CHOLHDL 3.0   Lab Results  Component Value Date   HGBA1C 5.5 01/01/2017    Assessment/Plan 1. Chronic kidney disease, stage 3 - needs f/u lab - CBC with Differential/Platelet; Future - COMPLETE METABOLIC PANEL WITH GFR; Future  2. COPD without exacerbation (HCC) -stable, not wheezing, cont current therapy  3. Controlled type 2  diabetes mellitus with stage 3 chronic kidney disease, without long-term current use of insulin (HCC) - last hba1c in Jan was normal, but pt reports diabetes history -having am relative lows so counseled to eat hs snack--need labs - Lipid panel; Future - Hemoglobin A1c; Future  4. 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 Enthesopathy of ankle -improved but does have spurs on ankle  6. Nonischemic congestive cardiomyopathy (HCC) -ongoing, stable, no related complaints  7. Hypertensive heart disease with chronic combined systolic and diastolic congestive heart failure (HCC) -bp at goal today, no changes  8. Weight loss - is intentional by report, cont healthy balanced diet, but take night time snack - TSH; Future  Labs/tests ordered:   Orders Placed This Encounter  Procedures  . Uric Acid    Standing Status:   Future    Standing Expiration Date:   07/12/2017  . Lipid panel    Standing Status:   Future    Standing Expiration Date:   07/12/2017  . TSH    Standing Status:   Future  Standing Expiration Date:   07/12/2017  . Hemoglobin A1c    Standing Status:   Future    Standing Expiration Date:   07/12/2017  . CBC with Differential/Platelet    Standing Status:   Future    Standing Expiration Date:   07/12/2017  . COMPLETE METABOLIC PANEL WITH GFR    Standing Status:   Future    Standing Expiration Date:   07/12/2017   Next appt:  07/12/2017   Saim Almanza L. Myrka Sylva, D.O. Geriatrics [facility] [address], [place] [address] Cell Phone (Mon-Fri 8am-5pm):  [phone] On Call:  [phone] & follow prompts after 5pm & weekends Office Phone:  [phone] Office Fax:  [phone]

## 2017-06-09 ENCOUNTER — Telehealth: Payer: Self-pay | Admitting: *Deleted

## 2017-06-09 DIAGNOSIS — M10272 Drug-induced gout, left ankle and foot: Secondary | ICD-10-CM

## 2017-06-09 DIAGNOSIS — M1000 Idiopathic gout, unspecified site: Secondary | ICD-10-CM

## 2017-06-09 DIAGNOSIS — M10279 Drug-induced gout, unspecified ankle and foot: Secondary | ICD-10-CM

## 2017-06-09 MED ORDER — COLCHICINE 0.6 MG PO TABS: ORAL_TABLET | ORAL | 0 refills | Status: DC

## 2017-06-09 NOTE — Telephone Encounter (Signed)
Patient called and stated that since starting the gout medication she has had diarrhea. Has been worse since Tuesday with cramping. Patient has cut the medication back to twice daily. Please Advise.

## 2017-06-09 NOTE — Telephone Encounter (Signed)
May decrease to once daily Lets have her come in to office and follow up uric acid level as well.  Hopefully gout/ankle pain has improved

## 2017-06-09 NOTE — Telephone Encounter (Signed)
Patient notified and agreed. Medication list updated and order placed for labs. Scheduled lab appointment.

## 2017-06-10 ENCOUNTER — Other Ambulatory Visit: Payer: Medicare Other

## 2017-06-10 DIAGNOSIS — N183 Chronic kidney disease, stage 3 unspecified: Secondary | ICD-10-CM

## 2017-06-10 DIAGNOSIS — R634 Abnormal weight loss: Secondary | ICD-10-CM

## 2017-06-10 DIAGNOSIS — M1000 Idiopathic gout, unspecified site: Secondary | ICD-10-CM

## 2017-06-10 DIAGNOSIS — E1122 Type 2 diabetes mellitus with diabetic chronic kidney disease: Secondary | ICD-10-CM

## 2017-06-10 LAB — COMPLETE METABOLIC PANEL WITH GFR
ALT: 66 U/L — ABNORMAL HIGH (ref 6–29)
AST: 69 U/L — ABNORMAL HIGH (ref 10–35)
Albumin: 3.8 g/dL (ref 3.6–5.1)
Alkaline Phosphatase: 88 U/L (ref 33–130)
BUN: 43 mg/dL — ABNORMAL HIGH (ref 7–25)
CO2: 24 mmol/L (ref 20–31)
Calcium: 9.1 mg/dL (ref 8.6–10.4)
Chloride: 106 mmol/L (ref 98–110)
Creat: 1.72 mg/dL — ABNORMAL HIGH (ref 0.60–0.88)
GFR, Est African American: 31 mL/min — ABNORMAL LOW (ref >=60)
GFR, Est Non African American: 27 mL/min — ABNORMAL LOW (ref >=60)
Glucose, Bld: 94 mg/dL (ref 65–99)
Potassium: 3.8 mmol/L (ref 3.5–5.3)
Sodium: 143 mmol/L (ref 135–146)
Total Bilirubin: 0.4 mg/dL (ref 0.2–1.2)
Total Protein: 6.4 g/dL (ref 6.1–8.1)

## 2017-06-10 LAB — TSH: TSH: 1.59 mIU/L

## 2017-06-10 LAB — CBC WITH DIFFERENTIAL/PLATELET
Basophils Absolute: 0 cells/uL (ref 0–200)
Basophils Relative: 0 %
Eosinophils Absolute: 136 cells/uL (ref 15–500)
Eosinophils Relative: 4 %
HCT: 34.8 % — ABNORMAL LOW (ref 35.0–45.0)
Hemoglobin: 11.2 g/dL — ABNORMAL LOW (ref 11.7–15.5)
Lymphocytes Relative: 38 %
Lymphs Abs: 1292 cells/uL (ref 850–3900)
MCH: 29.2 pg (ref 27.0–33.0)
MCHC: 32.2 g/dL (ref 32.0–36.0)
MCV: 90.6 fL (ref 80.0–100.0)
Monocytes Absolute: 306 cells/uL (ref 200–950)
Monocytes Relative: 9 %
Neutro Abs: 1666 cells/uL (ref 1500–7800)
Neutrophils Relative %: 49 %
Platelets: 146 10*3/uL (ref 140–400)
RBC: 3.84 MIL/uL (ref 3.80–5.10)
RDW: 15.6 % — ABNORMAL HIGH (ref 11.0–15.0)
WBC: 3.4 10*3/uL — ABNORMAL LOW (ref 3.8–10.8)

## 2017-06-11 LAB — URIC ACID: URIC ACID, SERUM: 7.2 mg/dL — AB (ref 2.5–7.0)

## 2017-06-11 LAB — HEMOGLOBIN A1C
Hgb A1c MFr Bld: 6.3 % — ABNORMAL HIGH (ref <5.7)
Mean Plasma Glucose: 134 mg/dL

## 2017-06-13 ENCOUNTER — Other Ambulatory Visit: Payer: Medicare Other

## 2017-06-13 DIAGNOSIS — N183 Chronic kidney disease, stage 3 (moderate): Secondary | ICD-10-CM | POA: Diagnosis not present

## 2017-06-13 DIAGNOSIS — E1122 Type 2 diabetes mellitus with diabetic chronic kidney disease: Secondary | ICD-10-CM

## 2017-06-13 LAB — LIPID PANEL
Cholesterol: 144 mg/dL (ref <200)
HDL: 38 mg/dL — ABNORMAL LOW (ref >50)
LDL Cholesterol: 86 mg/dL (ref <100)
Total CHOL/HDL Ratio: 3.8 Ratio (ref <5.0)
Triglycerides: 102 mg/dL (ref <150)
VLDL: 20 mg/dL (ref <30)

## 2017-06-16 ENCOUNTER — Ambulatory Visit: Payer: Medicare Other | Admitting: Nurse Practitioner

## 2017-06-16 ENCOUNTER — Ambulatory Visit: Payer: Medicare Other

## 2017-06-21 ENCOUNTER — Other Ambulatory Visit: Payer: Self-pay | Admitting: Nurse Practitioner

## 2017-06-21 DIAGNOSIS — I110 Hypertensive heart disease with heart failure: Secondary | ICD-10-CM

## 2017-06-21 DIAGNOSIS — I25118 Atherosclerotic heart disease of native coronary artery with other forms of angina pectoris: Secondary | ICD-10-CM

## 2017-06-23 ENCOUNTER — Other Ambulatory Visit: Payer: Self-pay | Admitting: Nurse Practitioner

## 2017-06-23 DIAGNOSIS — I5042 Chronic combined systolic (congestive) and diastolic (congestive) heart failure: Principal | ICD-10-CM

## 2017-06-23 DIAGNOSIS — I110 Hypertensive heart disease with heart failure: Secondary | ICD-10-CM

## 2017-07-12 ENCOUNTER — Ambulatory Visit (INDEPENDENT_AMBULATORY_CARE_PROVIDER_SITE_OTHER): Payer: Medicare Other

## 2017-07-12 ENCOUNTER — Encounter: Payer: Self-pay | Admitting: Nurse Practitioner

## 2017-07-12 ENCOUNTER — Ambulatory Visit (INDEPENDENT_AMBULATORY_CARE_PROVIDER_SITE_OTHER): Payer: Medicare Other | Admitting: Nurse Practitioner

## 2017-07-12 VITALS — BP 125/60 | HR 66 | Temp 98.0°F | Resp 17 | Ht 67.0 in | Wt 171.0 lb

## 2017-07-12 VITALS — BP 125/60 | HR 66 | Temp 98.0°F | Ht 67.0 in | Wt 171.0 lb

## 2017-07-12 DIAGNOSIS — F339 Major depressive disorder, recurrent, unspecified: Secondary | ICD-10-CM | POA: Diagnosis not present

## 2017-07-12 DIAGNOSIS — E2839 Other primary ovarian failure: Secondary | ICD-10-CM

## 2017-07-12 DIAGNOSIS — N183 Chronic kidney disease, stage 3 unspecified: Secondary | ICD-10-CM

## 2017-07-12 DIAGNOSIS — Z0000 Encounter for general adult medical examination without abnormal findings: Secondary | ICD-10-CM

## 2017-07-12 DIAGNOSIS — F329 Major depressive disorder, single episode, unspecified: Secondary | ICD-10-CM | POA: Diagnosis not present

## 2017-07-12 DIAGNOSIS — Z23 Encounter for immunization: Secondary | ICD-10-CM

## 2017-07-12 DIAGNOSIS — M1000 Idiopathic gout, unspecified site: Secondary | ICD-10-CM | POA: Diagnosis not present

## 2017-07-12 DIAGNOSIS — I25118 Atherosclerotic heart disease of native coronary artery with other forms of angina pectoris: Secondary | ICD-10-CM

## 2017-07-12 DIAGNOSIS — E1122 Type 2 diabetes mellitus with diabetic chronic kidney disease: Secondary | ICD-10-CM

## 2017-07-12 DIAGNOSIS — F32A Depression, unspecified: Secondary | ICD-10-CM

## 2017-07-12 MED ORDER — SERTRALINE HCL 50 MG PO TABS: 50.0000 mg | ORAL_TABLET | Freq: Every day | ORAL | 1 refills | Status: DC

## 2017-07-12 NOTE — Patient Instructions (Signed)
Kelly [name] , Thank you for taking time to come for your Medicare Wellness Visit. I appreciate your ongoing commitment to your health goals. Please review the following plan we discussed and let me know if I can assist you in the future.   Screening recommendations/referrals: Colonoscopy excluded, patient over age 81 Mammogram  excluded, patient over age 31 Bone Density up to date  Recommended yearly ophthalmology/optometry visit for glaucoma screening and checkup Recommended yearly dental visit for hygiene and checkup  Vaccinations: Influenza vaccine due 2018 fall season Pneumococcal vaccine 13 given, up to date Tdap vaccine up to date, due 12/14/2025 Shingles vaccine prescription sent to pharmacy  Advanced directives: Advance directive discussed with you today. You have a copy at home.  Once this is complete please bring a copy in to our office so we can scan it into your chart.   Conditions/risks identified: none  Next appointment: Kelly [name], 07/12/17 @ 1:30 pm.    Preventive Care 65 Years and Older, Female Preventive care refers to lifestyle choices and visits with your health care provider that can promote health and wellness. What does preventive care include?  A yearly physical exam. This is also called an annual well check.  Dental exams once or twice a year.  Routine eye exams. Ask your health care provider how often you should have your eyes checked.  Personal lifestyle choices, including:  Daily care of your teeth and gums.  Regular physical activity.  Eating a healthy diet.  Avoiding tobacco and drug use.  Limiting alcohol use.  Practicing safe sex.  Taking low-dose aspirin every day.  Taking vitamin and mineral supplements as recommended by your health care provider. What happens during an annual well check? The services and screenings done by your health care provider during your annual well check will depend on your age, overall health,  lifestyle risk factors, and family history of disease. Counseling  Your health care provider may ask you questions about your:  Alcohol use.  Tobacco use.  Drug use.  Emotional well-being.  Home and relationship well-being.  Sexual activity.  Eating habits.  History of falls.  Memory and ability to understand (cognition).  Work and work Astronomer.  Reproductive health. Screening  You may have the following tests or measurements:  Height, weight, and BMI.  Blood pressure.  Lipid and cholesterol levels. These may be checked every 5 years, or more frequently if you are over 60 years old.  Skin check.  Lung cancer screening. You may have this screening every year starting at age 30 if you have a 30-pack-year history of smoking and currently smoke or have quit within the past 15 years.  Fecal occult blood test (FOBT) of the stool. You may have this test every year starting at age 54.  Flexible sigmoidoscopy or colonoscopy. You may have a sigmoidoscopy every 5 years or a colonoscopy every 10 years starting at age 55.  Hepatitis C blood test.  Hepatitis B blood test.  Sexually transmitted disease (STD) testing.  Diabetes screening. This is done by checking your blood sugar (glucose) after you have not eaten for a while (fasting). You may have this done every 1-3 years.  Bone density scan. This is done to screen for osteoporosis. You may have this done starting at age 76.  Mammogram. This may be done every 1-2 years. Talk to your health care provider about how often you should have regular mammograms. Talk with your health care provider about your test results, treatment  options, and if necessary, the need for more tests. Vaccines  Your health care provider may recommend certain vaccines, such as:  Influenza vaccine. This is recommended every year.  Tetanus, diphtheria, and acellular pertussis (Tdap, Td) vaccine. You may need a Td booster every 10 years.  Zoster  vaccine. You may need this after age 70.  Pneumococcal 13-valent conjugate (PCV13) vaccine. One dose is recommended after age 53.  Pneumococcal polysaccharide (PPSV23) vaccine. One dose is recommended after age 34. Talk to your health care provider about which screenings and vaccines you need and how often you need them. This information is not intended to replace advice given to you by your health care provider. Make sure you discuss any questions you have with your health care provider. Document Released: 12/26/2015 Document Revised: 08/18/2016 Document Reviewed: 09/30/2015 Elsevier Interactive Patient Education  2017 [employer] Prevention in the Home Falls can cause injuries. They can happen to people of all ages. There are many things you can do to make your home safe and to help prevent falls. What can I do on the outside of my home?  Regularly fix the edges of walkways and driveways and fix any cracks.  Remove anything that might make you trip as you walk through a door, such as a raised step or threshold.  Trim any bushes or trees on the path to your home.  Use bright outdoor lighting.  Clear any walking paths of anything that might make someone trip, such as rocks or tools.  Regularly check to see if handrails are loose or broken. Make sure that both sides of any steps have handrails.  Any raised decks and porches should have guardrails on the edges.  Have any leaves, snow, or ice cleared regularly.  Use sand or salt on walking paths during winter.  Clean up any spills in your garage right away. This includes oil or grease spills. What can I do in the bathroom?  Use night lights.  Install grab bars by the toilet and in the tub and shower. Do not use towel bars as grab bars.  Use non-skid mats or decals in the tub or shower.  If you need to sit down in the shower, use a plastic, non-slip stool.  Keep the floor dry. Clean up any water that spills on the  floor as soon as it happens.  Remove soap buildup in the tub or shower regularly.  Attach bath mats securely with double-sided non-slip rug tape.  Do not have throw rugs and other things on the floor that can make you trip. What can I do in the bedroom?  Use night lights.  Make sure that you have a light by your bed that is easy to reach.  Do not use any sheets or blankets that are too big for your bed. They should not hang down onto the floor.  Have a firm chair that has side arms. You can use this for support while you get dressed.  Do not have throw rugs and other things on the floor that can make you trip. What can I do in the kitchen?  Clean up any spills right away.  Avoid walking on wet floors.  Keep items that you use a lot in easy-to-reach places.  If you need to reach something above you, use a strong step stool that has a grab bar.  Keep electrical cords out of the way.  Do not use floor polish or wax that makes floors slippery.  If you must use wax, use non-skid floor wax.  Do not have throw rugs and other things on the floor that can make you trip. What can I do with my stairs?  Do not leave any items on the stairs.  Make sure that there are handrails on both sides of the stairs and use them. Fix handrails that are broken or loose. Make sure that handrails are as long as the stairways.  Check any carpeting to make sure that it is firmly attached to the stairs. Fix any carpet that is loose or worn.  Avoid having throw rugs at the top or bottom of the stairs. If you do have throw rugs, attach them to the floor with carpet tape.  Make sure that you have a light switch at the top of the stairs and the bottom of the stairs. If you do not have them, ask someone to add them for you. What else can I do to help prevent falls?  Wear shoes that:  Do not have high heels.  Have rubber bottoms.  Are comfortable and fit you well.  Are closed at the toe. Do not wear  sandals.  If you use a stepladder:  Make sure that it is fully opened. Do not climb a closed stepladder.  Make sure that both sides of the stepladder are locked into place.  Ask someone to hold it for you, if possible.  Clearly mark and make sure that you can see:  Any grab bars or handrails.  First and last steps.  Where the edge of each step is.  Use tools that help you move around (mobility aids) if they are needed. These include:  Canes.  Walkers.  Scooters.  Crutches.  Turn on the lights when you go into a dark area. Replace any light bulbs as soon as they burn out.  Set up your furniture so you have a clear path. Avoid moving your furniture around.  If any of your floors are uneven, fix them.  If there are any pets around you, be aware of where they are.  Review your medicines with your doctor. Some medicines can make you feel dizzy. This can increase your chance of falling. Ask your doctor what other things that you can do to help prevent falls. This information is not intended to replace advice given to you by your health care provider. Make sure you discuss any questions you have with your health care provider. Document Released: 09/25/2009 Document Revised: 05/06/2016 Document Reviewed: 01/03/2015 Elsevier Interactive Patient Education  2017 [employer].

## 2017-07-12 NOTE — Progress Notes (Signed)
Provider: Lauree Chandler, NP  Patient Care Team: Lauree Chandler, NP as PCP - General (Geriatric Medicine)  Extended Emergency Contact Information Primary Emergency Contact: Mcneice,Conrad Address: [address]          [place], [place] [address] Johnnette Litter of [place] Phone: [phone] Mobile Phone: [phone] Relation: Son Secondary Emergency Contact: Rollene Fare [place] of [place] Phone: [phone] Mobile Phone: [phone] Relation: Son No Known Allergies Code Status: FULL  Goals of Care: Advanced Directive information Advanced Directives 07/12/2017  Does Patient Have a Medical Advance Directive? No  Type of Advance Directive -  Would patient like information on creating a medical advance directive? -     Chief Complaint  Patient presents with  . Medical Management of Chronic Issues    Pt is being seen for an extended visit for management of chronic conditions. Pt needs DM foot exam and microablumin. Pt is scheduled for DM eye exam.     HPI: Patient is a 83 y.o. female seen in today for an annual wellness exam.    Depression screen [facility] 2/9 07/12/2017 06/02/2017  Decreased Interest 0 0  Down, Depressed, Hopeless 2 0  PHQ - 2 Score 2 0  Altered sleeping 0 -  Tired, decreased energy 3 -  Change in appetite 0 -  Feeling bad or failure about yourself  0 -  Trouble concentrating 0 -  Moving slowly or fidgety/restless 0 -  Suicidal thoughts 0 -  PHQ-9 Score 5 -  Difficult doing work/chores Somewhat difficult -    Fall Risk  07/12/2017 07/12/2017 06/02/2017 05/17/2017 04/21/2017  Falls in the past year? _0    MMSE - Mini Mental State Exam 07/12/2017 07/12/2017  Orientation to time 5 5  Orientation to Place 4 4  Registration 3 3  Attention/ Calculation 3 0  Recall 2 2  Language- name 2 objects 2 2  Language- repeat 1 1  Language- follow 3 step command 3 3  Language- read & follow direction 1 1  Write a sentence 1 1  Copy  design 0 0  Total score 25 22     Health Maintenance  Topic Date Due  . FOOT EXAM  06/27/1941  . OPHTHALMOLOGY EXAM  06/27/1941  . URINE MICROALBUMIN  06/27/1941  . DEXA SCAN  06/26/1998  . INFLUENZA VACCINE  07/13/2017  . HEMOGLOBIN A1C  12/10/2017  . TETANUS/TDAP  12/14/2025  . PNA vac Low Risk Adult  Completed    Past Medical History:  Diagnosis Date  . Anemia   . Anginal pain ([place])   . Arthritis    "legs, hands" (03/23/2017)  . CHF (congestive heart failure) ([place])   . CKD (chronic kidney disease), stage III   . COPD (chronic obstructive pulmonary disease) ([place])   . Depression   . GERD (gastroesophageal reflux disease)   . Heart murmur   . Hyperlipidemia   . Hypertension   . LBBB (left bundle branch block)   . NSTEMI (non-ST elevated myocardial infarction) ([place]) 1980s X 3; 12/2016   "light ones" (03/23/2017); Archie Endo 01/11/2017  . OSA on CPAP    "since 2013"  . Pneumonia 1980s X 1; 12/2016  . Type II diabetes mellitus ([place])     Past Surgical History:  Procedure Laterality Date  . ABDOMINAL HYSTERECTOMY  1969  . APPENDECTOMY  1940  . BACK SURGERY    . CARDIAC CATHETERIZATION N/A 01/06/2017   Procedure: Right/Left Heart Cath and Coronary Angiography;  Surgeon: Leonie Man, MD;  Location: [facility];  Service: Cardiovascular;  Laterality: N/A;  . CARPAL TUNNEL RELEASE Right   . CATARACT EXTRACTION W/ INTRAOCULAR LENS  IMPLANT, BILATERAL Bilateral   . COLONOSCOPY  2008   Negative  . DILATION AND CURETTAGE OF UTERUS  X 3  . LUMBAR LAMINECTOMY Right ?2000 - 2003 X 2   "bone spurs both times"  . TONSILLECTOMY      Social History   Social History  . Marital status: Widowed    Spouse name: N/A  . Number of children: N/A  . Years of education: N/A   Social History Main Topics  . Smoking status: Never Smoker  . Smokeless tobacco: Never Used  . Alcohol use No  . Drug use: No  . Sexual activity: No   Other Topics Concern  . None   Social History  Narrative   Social History      Diet? never      Do you drink/eat things with caffeine? coffee      Marital status?        widow                            What year were you married? 1955      Do you live in a house, apartment, assisted living, condo, trailer, etc.? house      Is it one or more stories? 2      How many persons live in your home? 3      Do you have any pets in your home? (please list) no      Highest level of education completed? 12 grade      Current or past profession: no      Advanced Directives      Do you exercise?      no                                Type & how often?      Do you have a living will? no       Do you have a DNR form?    no                              If not, do you want to discuss one?      Do you have signed POA/HPOA for forms?  no      Functional Status      Do you have difficulty bathing or dressing yourself? no      Do you have difficulty preparing food or eating? no      Do you have difficulty managing your medications? no      Do you have difficulty managing your finances? no      Do you have difficulty affording your medications?             Family History  Problem Relation Age of Onset  . Hypertension Father   . Stroke Father   . Kidney failure Mother   . Dementia Brother   . COPD Neg Hx   . Heart disease Neg Hx   . Diabetes Neg Hx     Review of Systems:  Review of Systems  Constitutional: Negative for activity change, appetite change, chills, diaphoresis, fatigue, fever and unexpected weight change.  HENT:       Edentulous  Respiratory: Negative for cough, chest tightness, shortness of breath and wheezing.   Cardiovascular: Negative for chest pain, palpitations and leg swelling.  Gastrointestinal: Positive for diarrhea. Negative for abdominal pain, constipation, nausea and vomiting.  Genitourinary: Negative for difficulty urinating, dysuria and frequency.  Musculoskeletal: Positive for arthralgias  (knees) and joint swelling.  Skin: Negative for color change, rash and wound.  Neurological: Positive for weakness (no falls). Negative for dizziness, tremors and headaches.  Psychiatric/Behavioral: Negative for agitation, behavioral problems and confusion. The patient is not nervous/anxious.      Allergies as of 07/12/2017   No Known Allergies     Medication List       Accurate as of 07/12/17  1:41 PM. Always use your most recent med list.          acetaminophen 500 MG tablet Commonly known as:  TYLENOL Take 500 mg by mouth every 6 (six) hours as needed.   aspirin 325 MG tablet Take 325 mg by mouth daily.   colchicine 0.6 MG tablet Every 2 hours until diarrhea (up to 5 tablets) then cut back to once daily   febuxostat 40 MG tablet Commonly known as:  ULORIC Take 1 tablet (40 mg total) by mouth daily.   ferrous sulfate 325 (65 FE) MG EC tablet Take 325 mg by mouth daily with breakfast.   Fluticasone-Salmeterol 500-50 MCG/DOSE Aepb Commonly known as:  ADVAIR DISKUS Inhale 1 puff into the lungs 2 (two) times daily.   hydrALAZINE 25 MG tablet Commonly known as:  APRESOLINE TAKE 3 TABLETS (75 MG TOTAL) BY MOUTH EVERY 8 (EIGHT) HOURS.   isosorbide mononitrate 30 MG 24 hr tablet Commonly known as:  IMDUR Take 1 tablet (30 mg total) by mouth daily.   KLOR-CON M20 20 MEQ tablet Generic drug:  potassium chloride SA TAKE 1 TABLET (20 MEQ TOTAL) BY MOUTH DAILY.   pravastatin 40 MG tablet Commonly known as:  PRAVACHOL Take 1 tablet (40 mg total) by mouth daily.   sertraline 25 MG tablet Commonly known as:  ZOLOFT TAKE 1 TABLET BY MOUTH EVERY DAY   torsemide 20 MG tablet Commonly known as:  DEMADEX Take 1 tablet (20 mg total) by mouth daily.   umeclidinium bromide 62.5 MCG/INH Aepb Commonly known as:  INCRUSE ELLIPTA Inhale 1 puff into the lungs daily.   vitamin B-12 1000 MCG tablet Commonly known as:  CYANOCOBALAMIN Take 1,000 mcg by mouth daily.   Vitamin D3  1000 units Caps Take 1,000 Units by mouth daily.         Physical Exam: Vitals:   07/12/17 1313  BP: 125/60  Pulse: 66  Resp: 17  Temp: 98 F (36.7 C)  TempSrc: Oral  SpO2: 96%  Weight: 171 lb (77.6 kg)  Height: _0  (1.702 m)   Body mass index is 26.78 kg/m. Physical Exam  Constitutional: She is oriented to person, place, and time. She appears well-developed and well-nourished. No distress.  HENT:  Head: Normocephalic and atraumatic.  Right Ear: External ear normal.  Left Ear: External ear normal.  Nose: Nose normal.  Mouth/Throat: Oropharynx is clear and moist. No oropharyngeal exudate.  Eyes: Pupils are equal, round, and reactive to light. Conjunctivae and EOM are normal.  Neck: Normal range of motion. Neck supple.  Cardiovascular: Normal rate, regular rhythm, normal heart sounds and intact distal pulses.   Pulmonary/Chest: Effort normal and breath sounds normal. No respiratory distress. She exhibits no mass. Right  breast exhibits no inverted nipple, no mass and no tenderness. Left breast exhibits no inverted nipple, no mass and no tenderness.  Abdominal: Soft. Bowel sounds are normal.  Musculoskeletal: Normal range of motion. She exhibits no edema or tenderness.  Neurological: She is alert and oriented to person, place, and time. She has normal reflexes. No cranial nerve deficit. Coordination normal.  Diminished sensation bilaterally to ankles.   Skin: Skin is warm and dry.  Psychiatric: She has a normal mood and affect.    Labs reviewed: Basic Metabolic Panel:  Recent Labs  01/01/17 1609 01/02/17 0226  03/23/17 1347 03/24/17 0305 03/25/17 0324 03/29/17 1539 06/10/17 1042  NA  --  142  < >  --  140 140 140 143  K  --  2.9*  < >  --  4.0 3.1* 3.9 3.8  CL  --  104  < >  --  105 102 101 106  CO2  --  27  < >  --  _0 GLUCOSE  --  152*  < >  --  119* 111* 87 94  BUN  --  30*  < >  --  41* 36* 32* 43*  CREATININE  --  1.39*  < >  --  1.56* 1.46*  1.43* 1.72*  CALCIUM  --  8.9  < >  --  9.0 8.9 9.9 9.1  MG  --  2.1  --   --  1.3*  --   --   --   PHOS 4.5  --   --   --   --   --   --   --   TSH  --   --   --  0.669  --   --   --  1.59  < > = values in this interval not displayed. Liver Function Tests:  Recent Labs  01/01/17 1046 02/07/17 1033 06/10/17 1042  AST 44* 19 69*  ALT 57* 19 66*  ALKPHOS 82 73 88  BILITOT 1.1 0.5 0.4  PROT 8.2* 7.0 6.4  ALBUMIN 3.9 3.9 3.8   No results for input(s): LIPASE, AMYLASE in the last 8760 hours. No results for input(s): AMMONIA in the last 8760 hours. CBC:  Recent Labs  01/01/17 1046  02/04/17 1120 03/22/17 1453 03/23/17 0305 06/10/17 1042  WBC 20.8*  < > 10.7 8.6 8.2 3.4*  NEUTROABS 9.5*  --  6,955  --   --  1,666  HGB 10.4*  < > 11.7 10.5* 10.3* 11.2*  HCT 33.6*  < > 36.8 33.4* 32.5* 34.8*  MCV 94.1  < > 93.6 93.3 93.7 90.6  PLT 232  < > 267 192 155 146  < > = values in this interval not displayed. Lipid Panel:  Recent Labs  02/07/17 1033 06/13/17 0932  CHOL 195 144  HDL 66 38*  LDLCALC 106* 86  TRIG 113 102  CHOLHDL 3.0 3.8   Lab Results  Component Value Date   HGBA1C 6.3 (H) 06/10/2017   Procedures: No results found.  Assessment/Plan 1. Wellness examination The patient was counseled regarding regular self-examination of the breasts on a monthly basis, regular sustained exercise for at least 30 minutes 5 times per week, and recommended schedule for GI hemoccult testing, colonoscopy, cholesterol, thyroid and diabetes screening. -pt plans to follow up with ophthalmology -MMSE 25/30, also with depression (see #5).     2. Idiopathic gout, unspecified chronicity, unspecified site conts to have diarrhea. Without pain  to feet or ankles at this time. Will stop colchicine at this time. conts on uloric  - Uric acid; Future  3.  Controlled type 2 diabetes mellitus with stage 3 chronic kidney disease, without long-term current use of insulin ([place]) -diet  controlled -has ophthalmologist appt scheduled - Microalbumin, urine - CBC with Differential/Platelets; Future  4. Estrogen deficiency - DG Bone Density; Future  5. Depression -worse, initially improved on zoloft 25 mg but now having increase in depression. Will increase to 50 mg daily  - sertraline (ZOLOFT) 50 MG tablet; Take 1 tablet (50 mg total) by mouth daily.  Dispense: 90 tablet; Refill: 1  6. Chronic kidney disease, stage 3 - CMP with eGFR; Future - CBC with Differential/Platelets; Future  Follow up in 3 months, lab work before.  Carlos American. Harle Battiest  [facility] [phone] 8 am - 5 pm) [phone] (after hours)

## 2017-07-12 NOTE — Progress Notes (Signed)
Subjective:   Kelly [name] is a 83 y.o. female who presents for an Initial Medicare Annual Wellness Visit.         Objective:    Today's Vitals   07/12/17 1238  BP: 125/60  Pulse: 66  Temp: 98 F (36.7 C)  TempSrc: Oral  SpO2: 96%  Weight: 171 lb (77.6 kg)  Height: 5\' 7"  (1.702 m)   Body mass index is 26.78 kg/m.   Current Medications (verified) Outpatient Encounter Prescriptions as of 07/12/2017  Medication Sig  . acetaminophen (TYLENOL) 500 MG tablet Take 500 mg by mouth every 6 (six) hours as needed.  Marland Kitchen aspirin 325 MG tablet Take 325 mg by mouth daily.  . Cholecalciferol (VITAMIN D3) 1000 units CAPS Take 1,000 Units by mouth daily.  . colchicine 0.6 MG tablet Every 2 hours until diarrhea (up to 5 tablets) then cut back to once daily  . febuxostat (ULORIC) 40 MG tablet Take 1 tablet (40 mg total) by mouth daily.  . ferrous sulfate 325 (65 FE) MG EC tablet Take 325 mg by mouth daily with breakfast.   . Fluticasone-Salmeterol (ADVAIR DISKUS) 500-50 MCG/DOSE AEPB Inhale 1 puff into the lungs 2 (two) times daily.  . hydrALAZINE (APRESOLINE) 25 MG tablet TAKE 3 TABLETS (75 MG TOTAL) BY MOUTH EVERY 8 (EIGHT) HOURS.  . isosorbide mononitrate (IMDUR) 30 MG 24 hr tablet Take 1 tablet (30 mg total) by mouth daily.  Marland Kitchen KLOR-CON M20 20 MEQ tablet TAKE 1 TABLET (20 MEQ TOTAL) BY MOUTH DAILY.  . pravastatin (PRAVACHOL) 40 MG tablet Take 1 tablet (40 mg total) by mouth daily.  . sertraline (ZOLOFT) 25 MG tablet TAKE 1 TABLET BY MOUTH EVERY DAY  . torsemide (DEMADEX) 20 MG tablet Take 1 tablet (20 mg total) by mouth daily.  Marland Kitchen umeclidinium bromide (INCRUSE ELLIPTA) 62.5 MCG/INH AEPB Inhale 1 puff into the lungs daily.  . vitamin B-12 (CYANOCOBALAMIN) 1000 MCG tablet Take 1,000 mcg by mouth daily.  . [DISCONTINUED] isosorbide mononitrate (IMDUR) 60 MG 24 hr tablet TAKE 1 TABLET BY MOUTH EVERY DAY  . [DISCONTINUED] torsemide (DEMADEX) 10 MG tablet TAKE 1 TABLET (10 MG TOTAL) BY MOUTH  DAILY.   No facility-administered encounter medications on file as of 07/12/2017.     Allergies (verified) Patient has no known allergies.   History: Past Medical History:  Diagnosis Date  . Anemia   . Anginal pain (HCC)   . Arthritis    "legs, hands" (03/23/2017)  . CHF (congestive heart failure) (HCC)   . CKD (chronic kidney disease), stage III   . COPD (chronic obstructive pulmonary disease) (HCC)   . Depression   . GERD (gastroesophageal reflux disease)   . Heart murmur   . Hyperlipidemia   . Hypertension   . LBBB (left bundle branch block)   . NSTEMI (non-ST elevated myocardial infarction) (HCC) 1980s X 3; 12/2016   "light ones" (03/23/2017); [name] 01/11/2017  . OSA on CPAP    "since 2013"  . Pneumonia 1980s X 1; 12/2016  . Type II diabetes mellitus (HCC)    Past Surgical History:  Procedure Laterality Date  . ABDOMINAL HYSTERECTOMY  1969  . APPENDECTOMY  1940  . BACK SURGERY    . CARDIAC CATHETERIZATION N/A 01/06/2017   Procedure: Right/Left Heart Cath and Coronary Angiography;  Surgeon: Marykay Lex, MD;  Location: [facility] INVASIVE CV LAB;  Service: Cardiovascular;  Laterality: N/A;  . CARPAL TUNNEL RELEASE Right   . CATARACT EXTRACTION W/ INTRAOCULAR LENS  IMPLANT, BILATERAL Bilateral   . COLONOSCOPY  2008   Negative  . DILATION AND CURETTAGE OF UTERUS  X 3  . LUMBAR LAMINECTOMY Right ?2000 - 2003 X 2   "bone spurs both times"  . TONSILLECTOMY     Family History  Problem Relation Age of Onset  . Hypertension Father   . Stroke Father   . Kidney failure Mother   . Dementia Brother   . COPD Neg Hx   . Heart disease Neg Hx   . Diabetes Neg Hx    Social History   Occupational History  . Not on file.   Social History Main Topics  . Smoking status: Never Smoker  . Smokeless tobacco: Never Used  . Alcohol use No  . Drug use: No  . Sexual activity: No    Tobacco Counseling Counseling given: Not Answered   Activities of Daily Living In your present  state of health, do you have any difficulty performing the following activities: 07/12/2017 03/23/2017  Hearing? Y N  Vision? Y N  Comment Patient has eye exam July 19, 2017.  -  Difficulty concentrating or making decisions? N N  Walking or climbing stairs? Y N  Comment weakness -  Dressing or bathing? N N  Doing errands, shopping? N Y  Comment - "I don't drive"  Preparing Food and eating ? N -  Using the Toilet? N -  In the past six months, have you accidently leaked urine? N -  Do you have problems with loss of bowel control? Y -  Managing your Medications? N -  Managing your Finances? N -  Housekeeping or managing your Housekeeping? N -  Some recent data might be hidden    Immunizations and Health Maintenance Immunization History  Administered Date(s) Administered  . Influenza-Unspecified 09/13/2016  . PPD Test 01/08/2017, 01/22/2017  . Pneumococcal Conjugate-13 07/12/2017  . Pneumococcal-Unspecified 09/13/2016  . Tdap 12/15/2015  . Zoster 12/15/2015   Health Maintenance Due  Topic Date Due  . FOOT EXAM  06/27/1941  . OPHTHALMOLOGY EXAM  06/27/1941  . URINE MICROALBUMIN  06/27/1941  . DEXA SCAN  06/26/1998    Patient Care Team: Sharon Seller, NP as PCP - General (Geriatric Medicine)  Indicate any recent Medical Services you may have received from other than [facility] providers in the past year (date may be approximate).     Assessment:   This is a routine wellness examination for [place].   Hearing/Vision screen No exam data present  Dietary issues and exercise activities discussed: Current Exercise Habits: The patient does not participate in regular exercise at present, Exercise limited by: None identified  Goals    . Exercise 3x per week (30 min per time)          Patient will try walking with her son outside.       Depression Screen PHQ 2/9 Scores 07/12/2017 06/02/2017  PHQ - 2 Score 2 0  PHQ- 9 Score 5 -    Fall Risk Fall Risk  07/12/2017  06/02/2017 05/17/2017 04/21/2017 04/19/2017  Falls in the past year? No No No No No    Cognitive Function: MMSE - Mini Mental State Exam 07/12/2017  Orientation to time 5  Orientation to Place 4  Registration 3  Attention/ Calculation 0  Recall 2  Language- name 2 objects 2  Language- repeat 1  Language- follow 3 step command 3  Language- read & follow direction 1  Write a sentence 1  Copy design  0  Total score 22        Screening Tests Health Maintenance  Topic Date Due  . FOOT EXAM  06/27/1941  . OPHTHALMOLOGY EXAM  06/27/1941  . URINE MICROALBUMIN  [birth year]27/1941  . DEXA SCAN  06/26/1998  . INFLUENZA VACCINE  07/13/2017  . HEMOGLOBIN A1C  12/10/2017  . TETANUS/TDAP  12/14/2025  . PNA vac Low Risk Adult  Completed      Plan:    I have personally reviewed and addressed the Medicare Annual Wellness questionnaire and have noted the following in the patient's chart:  A. Medical and social history B. Use of alcohol, tobacco or illicit drugs  C. Current medications and supplements D. Functional ability and status E.  Nutritional status F.  Physical activity G. Advance directives H. List of other physicians I.  Hospitalizations, surgeries, and ER visits in previous 12 months J.  Vitals K. Screenings to include hearing, vision, cognitive, depression L. Referrals and appointments - none  In addition, I have reviewed and discussed with patient certain preventive protocols, quality metrics, and best practice recommendations. A written personalized care plan for preventive services as well as general preventive health recommendations were provided to patient.  See attached scanned questionnaire for additional information.   Signed,   Annetta Maw, RN Nurse Health Advisor  Quick Notes   Health Maintenance: Patient has eye exam scheduled in August. Prevnar 13 given. Microalbumin, foot exam due. DEXA records requested.     Abnormal Screen: PHQ9- 5 . MMSE- 22 out of 30.  Passed clock drawing      Patient Concerns: Pain in right wrist, onset 1 month.     Nurse Concerns: none

## 2017-07-12 NOTE — Patient Instructions (Addendum)
Stop colchicine  Increase sertraline to 50 mg daily for depression   Health Maintenance, Female Adopting a healthy lifestyle and getting preventive care can go a long way to promote health and wellness. Talk with your health care provider about what schedule of regular examinations is right for you. This is a good chance for you to check in with your provider about disease prevention and staying healthy. In between checkups, there are plenty of things you can do on your own. Experts have done a lot of research about which lifestyle changes and preventive measures are most likely to keep you healthy. Ask your health care provider for more information. Weight and diet Eat a healthy diet  Be sure to include plenty of vegetables, fruits, low-fat dairy products, and lean protein.  Do not eat a lot of foods high in solid fats, added sugars, or salt.  Get regular exercise. This is one of the most important things you can do for your health. ? Most adults should exercise for at least 150 minutes each week. The exercise should increase your heart rate and make you sweat (moderate-intensity exercise). ? Most adults should also do strengthening exercises at least twice a week. This is in addition to the moderate-intensity exercise.  Maintain a healthy weight  Body mass index (BMI) is a measurement that can be used to identify possible weight problems. It estimates body fat based on height and weight. Your health care provider can help determine your BMI and help you achieve or maintain a healthy weight.  For females 70 years of age and older: ? A BMI below 18.5 is considered underweight. ? A BMI of 18.5 to 24.9 is normal. ? A BMI of 25 to 29.9 is considered overweight. ? A BMI of 30 and above is considered obese.  Watch levels of cholesterol and blood lipids  You should start having your blood tested for lipids and cholesterol at 81 years of age, then have this test every 5 years.  You may need  to have your cholesterol levels checked more often if: ? Your lipid or cholesterol levels are high. ? You are older than 81 years of age. ? You are at high risk for heart disease.  Cancer screening Lung Cancer  Lung cancer screening is recommended for adults 11-56 years old who are at high risk for lung cancer because of a history of smoking.  A yearly low-dose CT scan of the lungs is recommended for people who: ? Currently smoke. ? Have quit within the past 15 years. ? Have at least a 30-pack-year history of smoking. A pack year is smoking an average of one pack of cigarettes a day for 1 year.  Yearly screening should continue until it has been 15 years since you quit.  Yearly screening should stop if you develop a health problem that would prevent you from having lung cancer treatment.  Breast Cancer  Practice breast self-awareness. This means understanding how your breasts normally appear and feel.  It also means doing regular breast self-exams. Let your health care provider know about any changes, no matter how small.  If you are in your 20s or 30s, you should have a clinical breast exam (CBE) by a health care provider every 1-3 years as part of a regular health exam.  If you are 48 or older, have a CBE every year. Also consider having a breast X-ray (mammogram) every year.  If you have a family history of breast cancer, talk to your  health care provider about genetic screening.  If you are at high risk for breast cancer, talk to your health care provider about having an MRI and a mammogram every year.  Breast cancer gene (BRCA) assessment is recommended for women who have family members with BRCA-related cancers. BRCA-related cancers include: ? Breast. ? Ovarian. ? Tubal. ? Peritoneal cancers.  Results of the assessment will determine the need for genetic counseling and BRCA1 and BRCA2 testing.  Cervical Cancer Your health care provider may recommend that you be  screened regularly for cancer of the pelvic organs (ovaries, uterus, and vagina). This screening involves a pelvic examination, including checking for microscopic changes to the surface of your cervix (Pap test). You may be encouraged to have this screening done every 3 years, beginning at age 37.  For women ages 71-65, health care providers may recommend pelvic exams and Pap testing every 3 years, or they may recommend the Pap and pelvic exam, combined with testing for human papilloma virus (HPV), every 5 years. Some types of HPV increase your risk of cervical cancer. Testing for HPV may also be done on women of any age with unclear Pap test results.  Other health care providers may not recommend any screening for nonpregnant women who are considered low risk for pelvic cancer and who do not have symptoms. Ask your health care provider if a screening pelvic exam is right for you.  If you have had past treatment for cervical cancer or a condition that could lead to cancer, you need Pap tests and screening for cancer for at least 20 years after your treatment. If Pap tests have been discontinued, your risk factors (such as having a new sexual partner) need to be reassessed to determine if screening should resume. Some women have medical problems that increase the chance of getting cervical cancer. In these cases, your health care provider may recommend more frequent screening and Pap tests.  Colorectal Cancer  This type of cancer can be detected and often prevented.  Routine colorectal cancer screening usually begins at 81 years of age and continues through 81 years of age.  Your health care provider may recommend screening at an earlier age if you have risk factors for colon cancer.  Your health care provider may also recommend using home test kits to check for hidden blood in the stool.  A small camera at the end of a tube can be used to examine your colon directly (sigmoidoscopy or colonoscopy).  This is done to check for the earliest forms of colorectal cancer.  Routine screening usually begins at age 86.  Direct examination of the colon should be repeated every 5-10 years through 81 years of age. However, you may need to be screened more often if early forms of precancerous polyps or small growths are found.  Skin Cancer  Check your skin from head to toe regularly.  Tell your health care provider about any new moles or changes in moles, especially if there is a change in a mole's shape or color.  Also tell your health care provider if you have a mole that is larger than the size of a pencil eraser.  Always use sunscreen. Apply sunscreen liberally and repeatedly throughout the day.  Protect yourself by wearing long sleeves, pants, a wide-brimmed hat, and sunglasses whenever you are outside.  Heart disease, diabetes, and high blood pressure  High blood pressure causes heart disease and increases the risk of stroke. High blood pressure is more likely to  develop in: ? People who have blood pressure in the high end of the normal range (130-139/85-89 mm Hg). ? People who are overweight or obese. ? People who are African American.  If you are 77-63 years of age, have your blood pressure checked every 3-5 years. If you are 73 years of age or older, have your blood pressure checked every year. You should have your blood pressure measured twice-once when you are at a hospital or clinic, and once when you are not at a hospital or clinic. Record the average of the two measurements. To check your blood pressure when you are not at a hospital or clinic, you can use: ? An automated blood pressure machine at a pharmacy. ? A home blood pressure monitor.  If you are between 49 years and 15 years old, ask your health care provider if you should take aspirin to prevent strokes.  Have regular diabetes screenings. This involves taking a blood sample to check your fasting blood sugar level. ? If  you are at a normal weight and have a low risk for diabetes, have this test once every three years after 81 years of age. ? If you are overweight and have a high risk for diabetes, consider being tested at a younger age or more often. Preventing infection Hepatitis B  If you have a higher risk for hepatitis B, you should be screened for this virus. You are considered at high risk for hepatitis B if: ? You were born in a country where hepatitis B is common. Ask your health care provider which countries are considered high risk. ? Your parents were born in a high-risk country, and you have not been immunized against hepatitis B (hepatitis B vaccine). ? You have HIV or AIDS. ? You use needles to inject street drugs. ? You live with someone who has hepatitis B. ? You have had sex with someone who has hepatitis B. ? You get hemodialysis treatment. ? You take certain medicines for conditions, including cancer, organ transplantation, and autoimmune conditions.  Hepatitis C  Blood testing is recommended for: ? Everyone born from [birth year] through 1963. ? Anyone with known risk factors for hepatitis C.  Sexually transmitted infections (STIs)  You should be screened for sexually transmitted infections (STIs) including gonorrhea and chlamydia if: ? You are sexually active and are younger than 81 years of age. ? You are older than 81 years of age and your health care provider tells you that you are at risk for this type of infection. ? Your sexual activity has changed since you were last screened and you are at an increased risk for chlamydia or gonorrhea. Ask your health care provider if you are at risk.  If you do not have HIV, but are at risk, it may be recommended that you take a prescription medicine daily to prevent HIV infection. This is called pre-exposure prophylaxis (PrEP). You are considered at risk if: ? You are sexually active and do not regularly use condoms or know the HIV status of your  partner(s). ? You take drugs by injection. ? You are sexually active with a partner who has HIV.  Talk with your health care provider about whether you are at high risk of being infected with HIV. If you choose to begin PrEP, you should first be tested for HIV. You should then be tested every 3 months for as long as you are taking PrEP. Pregnancy  If you are premenopausal and you may become pregnant,  ask your health care provider about preconception counseling.  If you may become pregnant, take 400 to 800 micrograms (mcg) of folic acid every day.  If you want to prevent pregnancy, talk to your health care provider about birth control (contraception). Osteoporosis and menopause  Osteoporosis is a disease in which the bones lose minerals and strength with aging. This can result in serious bone fractures. Your risk for osteoporosis can be identified using a bone density scan.  If you are 42 years of age or older, or if you are at risk for osteoporosis and fractures, ask your health care provider if you should be screened.  Ask your health care provider whether you should take a calcium or vitamin D supplement to lower your risk for osteoporosis.  Menopause may have certain physical symptoms and risks.  Hormone replacement therapy may reduce some of these symptoms and risks. Talk to your health care provider about whether hormone replacement therapy is right for you. Follow these instructions at home:  Schedule regular health, dental, and eye exams.  Stay current with your immunizations.  Do not use any tobacco products including cigarettes, chewing tobacco, or electronic cigarettes.  If you are pregnant, do not drink alcohol.  If you are breastfeeding, limit how much and how often you drink alcohol.  Limit alcohol intake to no more than 1 drink per day for nonpregnant women. One drink equals 12 ounces of beer, 5 ounces of wine, or 1 ounces of hard liquor.  Do not use street  drugs.  Do not share needles.  Ask your health care provider for help if you need support or information about quitting drugs.  Tell your health care provider if you often feel depressed.  Tell your health care provider if you have ever been abused or do not feel safe at home. This information is not intended to replace advice given to you by your health care provider. Make sure you discuss any questions you have with your health care provider. Document Released: 06/14/2011 Document Revised: 05/06/2016 Document Reviewed: 09/02/2015 Elsevier Interactive Patient Education  [employer].

## 2017-07-13 LAB — MICROALBUMIN, URINE: MICROALB UR: 0.4 mg/dL

## 2017-07-21 DIAGNOSIS — H401121 Primary open-angle glaucoma, left eye, mild stage: Secondary | ICD-10-CM | POA: Diagnosis not present

## 2017-07-21 DIAGNOSIS — E119 Type 2 diabetes mellitus without complications: Secondary | ICD-10-CM | POA: Diagnosis not present

## 2017-07-21 DIAGNOSIS — H401112 Primary open-angle glaucoma, right eye, moderate stage: Secondary | ICD-10-CM | POA: Diagnosis not present

## 2017-07-21 DIAGNOSIS — H35033 Hypertensive retinopathy, bilateral: Secondary | ICD-10-CM | POA: Diagnosis not present

## 2017-07-21 LAB — HM DIABETES EYE EXAM

## 2017-08-01 ENCOUNTER — Other Ambulatory Visit: Payer: Self-pay | Admitting: Nurse Practitioner

## 2017-08-21 ENCOUNTER — Other Ambulatory Visit: Payer: Self-pay | Admitting: Nurse Practitioner

## 2017-08-21 DIAGNOSIS — M10272 Drug-induced gout, left ankle and foot: Secondary | ICD-10-CM

## 2017-08-31 ENCOUNTER — Telehealth: Payer: Self-pay | Admitting: Nurse Practitioner

## 2017-09-09 ENCOUNTER — Other Ambulatory Visit: Payer: Self-pay

## 2017-09-09 DIAGNOSIS — I110 Hypertensive heart disease with heart failure: Secondary | ICD-10-CM

## 2017-09-09 DIAGNOSIS — I5042 Chronic combined systolic (congestive) and diastolic (congestive) heart failure: Principal | ICD-10-CM

## 2017-09-09 MED ORDER — HYDRALAZINE HCL 25 MG PO TABS: 75.0000 mg | ORAL_TABLET | Freq: Three times a day (TID) | ORAL | 1 refills | Status: DC

## 2017-09-09 NOTE — Telephone Encounter (Signed)
A prescription request was received from [facility] for hydralazine 25 mg. Rx was sent to pharmacy electronically.

## 2017-09-15 ENCOUNTER — Other Ambulatory Visit: Payer: Self-pay | Admitting: Nurse Practitioner

## 2017-09-15 DIAGNOSIS — I110 Hypertensive heart disease with heart failure: Secondary | ICD-10-CM

## 2017-09-18 ENCOUNTER — Other Ambulatory Visit: Payer: Self-pay | Admitting: Nurse Practitioner

## 2017-09-18 DIAGNOSIS — I110 Hypertensive heart disease with heart failure: Secondary | ICD-10-CM

## 2017-10-01 ENCOUNTER — Other Ambulatory Visit: Payer: Self-pay | Admitting: Nurse Practitioner

## 2017-10-01 DIAGNOSIS — I25118 Atherosclerotic heart disease of native coronary artery with other forms of angina pectoris: Secondary | ICD-10-CM

## 2017-10-04 DIAGNOSIS — M2141 Flat foot [pes planus] (acquired), right foot: Secondary | ICD-10-CM | POA: Diagnosis not present

## 2017-10-04 DIAGNOSIS — M2011 Hallux valgus (acquired), right foot: Secondary | ICD-10-CM | POA: Diagnosis not present

## 2017-10-04 DIAGNOSIS — E1140 Type 2 diabetes mellitus with diabetic neuropathy, unspecified: Secondary | ICD-10-CM | POA: Diagnosis not present

## 2017-10-04 DIAGNOSIS — M2142 Flat foot [pes planus] (acquired), left foot: Secondary | ICD-10-CM | POA: Diagnosis not present

## 2017-10-04 DIAGNOSIS — Z23 Encounter for immunization: Secondary | ICD-10-CM | POA: Diagnosis not present

## 2017-10-04 DIAGNOSIS — L84 Corns and callosities: Secondary | ICD-10-CM | POA: Diagnosis not present

## 2017-10-10 ENCOUNTER — Other Ambulatory Visit: Payer: Medicare Other

## 2017-10-10 DIAGNOSIS — E1122 Type 2 diabetes mellitus with diabetic chronic kidney disease: Secondary | ICD-10-CM | POA: Diagnosis not present

## 2017-10-10 DIAGNOSIS — M1000 Idiopathic gout, unspecified site: Secondary | ICD-10-CM | POA: Diagnosis not present

## 2017-10-10 DIAGNOSIS — N183 Chronic kidney disease, stage 3 unspecified: Secondary | ICD-10-CM

## 2017-10-12 ENCOUNTER — Ambulatory Visit (INDEPENDENT_AMBULATORY_CARE_PROVIDER_SITE_OTHER): Payer: Medicare Other | Admitting: Nurse Practitioner

## 2017-10-12 ENCOUNTER — Telehealth: Payer: Self-pay

## 2017-10-12 ENCOUNTER — Encounter: Payer: Self-pay | Admitting: Nurse Practitioner

## 2017-10-12 VITALS — BP 144/84 | HR 69 | Temp 98.4°F | Resp 17 | Ht 67.0 in | Wt 170.2 lb

## 2017-10-12 DIAGNOSIS — D509 Iron deficiency anemia, unspecified: Secondary | ICD-10-CM | POA: Diagnosis not present

## 2017-10-12 DIAGNOSIS — N183 Chronic kidney disease, stage 3 unspecified: Secondary | ICD-10-CM

## 2017-10-12 DIAGNOSIS — M25561 Pain in right knee: Secondary | ICD-10-CM | POA: Diagnosis not present

## 2017-10-12 DIAGNOSIS — I5042 Chronic combined systolic (congestive) and diastolic (congestive) heart failure: Secondary | ICD-10-CM | POA: Diagnosis not present

## 2017-10-12 DIAGNOSIS — E785 Hyperlipidemia, unspecified: Secondary | ICD-10-CM

## 2017-10-12 DIAGNOSIS — G4733 Obstructive sleep apnea (adult) (pediatric): Secondary | ICD-10-CM

## 2017-10-12 DIAGNOSIS — I25110 Atherosclerotic heart disease of native coronary artery with unstable angina pectoris: Secondary | ICD-10-CM

## 2017-10-12 DIAGNOSIS — J449 Chronic obstructive pulmonary disease, unspecified: Secondary | ICD-10-CM | POA: Diagnosis not present

## 2017-10-12 DIAGNOSIS — M25562 Pain in left knee: Secondary | ICD-10-CM

## 2017-10-12 DIAGNOSIS — E2839 Other primary ovarian failure: Secondary | ICD-10-CM

## 2017-10-12 DIAGNOSIS — E1122 Type 2 diabetes mellitus with diabetic chronic kidney disease: Secondary | ICD-10-CM | POA: Diagnosis not present

## 2017-10-12 DIAGNOSIS — F339 Major depressive disorder, recurrent, unspecified: Secondary | ICD-10-CM | POA: Diagnosis not present

## 2017-10-12 DIAGNOSIS — G8929 Other chronic pain: Secondary | ICD-10-CM

## 2017-10-12 DIAGNOSIS — M1000 Idiopathic gout, unspecified site: Secondary | ICD-10-CM

## 2017-10-12 DIAGNOSIS — I200 Unstable angina: Secondary | ICD-10-CM

## 2017-10-12 DIAGNOSIS — Z9989 Dependence on other enabling machines and devices: Secondary | ICD-10-CM

## 2017-10-12 MED ORDER — PRAVASTATIN SODIUM 40 MG PO TABS: 40.0000 mg | ORAL_TABLET | Freq: Every day | ORAL | 1 refills | Status: DC

## 2017-10-12 NOTE — Patient Instructions (Signed)
Get knee xrays prior to appt with Dr Renato Gails for knee injection

## 2017-10-12 NOTE — Telephone Encounter (Signed)
I called [facility] to verify if patient was given a flu vaccine on 10/03/17. I left a message asking that the nurse call me at the office.

## 2017-10-12 NOTE — Progress Notes (Signed)
Careteam: Patient Care Team: Lauree Chandler, NP as PCP - General (Geriatric Medicine)  Advanced Directive information Does Patient Have a Medical Advance Directive?: No, Would patient like information on creating a medical advance directive?: No - Patient declined  No Known Allergies  Chief Complaint  Patient presents with  . Medical Management of Chronic Issues    Pt is being seen for a 3 month routine visit. Pt wants a referral for pulmonology for sleep study.   . quality measures    Pt would like to have a bone density scan     HPI: Patient is a 83 y.o. female seen in the office today for routine follow up.  Still looking for assistive living in [place], living with son currently.  Would like to have bone density states it has been over 3 years since her last one. Gout- better, occasionally will get pain in her toe but no pain in her ankles. Taking uloric daily. Uric acid at goal 5.3  COPD- stable, incruse ellipta and advair daily, no increase in shortness of breath, wheezing, cough or congestion OSA- need cpap supplies, would like referral to pulmonary at this time.   CAD-no chest pains, currently on isosorbide mononitrate and ASA 325 mg daily   CHF- following with cardiology every 6 months, no issues, no swelling, no shortness of breath. Taking torsemide, hydralazine and nitrates. No beta blocker due to bradycardia and no ACEi/ARB due to renal function.   Anemia- conts on iron supplement. Labs reviewed with pt.   Diabetes- diet controlled  OA- bilateral knee pain. biofreeze and tylenol does not help. Has had knee injection in the past- has been over a year ago since her last injection.   Review of Systems:  Review of Systems  Constitutional: Negative for chills and fever.  HENT: Negative for congestion.   Eyes: Negative for blurred vision.       Glasses  Respiratory: Negative for shortness of breath.   Cardiovascular: Negative for chest pain, palpitations  and leg swelling.  Gastrointestinal: Negative for abdominal pain.  Genitourinary: Negative for dysuria.  Musculoskeletal: Positive for joint pain (bilateral knee pain). Negative for falls.  Skin: Negative for itching and rash.  Neurological: Negative for dizziness.  Psychiatric/Behavioral: Negative for depression.    Past Medical History:  Diagnosis Date  . Anemia   . Anginal pain ([place])   . Aortic stenosis   . Arthritis    "legs, hands" (03/23/2017)  . Asthma   . CHF (congestive heart failure) ([place])   . CKD (chronic kidney disease), stage III ([place])   . COPD (chronic obstructive pulmonary disease) ([place])   . Depression   . Esophageal disorder   . Fibromyalgia   . GERD (gastroesophageal reflux disease)   . Glaucoma 2009  . Heart murmur   . Hyperlipidemia   . Hypertension   . LBBB (left bundle branch block)   . Lumbar disc disease   . Menorrhagia   . NSTEMI (non-ST elevated myocardial infarction) ([place]) 1980s X 3; 12/2016   "light ones" (03/23/2017); Archie Endo 01/11/2017  . OSA on CPAP    "since 2013"  . Pneumonia 1980s X 1; 12/2016  . Primary Parkinsonism ([place])   . Renal insufficiency   . Tonsillitis   . Type II diabetes mellitus ([place])    Past Surgical History:  Procedure Laterality Date  . ABDOMINAL HYSTERECTOMY  1969  . APPENDECTOMY  1940  . BACK SURGERY    . CARDIAC CATHETERIZATION N/A 01/06/2017  Procedure: Right/Left Heart Cath and Coronary Angiography;  Surgeon: Leonie Man, MD;  Location: [facility];  Service: Cardiovascular;  Laterality: N/A;  . CARPAL TUNNEL RELEASE Right   . CATARACT EXTRACTION W/ INTRAOCULAR LENS  IMPLANT, BILATERAL Bilateral   . COLONOSCOPY  2008   Negative  . DILATION AND CURETTAGE OF UTERUS  X 3  . LUMBAR LAMINECTOMY Right ?2000 - 2003 X 2   "bone spurs both times"  . TONSILLECTOMY  1940   Social History:   reports that she has never smoked. She has never used smokeless tobacco. She reports that she does not drink alcohol or  use drugs.  Family History  Problem Relation Age of Onset  . Hypertension Father   . Stroke Father   . Kidney failure Mother   . Dementia Brother   . COPD Neg Hx   . Heart disease Neg Hx   . Diabetes Neg Hx     Medications: Patient's Medications  New Prescriptions   No medications on file  Previous Medications   ACETAMINOPHEN (TYLENOL) 500 MG TABLET    Take 500 mg by mouth every 6 (six) hours as needed.   ASPIRIN 325 MG TABLET    Take 325 mg by mouth daily.   CHOLECALCIFEROL (VITAMIN D3) 1000 UNITS CAPS    Take 1,000 Units by mouth daily.   FERROUS SULFATE 325 (65 FE) MG EC TABLET    Take 325 mg by mouth daily with breakfast.    FLUTICASONE-SALMETEROL (ADVAIR DISKUS) 500-50 MCG/DOSE AEPB    Inhale 1 puff into the lungs 2 (two) times daily.   HYDRALAZINE (APRESOLINE) 25 MG TABLET    Take 3 tablets (75 mg total) by mouth every 8 (eight) hours.   ISOSORBIDE MONONITRATE (IMDUR) 30 MG 24 HR TABLET    TAKE 1 TABLET (30 MG TOTAL) BY MOUTH DAILY.   KLOR-CON M20 20 MEQ TABLET    TAKE 1 TABLET (20 MEQ TOTAL) BY MOUTH DAILY.   PRAVASTATIN (PRAVACHOL) 40 MG TABLET    TAKE 1 TABLET (40 MG TOTAL) BY MOUTH DAILY.   SERTRALINE (ZOLOFT) 50 MG TABLET    Take 1 tablet (50 mg total) by mouth daily.   TORSEMIDE (DEMADEX) 20 MG TABLET    TAKE 1 TABLET BY MOUTH EVERY DAY   ULORIC 40 MG TABLET    TAKE 1 TABLET BY MOUTH EVERY DAY   UMECLIDINIUM BROMIDE (INCRUSE ELLIPTA) 62.5 MCG/INH AEPB    Inhale 1 puff into the lungs daily.   VITAMIN B-12 (CYANOCOBALAMIN) 1000 MCG TABLET    Take 1,000 mcg by mouth daily.  Modified Medications   No medications on file  Discontinued Medications   ISOSORBIDE MONONITRATE (IMDUR) 30 MG 24 HR TABLET    Take 1 tablet (30 mg total) by mouth daily.     Physical Exam:  Vitals:   10/12/17 1014  BP: (!) 144/84  Pulse: 69  Resp: 17  Temp: 98.4 F (36.9 C)  TempSrc: Oral  SpO2: 97%  Weight: 170 lb 3.2 oz (77.2 kg)  Height: _0  (1.702 m)   Body mass index is 26.66  kg/m.  Physical Exam  Constitutional: She is oriented to person, place, and time. She appears well-developed and well-nourished. No distress.  HENT:  Head: Normocephalic and atraumatic.  Eyes: Pupils are equal, round, and reactive to light. Conjunctivae and EOM are normal.  Neck: Normal range of motion. Neck supple.  Cardiovascular: Normal rate, regular rhythm, normal heart sounds and intact distal pulses.  Pulmonary/Chest: Effort normal and breath sounds normal. No respiratory distress. She exhibits no mass. Right breast exhibits no inverted nipple, no mass and no tenderness. Left breast exhibits no inverted nipple, no mass and no tenderness.  Abdominal: Soft. Bowel sounds are normal.  Musculoskeletal: Normal range of motion. She exhibits tenderness (bilateral knees ). She exhibits no edema.  Neurological: She is alert and oriented to person, place, and time. She has normal reflexes. No cranial nerve deficit. Coordination normal.  Skin: Skin is warm and dry.  Psychiatric: She has a normal mood and affect.    Labs reviewed: Basic Metabolic Panel:  Recent Labs  01/01/17 1609 01/02/17 0226  03/23/17 1347 03/24/17 0305  03/29/17 1539 06/10/17 1042 10/10/17 0844  NA  --  142  < >  --  140  < > 140 143 146  K  --  2.9*  < >  --  4.0  < > 3.9 3.8 3.8  CL  --  104  < >  --  105  < > 101 106 108  CO2  --  27  < >  --  26  < > _0 GLUCOSE  --  152*  < >  --  119*  < > 87 94 107*  BUN  --  30*  < >  --  41*  < > 32* 43* 44*  CREATININE  --  1.39*  < >  --  1.56*  < > 1.43* 1.72* 1.49*  CALCIUM  --  8.9  < >  --  9.0  < > 9.9 9.1 9.6  MG  --  2.1  --   --  1.3*  --   --   --   --   PHOS 4.5  --   --   --   --   --   --   --   --   TSH  --   --   --  0.669  --   --   --  1.59  --   < > = values in this interval not displayed. Liver Function Tests:  Recent Labs  01/01/17 1046 02/07/17 1033 06/10/17 1042 10/10/17 0844  AST 44* 19 69* 18  ALT 57* 19 66* 13  ALKPHOS 82 73  88  --   BILITOT 1.1 0.5 0.4 0.5  PROT 8.2* 7.0 6.4 6.9  ALBUMIN 3.9 3.9 3.8  --    No results for input(s): LIPASE, AMYLASE in the last 8760 hours. No results for input(s): AMMONIA in the last 8760 hours. CBC:  Recent Labs  02/04/17 1120  03/23/17 0305 06/10/17 1042 10/10/17 0844  WBC 10.7  < > 8.2 3.4* 6.7  NEUTROABS 6,955  --   --  1,666 3,980  HGB 11.7  < > 10.3* 11.2* 10.8*  HCT 36.8  < > 32.5* 34.8* 32.7*  MCV 93.6  < > 93.7 90.6 90.6  PLT 267  < > 155 146 202  < > = values in this interval not displayed. Lipid Panel:  Recent Labs  02/07/17 1033 06/13/17 0932  CHOL 195 144  HDL 66 38*  LDLCALC 106* 86  TRIG 113 102  CHOLHDL 3.0 3.8   TSH:  Recent Labs  03/23/17 1347 06/10/17 1042  TSH 0.669 1.59   A1C: Lab Results  Component Value Date   HGBA1C 6.3 (H) 06/10/2017     Assessment/Plan 1. OSA on CPAP -needing to reorder parts, has not seen pulmonologist  in town therefore will refer for further CPAP orders - Ambulatory referral to Pulmonology  2. Idiopathic gout, unspecified chronicity, unspecified site -without recent flare, uric acid level at goal.   3. Depression, recurrent (HCC) -improved on zoloft 50 mg daily   4. Controlled type 2 diabetes mellitus with stage 3 chronic kidney disease, without long-term current use of insulin (HCC) -stable, diet controlled - Hemoglobin A1c; Future  5. Chronic kidney disease, stage 3 (HCC) -Encourage proper hydration and to avoid NSAIDS (Aleve, Advil, Motrin, Ibuprofen)  - CMP with eGFR; Future  6. Estrogen deficiency - DG Bone Density; Future  7. Chronic combined systolic and diastolic heart failure (HCC) Stable, euvolemic at this time. conts on torsemide 20 mg daily    8. Chronic pain of both knees -previously received knee injections prior to move to [place], has not received in at least a year - DG Knee Complete 4 Views Left; Future - DG Knee Complete 4 Views Right; Future -after xrays obtained has  scheduled appt with Dr Mariea Clonts for knee injections.   9. Hyperlipidemia LDL goal <70 -conts on pravachol and diet modifications  - Lipid Panel; Future  10. Iron deficiency anemia, unspecified iron deficiency anemia type -stable, cont on iron supplement - CBC with Differential/Platelets; Future - Iron, TIBC and Ferritin Panel; Future  11. COPD Stable, conts on advair and incruse  12. CAD Stable, without chest pains, conts on nitrate and asa 325 mg daily   Next appt: 2 weeks with Dr Mariea Clonts for knee injection and 4 months for routine follow up [place]. Harle Battiest  [facility] [phone] 8 am - 5 pm) [phone] (after hours)

## 2017-10-13 ENCOUNTER — Telehealth: Payer: Self-pay

## 2017-10-13 LAB — CBC WITH DIFFERENTIAL/PLATELET
BASOS ABS: 40 {cells}/uL (ref 0–200)
BASOS PCT: 0.6 %
EOS ABS: 683 {cells}/uL — AB (ref 15–500)
EOS PCT: 10.2 %
HCT: 32.7 % — ABNORMAL LOW (ref 35.0–45.0)
Hemoglobin: 10.8 g/dL — ABNORMAL LOW (ref 11.7–15.5)
Lymphs Abs: 1508 cells/uL (ref 850–3900)
MCH: 29.9 pg (ref 27.0–33.0)
MCHC: 33 g/dL (ref 32.0–36.0)
MCV: 90.6 fL (ref 80.0–100.0)
MONOS PCT: 7.3 %
MPV: 12.6 fL — AB (ref 7.5–12.5)
NEUTROS ABS: 3980 {cells}/uL (ref 1500–7800)
Neutrophils Relative %: 59.4 %
PLATELETS: 202 10*3/uL (ref 140–400)
RBC: 3.61 10*6/uL — ABNORMAL LOW (ref 3.80–5.10)
RDW: 13.3 % (ref 11.0–15.0)
TOTAL LYMPHOCYTE: 22.5 %
WBC mixed population: 489 cells/uL (ref 200–950)
WBC: 6.7 10*3/uL (ref 3.8–10.8)

## 2017-10-13 LAB — COMPLETE METABOLIC PANEL WITH GFR
AG RATIO: 1.3 (calc) (ref 1.0–2.5)
ALKALINE PHOSPHATASE (APISO): 98 U/L (ref 33–130)
ALT: 13 U/L (ref 6–29)
AST: 18 U/L (ref 10–35)
Albumin: 3.9 g/dL (ref 3.6–5.1)
BILIRUBIN TOTAL: 0.5 mg/dL (ref 0.2–1.2)
BUN/Creatinine Ratio: 30 (calc) — ABNORMAL HIGH (ref 6–22)
BUN: 44 mg/dL — AB (ref 7–25)
CALCIUM: 9.6 mg/dL (ref 8.6–10.4)
CHLORIDE: 108 mmol/L (ref 98–110)
CO2: 28 mmol/L (ref 20–32)
Creat: 1.49 mg/dL — ABNORMAL HIGH (ref 0.60–0.88)
GFR, EST AFRICAN AMERICAN: 37 mL/min/{1.73_m2} — AB (ref >=60)
GFR, Est Non African American: 32 mL/min/{1.73_m2} — ABNORMAL LOW (ref >=60)
GLUCOSE: 107 mg/dL — AB (ref 65–99)
Globulin: 3 g/dL (calc) (ref 1.9–3.7)
POTASSIUM: 3.8 mmol/L (ref 3.5–5.3)
Sodium: 146 mmol/L (ref 135–146)
TOTAL PROTEIN: 6.9 g/dL (ref 6.1–8.1)

## 2017-10-13 LAB — HEMOGLOBIN A1C
Hgb A1c MFr Bld: 5.7 % of total Hgb — ABNORMAL HIGH (ref <5.7)
Mean Plasma Glucose: 117 (calc)
eAG (mmol/L): 6.5 (calc)

## 2017-10-13 LAB — TEST AUTHORIZATION

## 2017-10-13 LAB — URIC ACID: URIC ACID, SERUM: 5.3 mg/dL (ref 2.5–7.0)

## 2017-10-13 NOTE — Telephone Encounter (Signed)
Left message on voicemail for patient to return call when available, reason for call:  Verify patient requested diabetic supplies from [facility], if yes for to be completed and faxed back.

## 2017-10-14 NOTE — Telephone Encounter (Signed)
Left message on voicemail for patient to return call when available   

## 2017-10-19 IMAGING — DX DG CHEST 2V
2 series · 2 of 2 positions shown · non-contrast
Comparison: None available

CLINICAL DATA: Chest pain, chronic kidney disease, history of CHF,
hypertension, hyperlipidemia and COPD

EXAM:
CHEST  2 VIEW

[w chest pa]
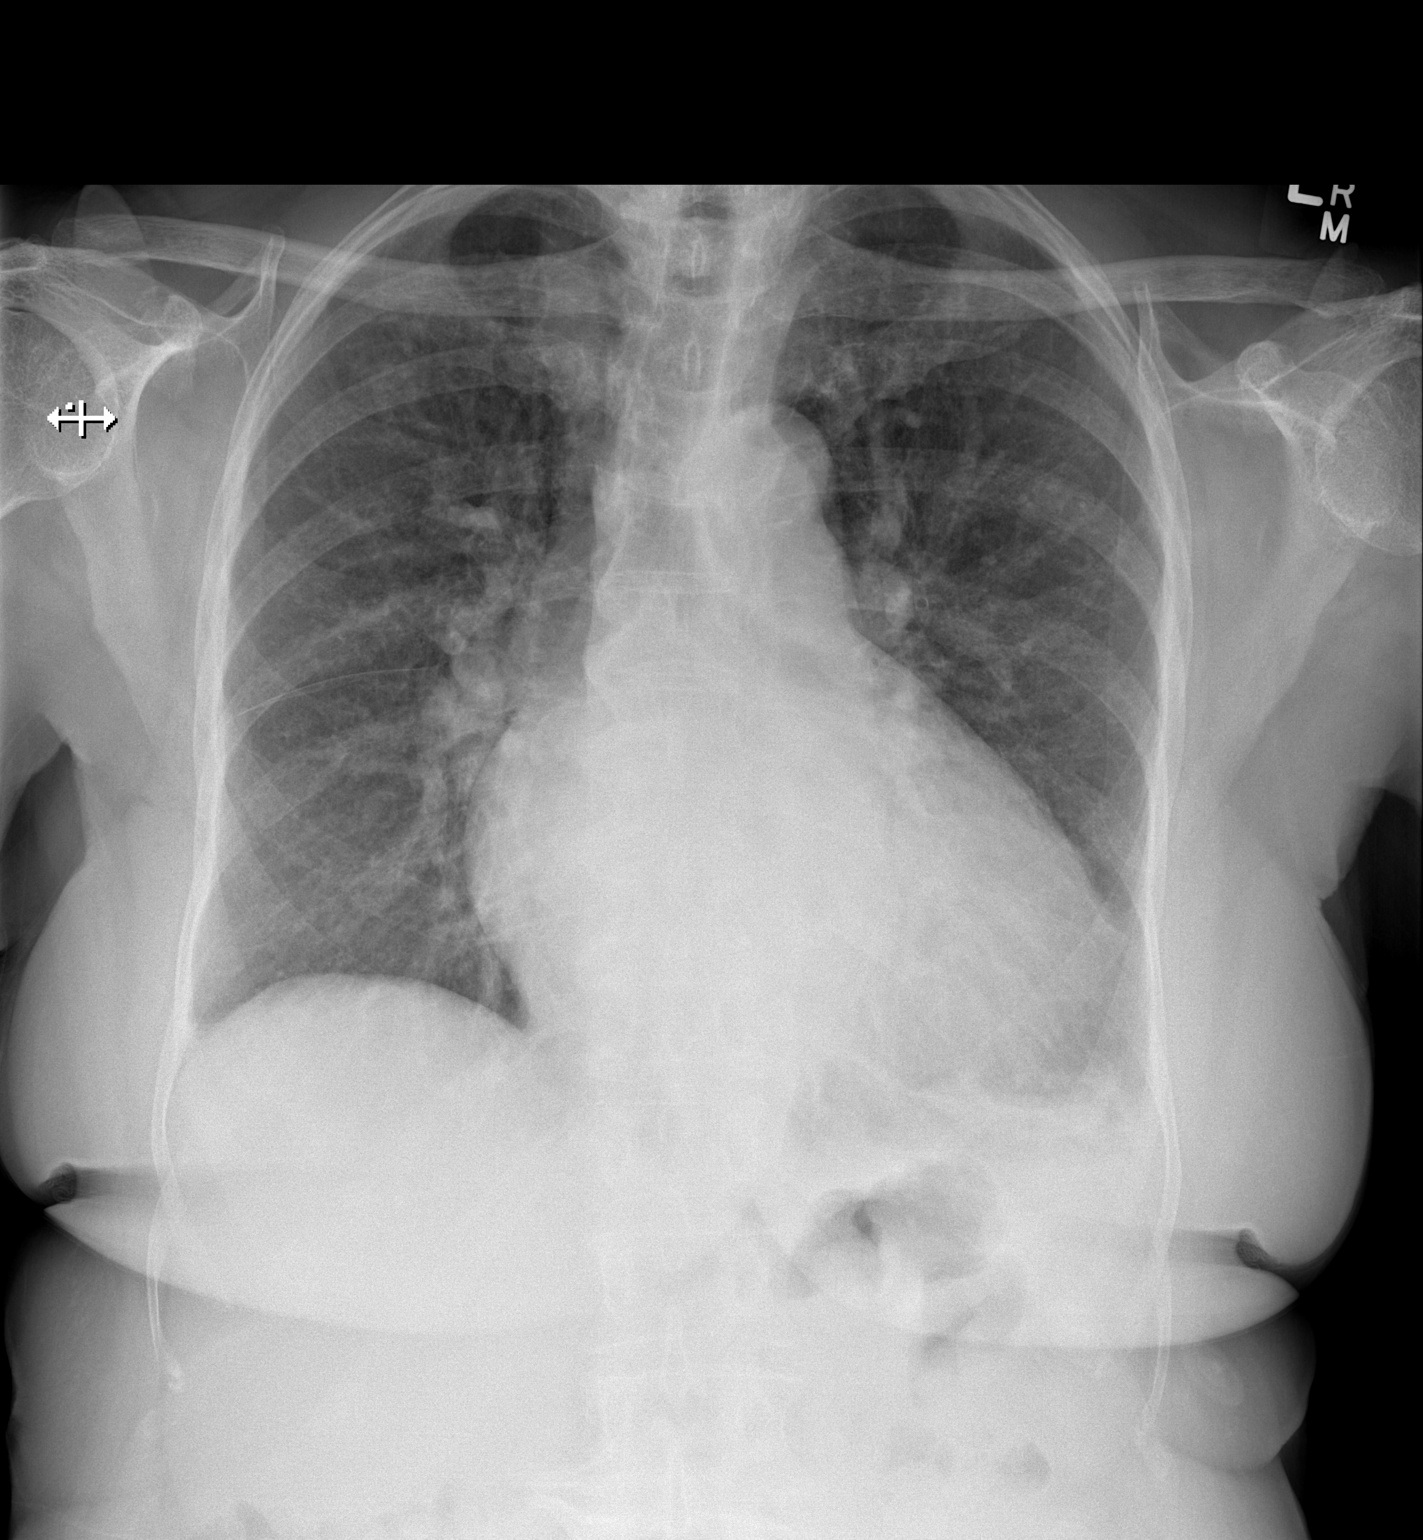

[w chest lat]
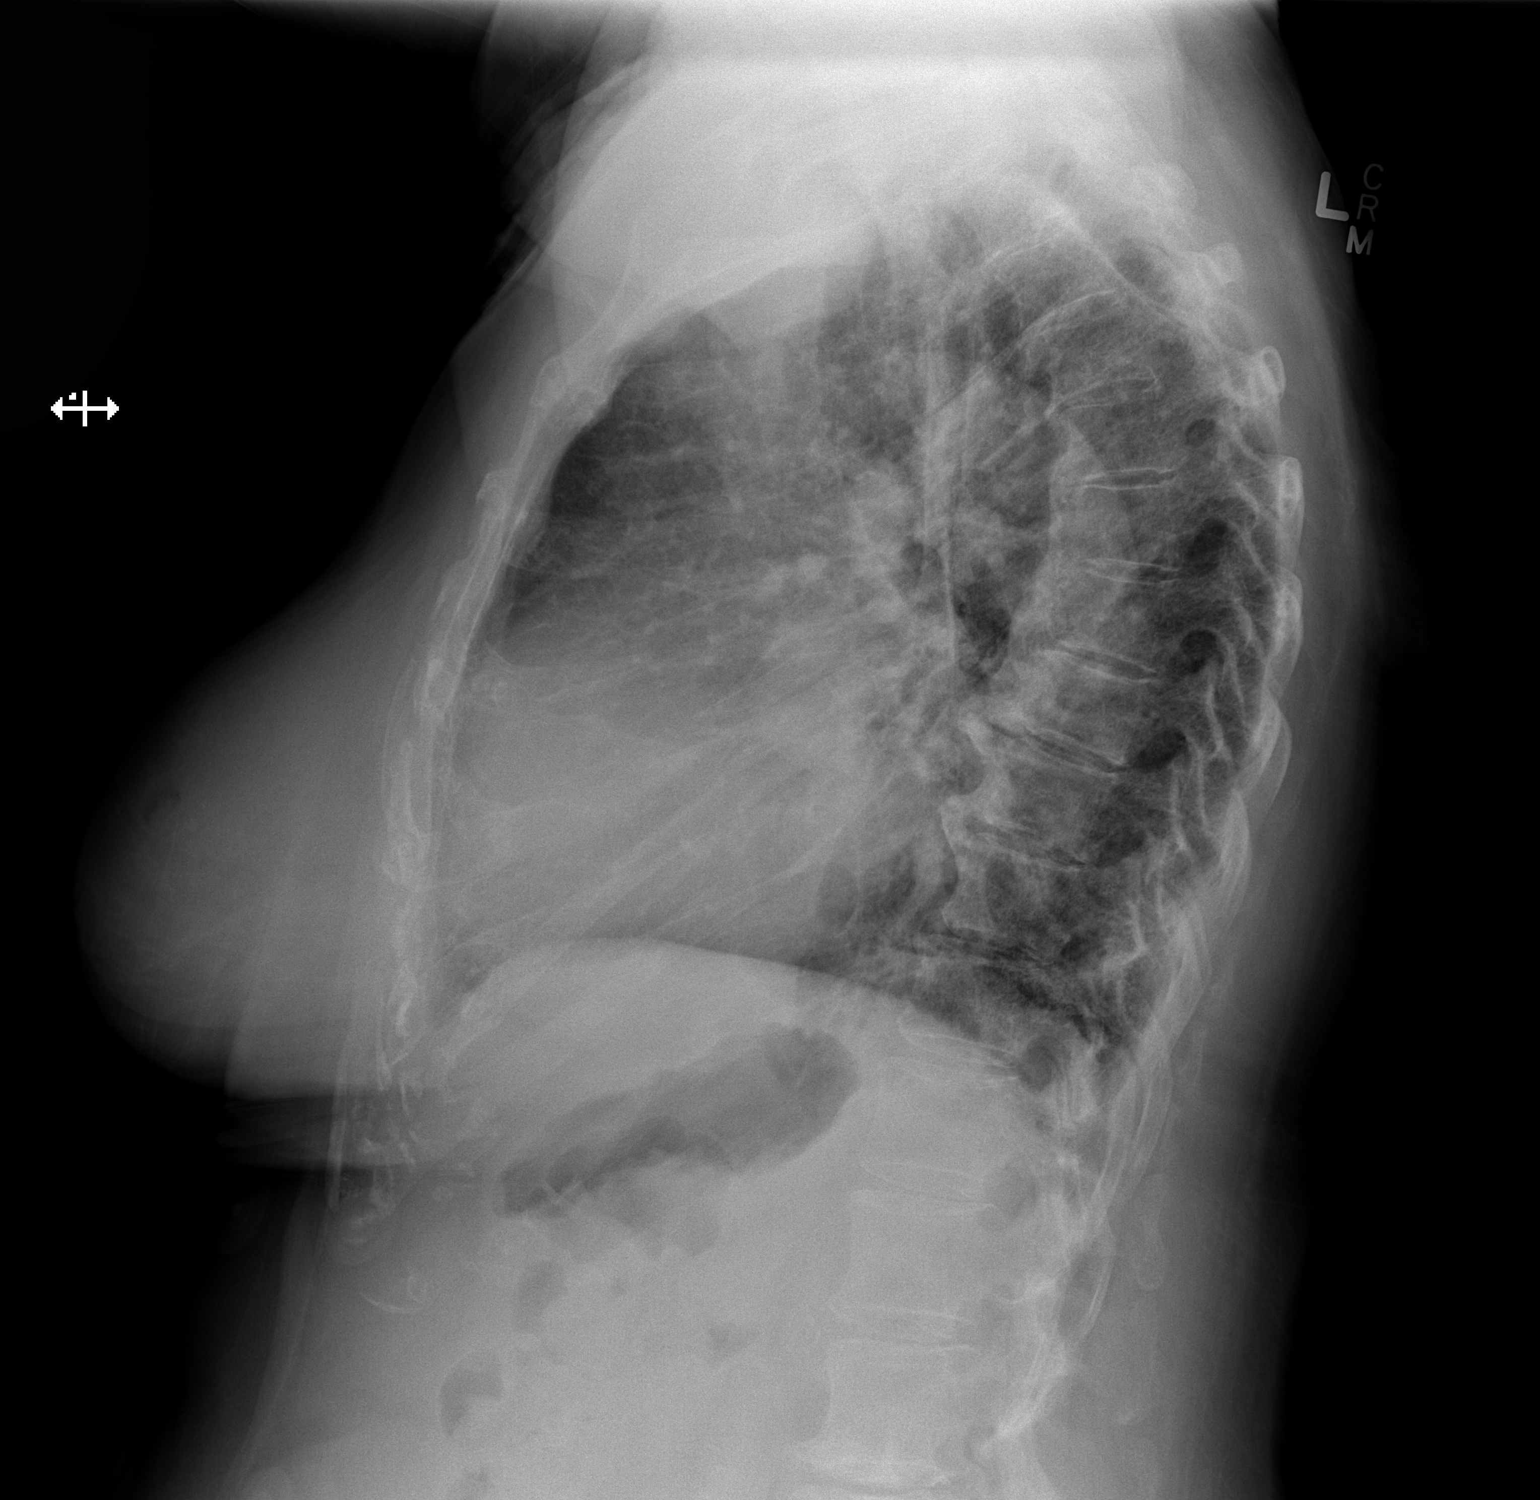

[2 of 2 positions shown; findings below may reference images not displayed]

FINDINGS: Marked cardiomegaly with central vascular congestion and diffuse
interstitial prominence suggesting volume overload versus developing
early edema. Streaky bibasilar bronchovascular opacities, suspect
atelectasis but difficult to exclude mild basilar bronchopneumonia.
These changes are worse in the left lower lobe. No large effusion or
pneumothorax. Atherosclerosis noted of the aorta. Trachea is
midline. Diffuse thoracic spondylosis. No acute compression
fracture.
IMPRESSION: Cardiomegaly with vascular and interstitial prominence suggesting
mild volume overload versus early developing edema.

Mild streaky bibasilar bronchovascular opacities, worse in the left
lower lobe may represent atelectasis versus basilar
bronchopneumonia.

No significant effusion or pneumothorax

Thoracic aortic atherosclerosis

## 2017-10-20 NOTE — Telephone Encounter (Signed)
I called Kelly [name] at [phone] to ask if patient had received flu vaccine at their office. They verified that patient had high dose flu vaccine on 10/04/17. Patient's chart has been updated.

## 2017-10-20 NOTE — Telephone Encounter (Signed)
Spoke with patient, patient states she requested supplies from [facility].  Form completed and faxed back to [phone]

## 2017-10-21 ENCOUNTER — Encounter: Payer: Self-pay | Admitting: Cardiology

## 2017-10-27 ENCOUNTER — Encounter: Payer: Self-pay | Admitting: Internal Medicine

## 2017-10-27 ENCOUNTER — Ambulatory Visit (INDEPENDENT_AMBULATORY_CARE_PROVIDER_SITE_OTHER): Payer: Medicare Other | Admitting: Internal Medicine

## 2017-10-27 VITALS — BP 150/80 | HR 74 | Temp 99.0°F | Wt 173.0 lb

## 2017-10-27 DIAGNOSIS — M170 Bilateral primary osteoarthritis of knee: Secondary | ICD-10-CM

## 2017-10-27 DIAGNOSIS — I25110 Atherosclerotic heart disease of native coronary artery with unstable angina pectoris: Secondary | ICD-10-CM

## 2017-10-27 MED ORDER — METHYLPREDNISOLONE ACETATE 40 MG/ML IJ SUSP
40.0000 mg | Freq: Once | INTRAMUSCULAR | Status: AC
  Administered 2017-10-27: 40 mg via INTRA_ARTICULAR

## 2017-10-27 NOTE — Patient Instructions (Signed)
Knee Injection, Care After  Refer to this sheet in the next few weeks. These instructions provide you with information about caring for yourself after your procedure. Your health care provider may also give you more specific instructions. Your treatment has been planned according to current medical practices, but problems sometimes occur. Call your health care provider if you have any problems or questions after your procedure.  What can I expect after the procedure?  After the procedure, it is common to have:   Soreness.   Warmth.   Swelling.    You may have more pain, swelling, and warmth than you did before the injection. This reaction may last for about one day.  Follow these instructions at home:  Bathing   If you were given a bandage (dressing), keep it dry until your health care provider says it can be removed. Ask your health care provider when you can start showering or taking a bath.  Managing pain, stiffness, and swelling   If directed, apply ice to the injection area:  ? Put ice in a plastic bag.  ? Place a towel between your skin and the bag.  ? Leave the ice on for 20 minutes, 2-3 times per day.   Do not apply heat to your knee.   Raise the injection area above the level of your heart while you are sitting or lying down.  Activity   Avoid strenuous activities for as long as directed by your health care provider. Ask your health care provider when you can return to your normal activities.  General instructions   Take medicines only as directed by your health care provider.   Do not take aspirin or other over-the-counter medicines unless your health care provider says you can.   Check your injection site every day for signs of infection. Watch for:  ? Redness, swelling, or pain.  ? Fluid, blood, or pus.   Follow your health care provider's instructions about dressing changes and removal.  Contact a health care provider if:   You have symptoms at your injection site that last longer than  two days after your procedure.   You have redness, swelling, or pain in your injection area.   You have fluid, blood, or pus coming from your injection site.   You have warmth in your injection area.   You have a fever.   Your pain is not controlled with medicine.  Get help right away if:   Your knee turns very red.   Your knee becomes very swollen.   Your knee pain is severe.  This information is not intended to replace advice given to you by your health care provider. Make sure you discuss any questions you have with your health care provider.  Document Released: 12/20/2014 Document Revised: 08/04/2016 Document Reviewed: 10/09/2014  Elsevier Interactive Patient Education  2018 [employer]vier Inc[employer]

## 2017-10-27 NOTE — Progress Notes (Signed)
Location:  [facility] clinic Provider: Keeshawn Fakhouri L. Renato Gails, D.O., C.M.D.  Code Status: full code Goals of Care:  Advanced Directives 10/12/2017  Does Patient Have a Medical Advance Directive? No  Type of Advance Directive -  Would patient like information on creating a medical advance directive? No - Patient declined   Chief Complaint  Patient presents with  . Acute Visit    Knee injection    HPI: Patient is a 83 y.o. female seen today for an acute visit for bilateral knee injections. She's followed by NP Janyth Contes for her knee osteoarthritis among multiple other comorbidities.  She's been taking tylenol 500mg  every 6 hrs as needed for pain.  She also has a h/o gout of her left ankle and enthesopathy.  She has also tried biofreeze for her knee pain w/o benefit. NSAIDS are not a good option for her with her DMI and CKD.  Has had prior knee injections several years ago--the first worked for a year but those after that did not help her.  xrays in 2015 showed degenerative changes.    Past Medical History:  Diagnosis Date  . Anemia   . Anginal pain (HCC)   . Aortic stenosis   . Arthritis    "legs, hands" (03/23/2017)  . Asthma   . CHF (congestive heart failure) (HCC)   . CKD (chronic kidney disease), stage III (HCC)   . COPD (chronic obstructive pulmonary disease) (HCC)   . Depression   . Esophageal disorder   . Fibromyalgia   . GERD (gastroesophageal reflux disease)   . Glaucoma 2009  . Heart murmur   . Hyperlipidemia   . Hypertension   . LBBB (left bundle branch block)   . Lumbar disc disease   . Menorrhagia   . NSTEMI (non-ST elevated myocardial infarction) (HCC) 1980s X 3; 12/2016   "light ones" (03/23/2017); [name] 01/11/2017  . OSA on CPAP    "since 2013"  . Pneumonia 1980s X 1; 12/2016  . Primary Parkinsonism (HCC)   . Renal insufficiency   . Tonsillitis   . Type II diabetes mellitus (HCC)     Past Surgical History:  Procedure Laterality Date  . ABDOMINAL HYSTERECTOMY  1969    . APPENDECTOMY  1940  . BACK SURGERY    . CARDIAC CATHETERIZATION N/A 01/06/2017   Procedure: Right/Left Heart Cath and Coronary Angiography;  Surgeon: Marykay Lex, MD;  Location: [facility] INVASIVE [facility];  Service: Cardiovascular;  Laterality: N/A;  . CARPAL TUNNEL RELEASE Right   . CATARACT EXTRACTION W/ INTRAOCULAR LENS  IMPLANT, BILATERAL Bilateral   . COLONOSCOPY  2008   Negative  . DILATION AND CURETTAGE OF UTERUS  X 3  . LUMBAR LAMINECTOMY Right ?2000 - 2003 X 2   "bone spurs both times"  . TONSILLECTOMY  1940    No Known Allergies  Outpatient Encounter Medications as of 10/27/2017  Medication Sig  . acetaminophen (TYLENOL) 500 MG tablet Take 500 mg by mouth every 6 (six) hours as needed.  Marland Kitchen aspirin 325 MG tablet Take 325 mg by mouth daily.  . Cholecalciferol (VITAMIN D3) 1000 units CAPS Take 1,000 Units by mouth daily.  . ferrous sulfate 325 (65 FE) MG EC tablet Take 325 mg by mouth daily with breakfast.   . Fluticasone-Salmeterol (ADVAIR DISKUS) 500-50 MCG/DOSE AEPB Inhale 1 puff into the lungs 2 (two) times daily.  . hydrALAZINE (APRESOLINE) 25 MG tablet Take 3 tablets (75 mg total) by mouth every 8 (eight) hours.  . isosorbide  mononitrate (IMDUR) 30 MG 24 hr tablet TAKE 1 TABLET (30 MG TOTAL) BY MOUTH DAILY.  Marland Kitchen KLOR-CON M20 20 MEQ tablet TAKE 1 TABLET (20 MEQ TOTAL) BY MOUTH DAILY.  . pravastatin (PRAVACHOL) 40 MG tablet Take 1 tablet (40 mg total) by mouth daily.  . sertraline (ZOLOFT) 50 MG tablet Take 1 tablet (50 mg total) by mouth daily.  Marland Kitchen torsemide (DEMADEX) 20 MG tablet TAKE 1 TABLET BY MOUTH EVERY DAY  . ULORIC 40 MG tablet TAKE 1 TABLET BY MOUTH EVERY DAY  . umeclidinium bromide (INCRUSE ELLIPTA) 62.5 MCG/INH AEPB Inhale 1 puff into the lungs daily.  . vitamin B-12 (CYANOCOBALAMIN) 1000 MCG tablet Take 1,000 mcg by mouth daily.   No facility-administered encounter medications on file as of 10/27/2017.     Review of Systems:  Review of Systems  Constitutional:  Negative for chills and fever.  Musculoskeletal: Positive for joint pain. Negative for falls.  Skin: Negative for itching and rash.  Neurological: Positive for tingling and sensory change. Negative for weakness.       Left CTS    Health Maintenance  Topic Date Due  . DEXA SCAN  06/26/1998  . HEMOGLOBIN A1C  04/10/2018  . FOOT EXAM  07/12/2018  . URINE MICROALBUMIN  07/12/2018  . OPHTHALMOLOGY EXAM  07/21/2018  . TETANUS/TDAP  12/14/2025  . INFLUENZA VACCINE  Completed  . PNA vac Low Risk Adult  Completed    Physical Exam: Vitals:   10/27/17 0845  BP: (!) 150/80  Pulse: 74  Temp: 99 F (37.2 C)  TempSrc: Oral  SpO2: 96%  Weight: 173 lb (78.5 kg)   Body mass index is 27.1 kg/m. Physical Exam  Constitutional: She appears well-developed and well-nourished. No distress.  Musculoskeletal:  Ambulates with cane for support, tenderness bilateral medial knees with effusions, crepitus left medial knee    Labs reviewed: Basic Metabolic Panel: Recent Labs    01/01/17 1609 01/02/17 0226  03/23/17 1347 03/24/17 0305  03/29/17 1539 06/10/17 1042 10/10/17 0844  NA  --  142   < >  --  140   < > 140 143 146  K  --  2.9*   < >  --  4.0   < > 3.9 3.8 3.8  CL  --  104   < >  --  105   < > 101 106 108  CO2  --  27   < >  --  26   < > 24 24 28   GLUCOSE  --  152*   < >  --  119*   < > 87 94 107*  BUN  --  30*   < >  --  41*   < > 32* 43* 44*  CREATININE  --  1.39*   < >  --  1.56*   < > 1.43* 1.72* 1.49*  CALCIUM  --  8.9   < >  --  9.0   < > 9.9 9.1 9.6  MG  --  2.1  --   --  1.3*  --   --   --   --   PHOS 4.5  --   --   --   --   --   --   --   --   TSH  --   --   --  0.669  --   --   --  1.59  --    < > = values in this interval  not displayed.   Liver Function Tests: Recent Labs    01/01/17 1046 02/07/17 1033 06/10/17 1042 10/10/17 0844  AST 44* 19 69* 18  ALT 57* 19 66* 13  ALKPHOS 82 73 88  --   BILITOT 1.1 0.5 0.4 0.5  PROT 8.2* 7.0 6.4 6.9  ALBUMIN 3.9 3.9 3.8   --    No results for input(s): LIPASE, AMYLASE in the last 8760 hours. No results for input(s): AMMONIA in the last 8760 hours. CBC: Recent Labs    02/04/17 1120  03/23/17 0305 06/10/17 1042 10/10/17 0844  WBC 10.7   < > 8.2 3.4* 6.7  NEUTROABS 6,955  --   --  1,666 3,980  HGB 11.7   < > 10.3* 11.2* 10.8*  HCT 36.8   < > 32.5* 34.8* 32.7*  MCV 93.6   < > 93.7 90.6 90.6  PLT 267   < > 155 146 202   < > = values in this interval not displayed.   Lipid Panel: Recent Labs    02/07/17 1033 06/13/17 0932  CHOL 195 144  HDL 66 38*  LDLCALC 106* 86  TRIG 113 102  CHOLHDL 3.0 3.8   Lab Results  Component Value Date   HGBA1C 5.7 (H) 10/10/2017    Assessment/Plan 1. Bilateral primary osteoarthritis of knees Procedure note left knee injection verbal consent was obtained to inject left knee joint  Timeout was completed to confirm the site of injection  The medications used were 40 mg of Depo-Medrol and 1% lidocaine 3 cc  Anesthesia was provided by ethyl chloride and the skin was prepped with alcohol.  After cleaning the skin with alcohol a 20-gauge needle was used to inject the left knee joint. There were no complications. A sterile bandage was applied.   Procedure note right knee injection verbal consent was obtained to inject right knee joint  Timeout was completed to confirm the site of injection  The medications used were 40 mg of Depo-Medrol and 1% lidocaine 3 cc  Anesthesia was provided by ethyl chloride and the skin was prepped with alcohol.  After cleaning the skin with alcohol a 20-gauge needle was used to inject the right knee joint. There were no complications. A sterile bandage was applied.  Labs/tests ordered: no new Next appt:  02/06/2018 keep as scheduled with Shanda BumpsJessica  Nashon Erbes L. Jannett Schmall, D.O. Geriatrics [facility] [address] [place], [place] [address] Cell Phone (Mon-Fri 8am-5pm):  [phone] On Call:   [phone] & follow prompts after 5pm & weekends Office Phone:  [phone] Office Fax:  [phone]

## 2017-10-29 NOTE — Progress Notes (Signed)
Cardiology Office Note    Date:  11/01/2017   ID:  Kelly [name], DOB 22-Feb-1931, MRN [id number]  PCP:  Sharon Seller, NP  Cardiologist:  Nathanal Hermiz [place], MD    History of Present Illness:  Kelly [name] is a 83 y.o. female seen for  follow up of CHF. She was admitted 1/20-1/27/18 for acute respiratory failure with hypoxia secondary to acute combined systolic and diastolic CHF. She has a history of COPD, apparent old left bundle branch block, and reported history of "3 heart attacks" in the past. States that she followed with a physician in [place] for prior cardiac events, does not indicate any prior cardiac catheterization or PCI. She presented with worsening shortness of breath  with coughing. Chest x-ray was concerning for possible associated pneumonia versus asymmetric edema. She was treated with  Antibiotics. Cardiac enzymes suggest NSTEMI with peak troponin 4.91. Echo showed EF 35-40% with severe LVH and restrictive parameters. Cardiac cath showed pulmonary HTN with elevated filling pressures. Normal coronary anatomy.  She was diuresed with improvement. Started on bisoprolol (dose reduced due to bradycardia). ARB held due to ARF.   She was admitted in April 2018 with CHF exacerbation. She was diuresed with IV lasix and transitioned back to torsemide po. Bystolic held due to bradycardia. Started on Imdur and hydralazine. When seen later in April she seemed to be doing well.   On follow up today she is doing well. She does complain of some chest pain that is central and just goes away on its own. She thinks it is gas and she has some belching. She denies increase in weight or swelling. No other new medical issues.      Past Medical History:  Diagnosis Date  . Anemia   . Anginal pain (HCC)   . Aortic stenosis   . Arthritis    "legs, hands" (03/23/2017)  . Asthma   . CHF (congestive heart failure) (HCC)   . CKD (chronic kidney disease), stage III (HCC)   . COPD (chronic  obstructive pulmonary disease) (HCC)   . Depression   . Esophageal disorder   . Fibromyalgia   . GERD (gastroesophageal reflux disease)   . Glaucoma 2009  . Heart murmur   . Hyperlipidemia   . Hypertension   . LBBB (left bundle branch block)   . Lumbar disc disease   . Menorrhagia   . NSTEMI (non-ST elevated myocardial infarction) (HCC) 1980s X 3; 12/2016   "light ones" (03/23/2017); [name] 01/11/2017  . OSA on CPAP    "since 2013"  . Pneumonia 1980s X 1; 12/2016  . Primary Parkinsonism (HCC)   . Renal insufficiency   . Tonsillitis   . Type II diabetes mellitus (HCC)     Past Surgical History:  Procedure Laterality Date  . ABDOMINAL HYSTERECTOMY  1969  . APPENDECTOMY  1940  . BACK SURGERY    . CARPAL TUNNEL RELEASE Right   . CATARACT EXTRACTION W/ INTRAOCULAR LENS  IMPLANT, BILATERAL Bilateral   . COLONOSCOPY  2008   Negative  . DILATION AND CURETTAGE OF UTERUS  X 3  . LUMBAR LAMINECTOMY Right ?2000 - 2003 X 2   "bone spurs both times"  . Right/Left Heart Cath and Coronary Angiography N/A 01/06/2017   Performed by Marykay Lex, MD at [facility] INVASIVE CV LAB  . TONSILLECTOMY  1940    Current Medications: Outpatient Medications Prior to Visit  Medication Sig Dispense Refill  . acetaminophen (TYLENOL) 500 MG tablet Take 500 mg  by mouth every 6 (six) hours as needed.    . Cholecalciferol (VITAMIN D3) 1000 units CAPS Take 1,000 Units by mouth daily.    . ferrous sulfate 325 (65 FE) MG EC tablet Take 325 mg by mouth daily with breakfast.     . Fluticasone-Salmeterol (ADVAIR DISKUS) 500-50 MCG/DOSE AEPB Inhale 1 puff into the lungs 2 (two) times daily. 60 each 3  . hydrALAZINE (APRESOLINE) 25 MG tablet Take 3 tablets (75 mg total) by mouth every 8 (eight) hours. 90 tablet 1  . isosorbide mononitrate (IMDUR) 30 MG 24 hr tablet TAKE 1 TABLET (30 MG TOTAL) BY MOUTH DAILY. 30 tablet 0  . KLOR-CON M20 20 MEQ tablet TAKE 1 TABLET (20 MEQ TOTAL) BY MOUTH DAILY. 30 tablet 2  .  pravastatin (PRAVACHOL) 40 MG tablet Take 1 tablet (40 mg total) by mouth daily. 90 tablet 1  . sertraline (ZOLOFT) 50 MG tablet Take 1 tablet (50 mg total) by mouth daily. 90 tablet 1  . torsemide (DEMADEX) 20 MG tablet TAKE 1 TABLET BY MOUTH EVERY DAY 90 tablet 1  . ULORIC 40 MG tablet TAKE 1 TABLET BY MOUTH EVERY DAY 90 tablet 1  . umeclidinium bromide (INCRUSE ELLIPTA) 62.5 MCG/INH AEPB Inhale 1 puff into the lungs daily. 30 each 3  . vitamin B-12 (CYANOCOBALAMIN) 1000 MCG tablet Take 1,000 mcg by mouth daily.    Marland Kitchen. aspirin 325 MG tablet Take 325 mg by mouth daily.     No facility-administered medications prior to visit.      Allergies:   Patient has no known allergies.   Social History   Socioeconomic History  . Marital status: Widowed    Spouse name: None  . Number of children: None  . Years of education: None  . Highest education level: None  Social Needs  . Financial resource strain: None  . Food insecurity - worry: None  . Food insecurity - inability: None  . Transportation needs - medical: None  . Transportation needs - non-medical: None  Occupational History  . None  Tobacco Use  . Smoking status: Never Smoker  . Smokeless tobacco: Never Used  Substance and Sexual Activity  . Alcohol use: No  . Drug use: No  . Sexual activity: No  Other Topics Concern  . None  Social History Narrative   Social History      Diet? never      Do you drink/eat things with caffeine? coffee      Marital status?        widow                            What year were you married? 1955      Do you live in a house, apartment, assisted living, condo, trailer, etc.? house      Is it one or more stories? 2      How many persons live in your home? 3      Do you have any pets in your home? (please list) no      Highest level of education completed? 12 grade      Current or past profession: no      Advanced Directives      Do you exercise?      no  Type & how often?      Do you have a living will? no       Do you have a DNR form?    no                              If not, do you want to discuss one?      Do you have signed POA/HPOA for forms?  no      Functional Status      Do you have difficulty bathing or dressing yourself? no      Do you have difficulty preparing food or eating? no      Do you have difficulty managing your medications? no      Do you have difficulty managing your finances? no      Do you have difficulty affording your medications?           Family History:  The patient's family history includes Dementia in her brother; Hypertension in her father; Kidney failure in her mother; Stroke in her father.   ROS:   Please see the history of present illness.    ROS All other systems reviewed and are negative.   PHYSICAL EXAM:   VS:  BP 122/64   Pulse 69   Ht 5\' 7"  (1.702 m)   Wt 169 lb 3.2 oz (76.7 kg)   BMI 26.50 kg/m    GENERAL:  Well appearing, elderly BF in NAD HEENT:  PERRL, EOMI, sclera are clear. Oropharynx is clear. NECK:  No jugular venous distention, carotid upstroke brisk and symmetric, no bruits, no thyromegaly or adenopathy LUNGS:  Clear to auscultation bilaterally CHEST:  Unremarkable HEART:  RRR,  PMI not displaced or sustained,S1 and S2 within normal limits, no S3, no S4: no clicks, no rubs, no murmurs ABD:  Soft, nontender. BS +, no masses or bruits. No hepatomegaly, no splenomegaly EXT:  2 + pulses throughout, no edema, no cyanosis no clubbing SKIN:  Warm and dry.  No rashes NEURO:  Alert and oriented x 3. Cranial nerves II through XII intact. Uses a cane PSYCH:  Cognitively intact    Wt Readings from Last 3 Encounters:  11/01/17 169 lb 3.2 oz (76.7 kg)  10/27/17 173 lb (78.5 kg)  10/12/17 170 lb 3.2 oz (77.2 kg)      Studies/Labs Reviewed:   EKG:  EKG is not ordered today.  The ekg ordered today demonstrates N/A  Recent Labs: 02/04/2017: Brain Natriuretic Peptide  500.8 03/24/2017: Magnesium 1.3 06/10/2017: TSH 1.59 10/10/2017: ALT 13; BUN 44; Creat 1.49; Hemoglobin 10.8; Platelets 202; Potassium 3.8; Sodium 146   Lipid Panel    Component Value Date/Time   CHOL 144 06/13/2017 0932   TRIG 102 06/13/2017 0932   HDL 38 (L) 06/13/2017 0932   CHOLHDL 3.8 06/13/2017 0932   VLDL 20 06/13/2017 0932   LDLCALC 86 06/13/2017 0932    Additional studies/ records that were reviewed today include:  Echo 01/03/17: Study Conclusions  - Left ventricle: The cavity size was normal. There was severe   concentric hypertrophy. Systolic function was moderately reduced.   The estimated ejection fraction was in the range of 35% to 40%.   Wall motion was normal; there were no regional wall motion   abnormalities. Features are consistent with a pseudonormal left   ventricular filling pattern, with concomitant abnormal relaxation   and increased filling pressure (grade 2 diastolic dysfunction).   Doppler  parameters are consistent with elevated ventricular   end-diastolic filling pressure. - Aortic root: The aortic root was normal in size. - Mitral valve: There was mild regurgitation. - Left atrium: The atrium was moderately dilated. - Right ventricle: Systolic function was normal. - Right atrium: The atrium was moderately dilated. - Tricuspid valve: There was moderate regurgitation. - Pulmonic valve: There was mild regurgitation. - Pulmonary arteries: PA peak pressure: 62 mm Hg (S). - Inferior vena cava: The vessel was dilated. The respirophasic   diameter changes were blunted (< 50%), consistent with elevated   central venous pressure. - Pericardium, extracardiac: There was no pericardial effusion.  Impressions:  - Severe concentric left ventricular hypertrophy with mildly   decreased LVEF and diffuse hypokinesis.   A cardiac MRI is recommended to evaluate for possible   infiltrative cardiomyopathy.  Cardiac cath 01/06/17: Conclusion     LV end  diastolic pressure is moderately elevated consistent with moderately elevated mean pulmonary pressures.  Hemodynamic findings consistent with mild secondary pulmonary hypertension.  Angiographically normal coronary arteries with a right dominant system. Somewhat tortuous RCA and circumflex/ramus systems    Nonischemic Cardiomyopathy with moderate to severely reduced cardiac output/index. Persistently elevated filling pressures indicating continued volume overload     ASSESSMENT:    1. Chronic systolic heart failure (HCC)   2. Essential hypertension   3. Hyperlipidemia, unspecified hyperlipidemia type   4. LBBB (left bundle branch block)      PLAN:  In order of problems listed above:  1. CHF appears to be well compensated. Weight is stable and she has no edema today.  Will continue current therapy with nitrates, hydralazine, and torsemide.  Beta blocker held due to bradycardia.  Will hold ACEi/ARB/ aldactone with CKD.  I will follow up in 6 months. Will reduce ASA to 81 mg daily 2. Hypertensive heart disease with CHF. BP under  good control.  3. CKD stage 3- last renal indices stable.  4. Chest pain. No CAD by prior Cath- I suspect this is GI related.     Medication Adjustments/Labs and Tests Ordered: Current medicines are reviewed at length with the patient today.  Concerns regarding medicines are outlined above.  Medication changes, Labs and Tests ordered today are listed in the Patient Instructions below. Patient Instructions  Reduce ASA to 81 mg daily  Continue your current medication and sodium restriction.  I will see you in 6 months     Signed, Abdias Hickam [place], MD  11/01/2017 9:17 AM    [facility] [address], [place], [place], [address] [phone]

## 2017-11-01 ENCOUNTER — Encounter: Payer: Self-pay | Admitting: Cardiology

## 2017-11-01 ENCOUNTER — Ambulatory Visit (INDEPENDENT_AMBULATORY_CARE_PROVIDER_SITE_OTHER): Payer: Medicare Other | Admitting: Cardiology

## 2017-11-01 VITALS — BP 122/64 | HR 69 | Ht 67.0 in | Wt 169.2 lb

## 2017-11-01 DIAGNOSIS — I447 Left bundle-branch block, unspecified: Secondary | ICD-10-CM | POA: Diagnosis not present

## 2017-11-01 DIAGNOSIS — I5022 Chronic systolic (congestive) heart failure: Secondary | ICD-10-CM

## 2017-11-01 DIAGNOSIS — E785 Hyperlipidemia, unspecified: Secondary | ICD-10-CM

## 2017-11-01 DIAGNOSIS — I10 Essential (primary) hypertension: Secondary | ICD-10-CM | POA: Diagnosis not present

## 2017-11-01 DIAGNOSIS — I25110 Atherosclerotic heart disease of native coronary artery with unstable angina pectoris: Secondary | ICD-10-CM | POA: Diagnosis not present

## 2017-11-01 MED ORDER — ASPIRIN EC 81 MG PO TBEC: 81.0000 mg | DELAYED_RELEASE_TABLET | Freq: Every day | ORAL | 3 refills | Status: AC

## 2017-11-01 NOTE — Patient Instructions (Addendum)
Reduce ASA to 81 mg daily  Continue your current medication and sodium restriction.  I will see you in 6 months

## 2017-11-02 ENCOUNTER — Other Ambulatory Visit: Payer: Self-pay | Admitting: Nurse Practitioner

## 2017-11-02 DIAGNOSIS — I5042 Chronic combined systolic (congestive) and diastolic (congestive) heart failure: Principal | ICD-10-CM

## 2017-11-02 DIAGNOSIS — I110 Hypertensive heart disease with heart failure: Secondary | ICD-10-CM

## 2017-11-15 DIAGNOSIS — R109 Unspecified abdominal pain: Secondary | ICD-10-CM | POA: Diagnosis not present

## 2017-11-18 IMAGING — CR DG ANKLE COMPLETE 3+V*L*
3 series · 3 of 3 positions shown · non-contrast
Comparison: None.

CLINICAL DATA: Left ankle pain and swelling. Acute drug induced
gout of the left ankle.

EXAM:
LEFT ANKLE COMPLETE - 3+ VIEW

[x ankle ap left]
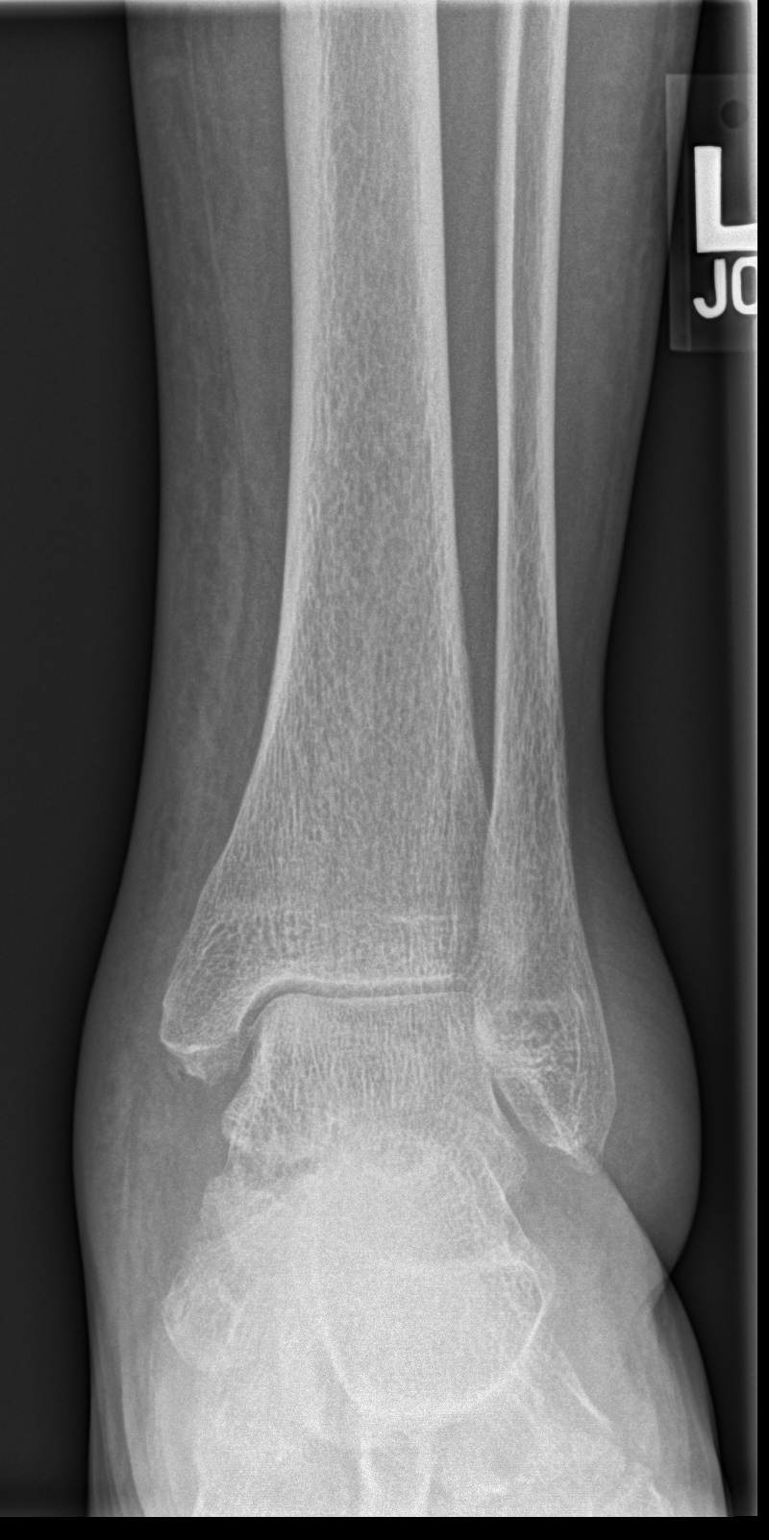

[x ankle obl left]
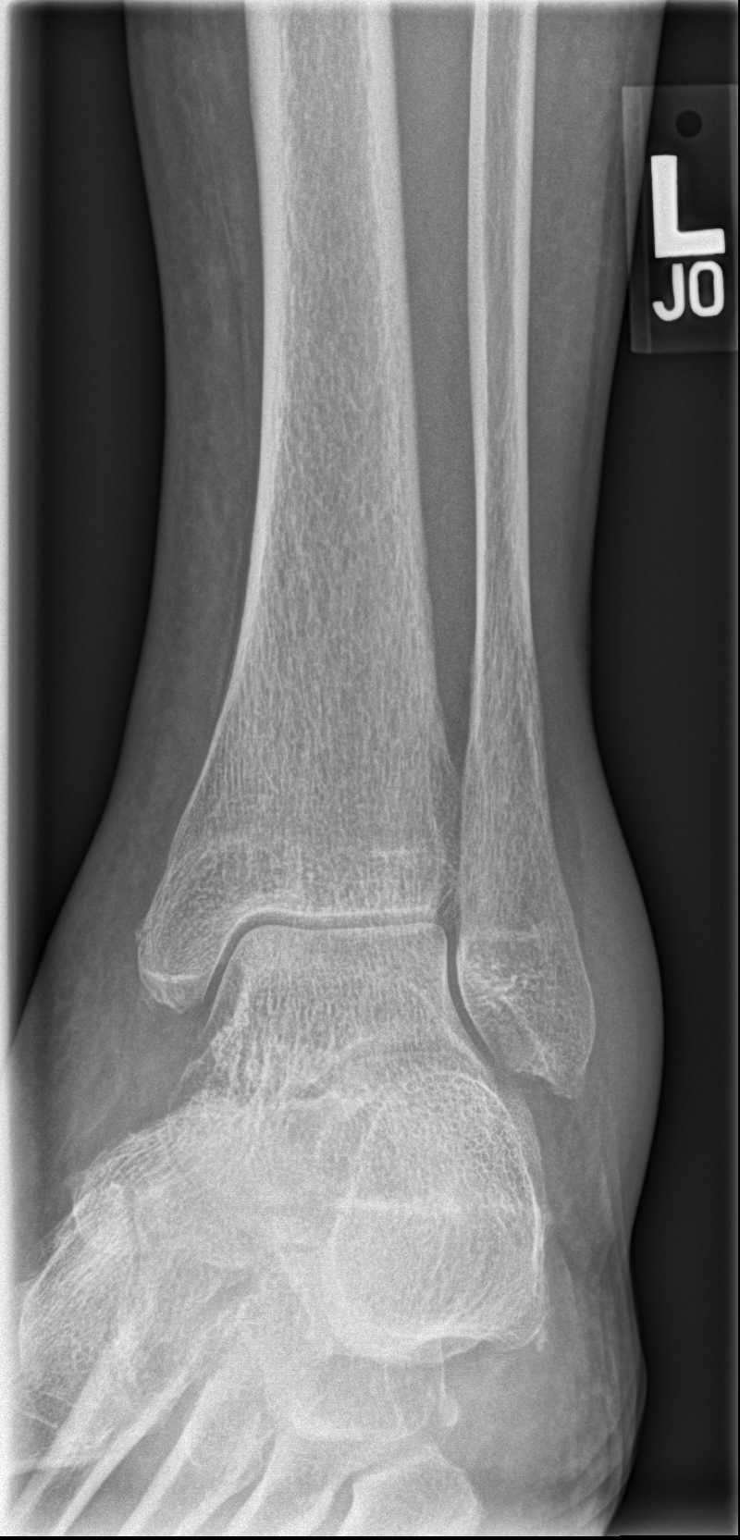

[x ankle lat left]
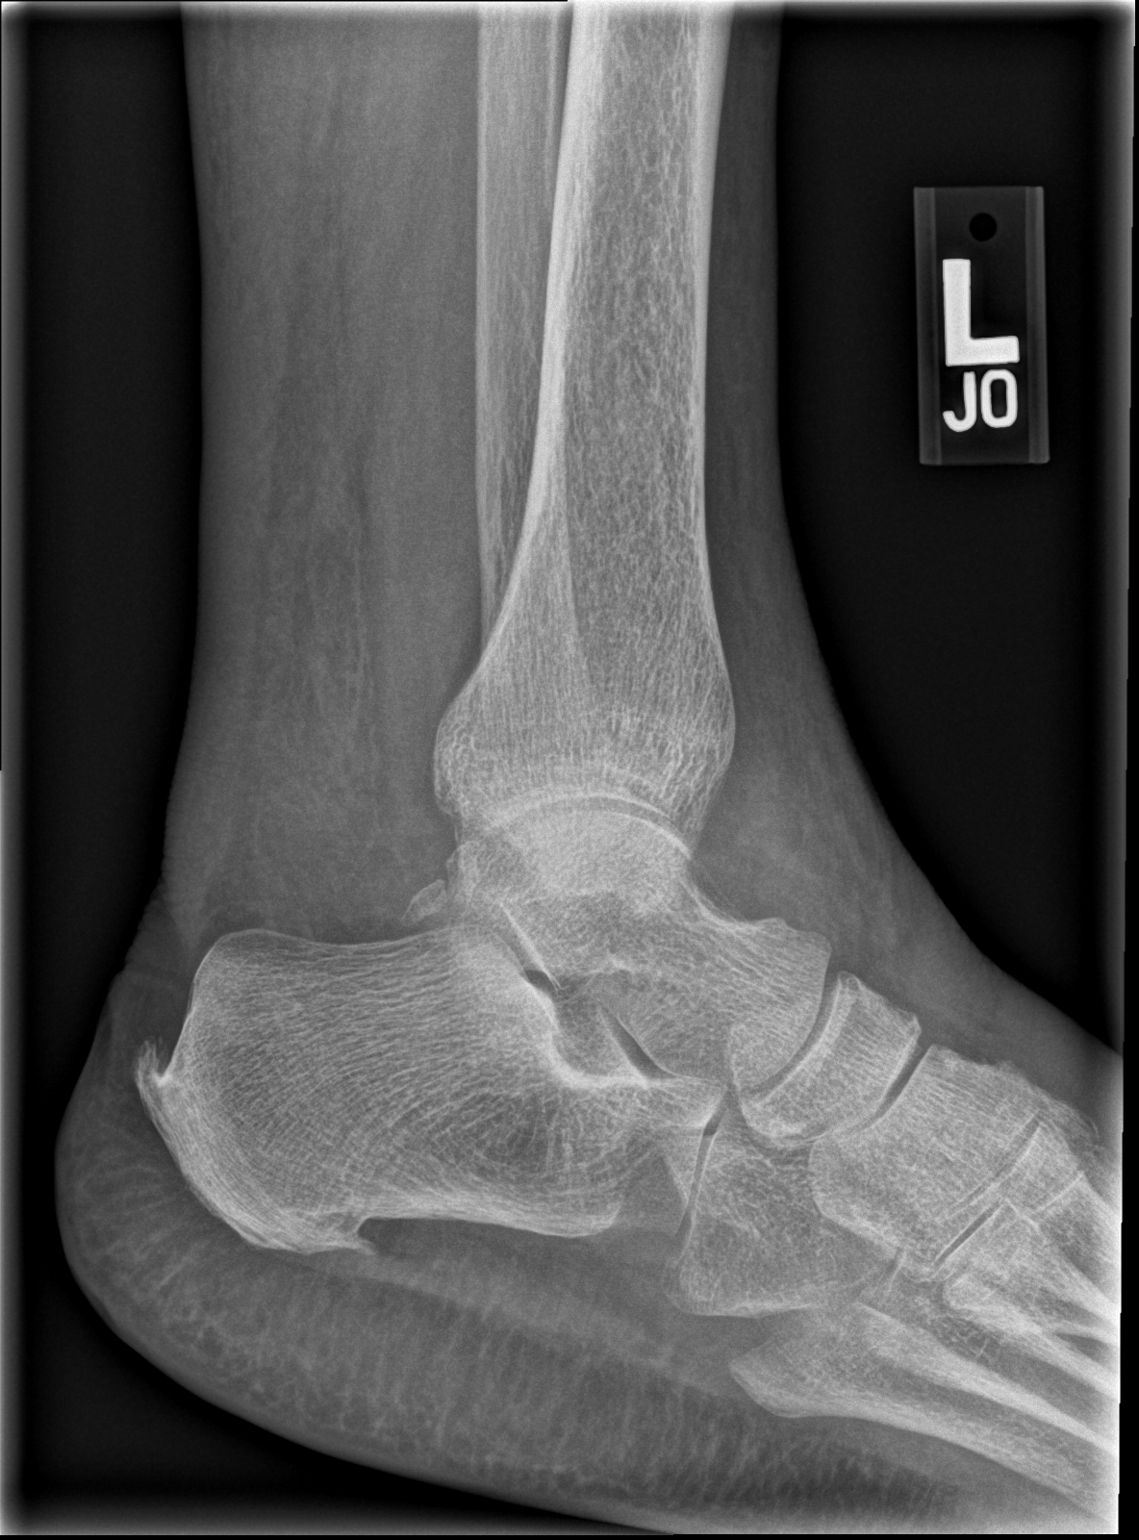

[3 of 3 positions shown; findings below may reference images not displayed]

FINDINGS: There is no evidence of fracture, dislocation, or definite joint
effusion. No osseous erosions, periosteal reaction or bony
destructive change. Small plantar calcaneal spur and Achilles tendon
enthesophyte. Diffuse soft tissue edema most prominent laterally. No
soft tissue calcifications.
IMPRESSION: Diffuse soft tissue edema most prominent laterally. No acute osseous
abnormality or erosive change.

Plantar calcaneal spur and Achilles tendon enthesophyte.

## 2017-11-22 ENCOUNTER — Other Ambulatory Visit: Payer: Self-pay | Admitting: Nurse Practitioner

## 2017-11-22 DIAGNOSIS — J441 Chronic obstructive pulmonary disease with (acute) exacerbation: Secondary | ICD-10-CM

## 2017-11-29 ENCOUNTER — Ambulatory Visit (INDEPENDENT_AMBULATORY_CARE_PROVIDER_SITE_OTHER): Payer: Medicare Other | Admitting: Pulmonary Disease

## 2017-11-29 ENCOUNTER — Encounter: Payer: Self-pay | Admitting: Pulmonary Disease

## 2017-11-29 VITALS — BP 140/78 | HR 80 | Ht 67.0 in | Wt 169.0 lb

## 2017-11-29 DIAGNOSIS — I25110 Atherosclerotic heart disease of native coronary artery with unstable angina pectoris: Secondary | ICD-10-CM | POA: Diagnosis not present

## 2017-11-29 DIAGNOSIS — G4733 Obstructive sleep apnea (adult) (pediatric): Secondary | ICD-10-CM | POA: Diagnosis not present

## 2017-11-29 DIAGNOSIS — I272 Pulmonary hypertension, unspecified: Secondary | ICD-10-CM | POA: Diagnosis not present

## 2017-11-29 DIAGNOSIS — J449 Chronic obstructive pulmonary disease, unspecified: Secondary | ICD-10-CM | POA: Diagnosis not present

## 2017-11-29 NOTE — Progress Notes (Signed)
   Subjective:    Patient ID: Kelly [name], female    DOB: Apr 28, 1931, 83 y.o.   MRN: [id number]  HPI    Review of Systems  Constitutional: Negative for fever and unexpected weight change.  HENT: Negative for congestion, dental problem, ear pain, nosebleeds, postnasal drip, rhinorrhea, sinus pressure, sneezing, sore throat and trouble swallowing.   Eyes: Negative for redness and itching.  Respiratory: Positive for shortness of breath. Negative for cough, chest tightness and wheezing.   Cardiovascular: Positive for chest pain, palpitations and leg swelling.  Gastrointestinal: Negative for nausea and vomiting.  Genitourinary: Negative for dysuria.  Musculoskeletal: Negative for joint swelling.  Skin: Negative for rash.  Allergic/Immunologic: Negative.  Negative for environmental allergies, food allergies and immunocompromised state.  Neurological: Positive for headaches.  Hematological: Does not bruise/bleed easily.  Psychiatric/Behavioral: Negative for dysphoric mood. The patient is nervous/anxious.        Objective:   Physical Exam        Assessment & Plan:

## 2017-11-29 NOTE — Patient Instructions (Signed)
Will schedule home sleep study and pulmonary function test  Follow up in 6 weeks with Dr. Lezly Rumpf or Nurse Practitioner 

## 2017-11-29 NOTE — Progress Notes (Signed)
[facility]  Chief Complaint  Patient presents with  . Sleep consult    Pt referred by Dr. Abbey ChattersJessica Eubanks. Pt moved to [place] from [place] one year ago to live with her children after husband passed. She has had a cpap machine over 81 years old needing to established physician in [place]    Vital signs: BP 140/78 (BP Location: Left Arm, Cuff Size: Normal)   Pulse 80   Ht 5\' 7"  (1.702 m)   Wt 169 lb (76.7 kg)   SpO2 96%   BMI 26.47 kg/m   History of Present Illness: Kelly [name] is a 83 y.o. female for evaluation of sleep problems.  She has a history of COPD and sleep apnea.  She was living in [place] and had testing there.  She moved to [place] about 1 year ago.  She was using CPAP until about 2 months ago.  She ran out of supplies.  She hasn't noticed any difference w/o using CPAP.  Of note is that she has lost about 20 lbs since she had her sleep study.  She is not aware of snoring at night.  She feels weak during the day, but doesn't feel sleepy.  She doesn't take naps.  She goes to sleep at 10 pm.  She falls asleep 30 minutes.  She wakes up 1 or 2 times to use the bathroom.  She gets out of bed at 730 am.  She feels weak in the morning, but doesn't feel like she needs to sleep longer.  She denies morning headache.  She does not use anything to help her fall sleep or stay awake.  She denies sleep walking, sleep talking, bruxism, or nightmares.  There is no history of restless legs.  She denies sleep hallucinations, sleep paralysis, or cataplexy.  The Epworth score is 1 out of 24.  She never smoked cigarettes.  She was told she has asthma.  She is not having cough, wheeze, or sputum.  She gets fatigued after walking about 20 feet.  She used to need oxygen with CPAP, but hasn't used supplemental oxygen recently.  Physical Exam:  General - pleasant Eyes - pupils reactive ENT - no sinus tenderness, no oral exudate, no LAN Cardiac - regular, no murmur Chest  - no wheeze, rales, no dullness Abd - soft, non tender, + bowel sounds Ext - no edema Skin - no rashes Neuro - normal strength, CN intact Psych - normal mood  CMP Latest Ref Rng & Units 10/10/2017 06/10/2017 03/29/2017  Glucose 65 - 99 mg/dL 161(W107(H) 94 87  BUN 7 - 25 mg/dL 96(E44(H) 45(W43(H) 09(W32(H)  Creatinine 0.60 - 0.88 mg/dL 1.19(J1.49(H) 4.78(G1.72(H) 9.56(O1.43(H)  Sodium 135 - 146 mmol/L 146 143 140  Potassium 3.5 - 5.3 mmol/L 3.8 3.8 3.9  Chloride 98 - 110 mmol/L 108 106 101  CO2 20 - 32 mmol/L 28 24 24   Calcium 8.6 - 10.4 mg/dL 9.6 9.1 9.9  Total Protein 6.1 - 8.1 g/dL 6.9 6.4 -  Total Bilirubin 0.2 - 1.2 mg/dL 0.5 0.4 -  Alkaline Phos 33 - 130 U/L - 88 -  AST 10 - 35 U/L 18 69(H) -  ALT 6 - 29 U/L 13 66(H) -    CBC Latest Ref Rng & Units 10/10/2017 06/10/2017 03/23/2017  WBC 3.8 - 10.8 Thousand/uL 6.7 3.4(L) 8.2  Hemoglobin 11.7 - 15.5 g/dL 10.8(L) 11.2(L) 10.3(L)  Hematocrit 35.0 - 45.0 % 32.7(L) 34.8(L) 32.5(L)  Platelets 140 - 400 Thousand/uL  202 146 155    ABG    Component Value Date/Time   PHART 7.488 (H) 01/06/2017 1137   PCO2ART 34.8 01/06/2017 1137   PO2ART 68.0 (L) 01/06/2017 1137   HCO3 29.0 (H) 01/06/2017 1143   TCO2 30 01/06/2017 1143   ACIDBASEDEF 1.0 01/01/2017 1554   O2SAT 38.0 01/06/2017 1143   CXR 03/22/17 >> cardiomegaly with interstitial changes  Spirometry attempted today, but difficulty with test maneuver.   Discussion: 83 yo female with history of COPD and OSA.  She never smoked cigarettes, but was told she had asthma.  She has lost about 20 lbs since original sleep study in [place], and doesn't feel she needs CPAP anymore.  She has systolic CHF and pulmonary hypertension.  Assessment/Plan:  Obstructive sleep apnea. - she has significant weight loss since original set up - will arrange for home sleep study, and then determine if she needs to restart CPAP therapy  COPD. - no history of smoking - will arrange for pulmonary function test to further assess -  continue advair and incruse for now  WHO group 2 and 3 pulmonary hypertension. - will assess overnight oxygen levels with home sleep study  Chronic systolic CHF. - followed by cardiology   Patient Instructions  Will schedule home sleep study and pulmonary function test  Follow up in 6 weeks with Dr. Craige Cotta or Nurse Practitioner    Coralyn Helling, MD [facility] Pulmonary/Critical Care 11/29/2017, 9:50 AM Pager:  [phone]  Flow Sheet  Pulmonary tests:  Sleep tests:  Cardiac tests: Echo 01/03/17 >> EF 35 to 40%, grade 2 DD, PAS 62 mmHg  Review of Systems: Constitutional: Negative for fever and unexpected weight change.  HENT: Negative for congestion, dental problem, ear pain, nosebleeds, postnasal drip, rhinorrhea, sinus pressure, sneezing, sore throat and trouble swallowing.   Eyes: Negative for redness and itching.  Respiratory: Positive for shortness of breath. Negative for cough, chest tightness and wheezing.   Cardiovascular: Positive for chest pain, palpitations and leg swelling.  Gastrointestinal: Negative for nausea and vomiting.  Genitourinary: Negative for dysuria.  Musculoskeletal: Negative for joint swelling.  Skin: Negative for rash.  Allergic/Immunologic: Negative.  Negative for environmental allergies, food allergies and immunocompromised state.  Neurological: Positive for headaches.  Hematological: Does not bruise/bleed easily.  Psychiatric/Behavioral: Negative for dysphoric mood. The patient is nervous/anxious.    Past Medical History: She  has a past medical history of Anemia, Anginal pain (HCC), Aortic stenosis, Arthritis, Asthma, CHF (congestive heart failure) (HCC), CKD (chronic kidney disease), stage III (HCC), COPD (chronic obstructive pulmonary disease) (HCC), Depression, Esophageal disorder, Fibromyalgia, GERD (gastroesophageal reflux disease), Glaucoma (2009), Heart murmur, Hyperlipidemia, Hypertension, LBBB (left bundle branch block), Lumbar disc  disease, Menorrhagia, NSTEMI (non-ST elevated myocardial infarction) (HCC) (1980s X 3; 12/2016), OSA on CPAP, Pneumonia (1980s X 1; 12/2016), Primary Parkinsonism (HCC), Renal insufficiency, Tonsillitis, and Type II diabetes mellitus (HCC).  Past Surgical History: She  has a past surgical history that includes Colonoscopy (2008); Appendectomy (1940); Carpal tunnel release (Right); Lumbar laminectomy (Right, ?2000 - 2003 X 2); Tonsillectomy (1940); Back surgery; Cataract extraction w/ intraocular lens  implant, bilateral (Bilateral); Abdominal hysterectomy (1969); Dilation and curettage of uterus (X 3); and Cardiac catheterization (N/A, 01/06/2017).  Family History: Her family history includes Dementia in her brother; Hypertension in her father; Kidney failure in her mother; Stroke in her father.  Social History: She  reports that  has never smoked. she has never used smokeless tobacco. She reports that she does not drink alcohol  or use drugs.  Medications: Allergies as of 11/29/2017   No Known Allergies     Medication List        Accurate as of 11/29/17  9:50 AM. Always use your most recent med list.          acetaminophen 500 MG tablet Commonly known as:  TYLENOL Take 500 mg by mouth every 6 (six) hours as needed.   aspirin EC 81 MG tablet Take 1 tablet (81 mg total) daily by mouth.   ferrous sulfate 325 (65 FE) MG EC tablet Take 325 mg by mouth daily with breakfast.   Fluticasone-Salmeterol 500-50 MCG/DOSE Aepb Commonly known as:  ADVAIR DISKUS Inhale 1 puff into the lungs 2 (two) times daily.   hydrALAZINE 25 MG tablet Commonly known as:  APRESOLINE TAKE 3 TABLETS (75 MG TOTAL) BY MOUTH EVERY 8 (EIGHT) HOURS.   isosorbide mononitrate 30 MG 24 hr tablet Commonly known as:  IMDUR TAKE 1 TABLET (30 MG TOTAL) BY MOUTH DAILY.   KLOR-CON M20 20 MEQ tablet Generic drug:  potassium chloride SA TAKE 1 TABLET (20 MEQ TOTAL) BY MOUTH DAILY.   pravastatin 40 MG tablet Commonly  known as:  PRAVACHOL Take 1 tablet (40 mg total) by mouth daily.   sertraline 50 MG tablet Commonly known as:  ZOLOFT Take 1 tablet (50 mg total) by mouth daily.   torsemide 20 MG tablet Commonly known as:  DEMADEX TAKE 1 TABLET BY MOUTH EVERY DAY   ULORIC 40 MG tablet Generic drug:  febuxostat TAKE 1 TABLET BY MOUTH EVERY DAY   umeclidinium bromide 62.5 MCG/INH Aepb Commonly known as:  INCRUSE ELLIPTA Inhale 1 puff into the lungs daily.   INCRUSE ELLIPTA 62.5 MCG/INH Aepb Generic drug:  umeclidinium bromide INHALE 1 PUFF INTO THE LUNGS DAILY.   vitamin B-12 1000 MCG tablet Commonly known as:  CYANOCOBALAMIN Take 1,000 mcg by mouth daily.   Vitamin D3 1000 units Caps Take 1,000 Units by mouth daily.

## 2017-12-20 ENCOUNTER — Other Ambulatory Visit: Payer: Self-pay | Admitting: Nurse Practitioner

## 2017-12-20 DIAGNOSIS — I110 Hypertensive heart disease with heart failure: Secondary | ICD-10-CM

## 2018-01-05 ENCOUNTER — Other Ambulatory Visit: Payer: Self-pay | Admitting: Nurse Practitioner

## 2018-01-05 DIAGNOSIS — I110 Hypertensive heart disease with heart failure: Secondary | ICD-10-CM

## 2018-01-05 DIAGNOSIS — I5042 Chronic combined systolic (congestive) and diastolic (congestive) heart failure: Principal | ICD-10-CM

## 2018-01-07 ENCOUNTER — Other Ambulatory Visit: Payer: Self-pay | Admitting: Nurse Practitioner

## 2018-01-07 DIAGNOSIS — F329 Major depressive disorder, single episode, unspecified: Secondary | ICD-10-CM

## 2018-01-07 DIAGNOSIS — F32A Depression, unspecified: Secondary | ICD-10-CM

## 2018-01-09 DIAGNOSIS — G4733 Obstructive sleep apnea (adult) (pediatric): Secondary | ICD-10-CM | POA: Diagnosis not present

## 2018-01-10 ENCOUNTER — Ambulatory Visit: Payer: Medicare Other | Admitting: Pulmonary Disease

## 2018-01-16 NOTE — Telephone Encounter (Signed)
Error..[facility] °

## 2018-01-21 ENCOUNTER — Telehealth: Payer: Self-pay | Admitting: Pulmonary Disease

## 2018-01-21 ENCOUNTER — Other Ambulatory Visit: Payer: Self-pay | Admitting: Nurse Practitioner

## 2018-01-21 DIAGNOSIS — J441 Chronic obstructive pulmonary disease with (acute) exacerbation: Secondary | ICD-10-CM

## 2018-01-21 DIAGNOSIS — G4733 Obstructive sleep apnea (adult) (pediatric): Secondary | ICD-10-CM | POA: Diagnosis not present

## 2018-01-21 NOTE — Telephone Encounter (Signed)
HST 01/09/18 >> AHI 13.6, SaO2 low 77%.   Will have my nurse inform pt that sleep study shows mild sleep apnea.  Options are 1) CPAP now, 2) ROV first.  If Kelly [name] is agreeable to CPAP, then please send order for auto CPAP range 5 to 15 cm H2O with heated humidity and mask of choice.    Kelly [name] has ROV scheduled for 02/09/18.

## 2018-01-23 NOTE — Telephone Encounter (Signed)
Spoke with pt.  She is aware of her results. Pt would like to wait until her upcoming appointment before CPAP therapy is started.

## 2018-01-25 ENCOUNTER — Ambulatory Visit: Payer: Medicare Other | Admitting: Pulmonary Disease

## 2018-01-26 ENCOUNTER — Other Ambulatory Visit: Payer: Self-pay | Admitting: *Deleted

## 2018-01-26 DIAGNOSIS — G4733 Obstructive sleep apnea (adult) (pediatric): Secondary | ICD-10-CM

## 2018-01-31 DIAGNOSIS — H35033 Hypertensive retinopathy, bilateral: Secondary | ICD-10-CM | POA: Diagnosis not present

## 2018-01-31 DIAGNOSIS — H401112 Primary open-angle glaucoma, right eye, moderate stage: Secondary | ICD-10-CM | POA: Diagnosis not present

## 2018-01-31 DIAGNOSIS — H401121 Primary open-angle glaucoma, left eye, mild stage: Secondary | ICD-10-CM | POA: Diagnosis not present

## 2018-02-06 ENCOUNTER — Other Ambulatory Visit: Payer: Medicare Other

## 2018-02-08 ENCOUNTER — Ambulatory Visit: Payer: Medicare Other | Admitting: Pulmonary Disease

## 2018-02-09 ENCOUNTER — Ambulatory Visit: Payer: Medicare Other | Admitting: Nurse Practitioner

## 2018-02-10 ENCOUNTER — Other Ambulatory Visit: Payer: Self-pay | Admitting: Nurse Practitioner

## 2018-02-10 DIAGNOSIS — J441 Chronic obstructive pulmonary disease with (acute) exacerbation: Secondary | ICD-10-CM

## 2018-03-13 ENCOUNTER — Other Ambulatory Visit: Payer: Self-pay | Admitting: Nurse Practitioner

## 2018-03-13 DIAGNOSIS — I110 Hypertensive heart disease with heart failure: Secondary | ICD-10-CM

## 2018-04-03 DIAGNOSIS — R1084 Generalized abdominal pain: Secondary | ICD-10-CM | POA: Diagnosis not present

## 2018-05-04 ENCOUNTER — Telehealth: Payer: Self-pay | Admitting: Nurse Practitioner

## 2018-05-04 NOTE — Telephone Encounter (Signed)
I called the pt to schedule follow up appt w/ Shanda Bumps now and AWV w/ Huntley Dec in August.  I received a message that the number is not in service. VDM (DD)

## 2018-05-06 ENCOUNTER — Other Ambulatory Visit: Payer: Self-pay | Admitting: Cardiology

## 2018-05-06 ENCOUNTER — Other Ambulatory Visit: Payer: Self-pay | Admitting: Nurse Practitioner

## 2018-05-09 ENCOUNTER — Other Ambulatory Visit: Payer: Self-pay | Admitting: *Deleted

## 2018-05-09 DIAGNOSIS — I5042 Chronic combined systolic (congestive) and diastolic (congestive) heart failure: Principal | ICD-10-CM

## 2018-05-09 DIAGNOSIS — I110 Hypertensive heart disease with heart failure: Secondary | ICD-10-CM

## 2018-05-09 MED ORDER — HYDRALAZINE HCL 25 MG PO TABS: 75.0000 mg | ORAL_TABLET | Freq: Three times a day (TID) | ORAL | 0 refills | Status: AC

## 2018-05-09 NOTE — Telephone Encounter (Signed)
[facility].  Patient needs an appointment before any future refills.

## 2018-05-09 NOTE — Telephone Encounter (Signed)
Rx sent to pharmacy   

## 2018-05-18 NOTE — Telephone Encounter (Signed)
2nd attempt - number is still not in service. VDM (DD)

## 2018-06-09 DIAGNOSIS — E119 Type 2 diabetes mellitus without complications: Secondary | ICD-10-CM | POA: Diagnosis not present

## 2018-06-09 DIAGNOSIS — M25562 Pain in left knee: Secondary | ICD-10-CM | POA: Diagnosis not present

## 2018-06-09 DIAGNOSIS — I10 Essential (primary) hypertension: Secondary | ICD-10-CM | POA: Diagnosis not present

## 2018-06-09 DIAGNOSIS — Z6827 Body mass index (BMI) 27.0-27.9, adult: Secondary | ICD-10-CM | POA: Diagnosis not present

## 2018-06-09 DIAGNOSIS — M25561 Pain in right knee: Secondary | ICD-10-CM | POA: Diagnosis not present

## 2018-06-09 DIAGNOSIS — G5602 Carpal tunnel syndrome, left upper limb: Secondary | ICD-10-CM | POA: Diagnosis not present

## 2018-06-11 DIAGNOSIS — Z9089 Acquired absence of other organs: Secondary | ICD-10-CM | POA: Diagnosis not present

## 2018-06-11 DIAGNOSIS — J449 Chronic obstructive pulmonary disease, unspecified: Secondary | ICD-10-CM | POA: Diagnosis present

## 2018-06-11 DIAGNOSIS — I248 Other forms of acute ischemic heart disease: Secondary | ICD-10-CM | POA: Diagnosis present

## 2018-06-11 DIAGNOSIS — I110 Hypertensive heart disease with heart failure: Secondary | ICD-10-CM | POA: Diagnosis not present

## 2018-06-11 DIAGNOSIS — I214 Non-ST elevation (NSTEMI) myocardial infarction: Secondary | ICD-10-CM | POA: Diagnosis not present

## 2018-06-11 DIAGNOSIS — Z6826 Body mass index (BMI) 26.0-26.9, adult: Secondary | ICD-10-CM | POA: Diagnosis not present

## 2018-06-11 DIAGNOSIS — D649 Anemia, unspecified: Secondary | ICD-10-CM | POA: Diagnosis present

## 2018-06-11 DIAGNOSIS — Z833 Family history of diabetes mellitus: Secondary | ICD-10-CM | POA: Diagnosis not present

## 2018-06-11 DIAGNOSIS — Z9849 Cataract extraction status, unspecified eye: Secondary | ICD-10-CM | POA: Diagnosis not present

## 2018-06-11 DIAGNOSIS — I428 Other cardiomyopathies: Secondary | ICD-10-CM | POA: Diagnosis not present

## 2018-06-11 DIAGNOSIS — R109 Unspecified abdominal pain: Secondary | ICD-10-CM | POA: Diagnosis not present

## 2018-06-11 DIAGNOSIS — M1990 Unspecified osteoarthritis, unspecified site: Secondary | ICD-10-CM | POA: Diagnosis present

## 2018-06-11 DIAGNOSIS — G629 Polyneuropathy, unspecified: Secondary | ICD-10-CM | POA: Diagnosis present

## 2018-06-11 DIAGNOSIS — H409 Unspecified glaucoma: Secondary | ICD-10-CM | POA: Diagnosis present

## 2018-06-11 DIAGNOSIS — I2510 Atherosclerotic heart disease of native coronary artery without angina pectoris: Secondary | ICD-10-CM | POA: Diagnosis present

## 2018-06-11 DIAGNOSIS — I509 Heart failure, unspecified: Secondary | ICD-10-CM | POA: Diagnosis not present

## 2018-06-11 DIAGNOSIS — Z9889 Other specified postprocedural states: Secondary | ICD-10-CM | POA: Diagnosis not present

## 2018-06-11 DIAGNOSIS — I5023 Acute on chronic systolic (congestive) heart failure: Secondary | ICD-10-CM | POA: Diagnosis not present

## 2018-06-11 DIAGNOSIS — Z90710 Acquired absence of both cervix and uterus: Secondary | ICD-10-CM | POA: Diagnosis not present

## 2018-06-11 DIAGNOSIS — J45909 Unspecified asthma, uncomplicated: Secondary | ICD-10-CM | POA: Diagnosis present

## 2018-06-11 DIAGNOSIS — Z8249 Family history of ischemic heart disease and other diseases of the circulatory system: Secondary | ICD-10-CM | POA: Diagnosis not present

## 2018-06-11 DIAGNOSIS — I252 Old myocardial infarction: Secondary | ICD-10-CM | POA: Diagnosis not present

## 2018-06-11 DIAGNOSIS — R945 Abnormal results of liver function studies: Secondary | ICD-10-CM | POA: Diagnosis present

## 2018-06-11 DIAGNOSIS — R7989 Other specified abnormal findings of blood chemistry: Secondary | ICD-10-CM | POA: Diagnosis not present

## 2018-06-11 DIAGNOSIS — E669 Obesity, unspecified: Secondary | ICD-10-CM | POA: Diagnosis present

## 2018-06-26 ENCOUNTER — Telehealth: Payer: Self-pay | Admitting: Nurse Practitioner

## 2018-06-26 NOTE — Telephone Encounter (Signed)
I left a message with pt's son, Alinda Money, asking him to call me at [phone] and let me know if we can schedule an appt.  I have been unable to get through to number listed for pt and her son Renata Caprice. VDM (DD)

## 2018-08-01 ENCOUNTER — Other Ambulatory Visit: Payer: Self-pay | Admitting: Nurse Practitioner

## 2018-08-01 DIAGNOSIS — F329 Major depressive disorder, single episode, unspecified: Secondary | ICD-10-CM

## 2018-08-01 DIAGNOSIS — F32A Depression, unspecified: Secondary | ICD-10-CM

## 2018-08-16 ENCOUNTER — Other Ambulatory Visit: Payer: Self-pay | Admitting: Nurse Practitioner

## 2018-08-16 DIAGNOSIS — I110 Hypertensive heart disease with heart failure: Secondary | ICD-10-CM

## 2018-09-29 ENCOUNTER — Telehealth: Payer: Self-pay

## 2018-09-29 NOTE — Telephone Encounter (Signed)
A medication refill request was received for sertraline 50 mg tablets from [facility] in [facility]. I attempted to call patient at numbers listed in chart but it was disconnected. I called the number listed on the medication refill request [phone] to find out if patient has moved back to [place].    I left a message asking that patient call the office. Refill request is on hold until verification can be made that patient has not moved.

## 2018-10-05 NOTE — Telephone Encounter (Signed)
I called the patient but there was no answer and no message picked up.

## 2018-10-10 NOTE — Telephone Encounter (Signed)
I left a message for patient to call the office to let us know if she has moved back to [place].

## 2018-10-25 ENCOUNTER — Other Ambulatory Visit: Payer: Self-pay | Admitting: Internal Medicine

## 2018-10-25 DIAGNOSIS — I110 Hypertensive heart disease with heart failure: Secondary | ICD-10-CM

## 2019-06-25 ENCOUNTER — Other Ambulatory Visit: Payer: Self-pay | Admitting: Internal Medicine

## 2019-06-25 DIAGNOSIS — I110 Hypertensive heart disease with heart failure: Secondary | ICD-10-CM

## 2019-06-25 NOTE — Telephone Encounter (Signed)
Called home number x 3, number invalid. Called patient son Heloise Purpura on his mobile number, recording stated caller was unavailable and to try again later.   Patient last seen 2018, we have made several attempts since then contact patient and family to schedule an appointment.  Request came from a pharmacy in [place].   RX denied, patient needs to contact us prior to refill.

## 2020-07-13 DEATH — deceased
